# Patient Record
Sex: Female | Born: 1940
Health system: Southern US, Community
[De-identification: ages and names within clinical notes are randomized; demographics above are authoritative.]

## PROBLEM LIST (undated history)

## (undated) DIAGNOSIS — M1990 Unspecified osteoarthritis, unspecified site: Secondary | ICD-10-CM

## (undated) DIAGNOSIS — Z951 Presence of aortocoronary bypass graft: Secondary | ICD-10-CM

## (undated) DIAGNOSIS — C9590 Leukemia, unspecified not having achieved remission: Secondary | ICD-10-CM

## (undated) DIAGNOSIS — M810 Age-related osteoporosis without current pathological fracture: Secondary | ICD-10-CM

## (undated) DIAGNOSIS — Z86010 Personal history of colonic polyps: Secondary | ICD-10-CM

## (undated) DIAGNOSIS — I10 Essential (primary) hypertension: Secondary | ICD-10-CM

## (undated) DIAGNOSIS — R5382 Chronic fatigue, unspecified: Secondary | ICD-10-CM

## (undated) DIAGNOSIS — I2510 Atherosclerotic heart disease of native coronary artery without angina pectoris: Secondary | ICD-10-CM

## (undated) DIAGNOSIS — K219 Gastro-esophageal reflux disease without esophagitis: Secondary | ICD-10-CM

## (undated) DIAGNOSIS — E785 Hyperlipidemia, unspecified: Secondary | ICD-10-CM

## (undated) DIAGNOSIS — I200 Unstable angina: Secondary | ICD-10-CM

## (undated) HISTORY — PX: COLONOSCOPY: SHX174

## (undated) HISTORY — PX: CHOLECYSTECTOMY: SHX55

## (undated) HISTORY — PX: CATARACT EXTRACTION: SUR2

## (undated) HISTORY — PX: ESOPHAGOGASTRODUODENOSCOPY: SHX1529

## (undated) HISTORY — PX: APPENDECTOMY: SHX54

## (undated) HISTORY — DX: Leukemia, unspecified not having achieved remission: C95.90

---

## 2004-01-07 ENCOUNTER — Ambulatory Visit: Payer: Self-pay | Admitting: Internal Medicine

## 2004-01-13 ENCOUNTER — Ambulatory Visit: Payer: Self-pay | Admitting: Internal Medicine

## 2004-09-23 ENCOUNTER — Ambulatory Visit: Payer: Self-pay | Admitting: Obstetrics and Gynecology

## 2005-06-25 ENCOUNTER — Ambulatory Visit: Payer: Self-pay | Admitting: Internal Medicine

## 2005-09-27 ENCOUNTER — Ambulatory Visit: Payer: Self-pay | Admitting: Obstetrics and Gynecology

## 2006-04-25 ENCOUNTER — Ambulatory Visit: Payer: Self-pay | Admitting: Unknown Physician Specialty

## 2006-10-02 ENCOUNTER — Ambulatory Visit: Payer: Self-pay | Admitting: Obstetrics and Gynecology

## 2007-10-04 ENCOUNTER — Ambulatory Visit: Payer: Self-pay | Admitting: Obstetrics and Gynecology

## 2008-10-10 ENCOUNTER — Ambulatory Visit: Payer: Self-pay | Admitting: Obstetrics and Gynecology

## 2008-11-20 ENCOUNTER — Ambulatory Visit: Payer: Self-pay | Admitting: Unknown Physician Specialty

## 2008-12-17 ENCOUNTER — Encounter: Payer: Self-pay | Admitting: Unknown Physician Specialty

## 2009-01-17 ENCOUNTER — Encounter: Payer: Self-pay | Admitting: Unknown Physician Specialty

## 2009-04-20 ENCOUNTER — Observation Stay: Payer: Self-pay | Admitting: Internal Medicine

## 2009-10-12 ENCOUNTER — Ambulatory Visit: Payer: Self-pay | Admitting: Obstetrics and Gynecology

## 2010-11-23 ENCOUNTER — Ambulatory Visit: Payer: Self-pay | Admitting: Obstetrics and Gynecology

## 2011-08-01 ENCOUNTER — Ambulatory Visit: Payer: Self-pay | Admitting: Unknown Physician Specialty

## 2011-08-02 LAB — PATHOLOGY REPORT

## 2011-11-24 ENCOUNTER — Ambulatory Visit: Payer: Self-pay | Admitting: Obstetrics and Gynecology

## 2012-12-02 ENCOUNTER — Ambulatory Visit: Payer: Self-pay | Admitting: Surgery

## 2012-12-02 LAB — CBC
HCT: 38.4 % (ref 35.0–47.0)
HGB: 13.2 g/dL (ref 12.0–16.0)
MCH: 30 pg (ref 26.0–34.0)
MCHC: 34.3 g/dL (ref 32.0–36.0)
Platelet: 271 10*3/uL (ref 150–440)
RBC: 4.39 10*6/uL (ref 3.80–5.20)

## 2012-12-02 LAB — URINALYSIS, COMPLETE
Bacteria: NONE SEEN
Bilirubin,UR: NEGATIVE
Ketone: NEGATIVE
Ph: 7 (ref 4.5–8.0)
Protein: NEGATIVE
WBC UR: <1 /HPF (ref 0–5)

## 2012-12-02 LAB — COMPREHENSIVE METABOLIC PANEL
Albumin: 4.3 g/dL (ref 3.4–5.0)
Alkaline Phosphatase: 113 U/L (ref 50–136)
Anion Gap: 6 — ABNORMAL LOW (ref 7–16)
Co2: 26 mmol/L (ref 21–32)
Creatinine: 0.82 mg/dL (ref 0.60–1.30)
EGFR (African American): >60
Osmolality: 271 (ref 275–301)
Potassium: 3.8 mmol/L (ref 3.5–5.1)
SGPT (ALT): 31 U/L (ref 12–78)
Sodium: 135 mmol/L — ABNORMAL LOW (ref 136–145)
Total Protein: 8.5 g/dL — ABNORMAL HIGH (ref 6.4–8.2)

## 2012-12-04 LAB — PATHOLOGY REPORT

## 2013-09-24 DIAGNOSIS — K2100 Gastro-esophageal reflux disease with esophagitis, without bleeding: Secondary | ICD-10-CM | POA: Insufficient documentation

## 2013-09-24 DIAGNOSIS — E785 Hyperlipidemia, unspecified: Secondary | ICD-10-CM | POA: Insufficient documentation

## 2013-09-24 DIAGNOSIS — M810 Age-related osteoporosis without current pathological fracture: Secondary | ICD-10-CM | POA: Insufficient documentation

## 2013-09-24 DIAGNOSIS — G9332 Myalgic encephalomyelitis/chronic fatigue syndrome: Secondary | ICD-10-CM | POA: Insufficient documentation

## 2013-09-24 DIAGNOSIS — R5382 Chronic fatigue, unspecified: Secondary | ICD-10-CM | POA: Insufficient documentation

## 2013-09-24 DIAGNOSIS — I10 Essential (primary) hypertension: Secondary | ICD-10-CM | POA: Insufficient documentation

## 2014-02-03 DIAGNOSIS — K648 Other hemorrhoids: Secondary | ICD-10-CM | POA: Diagnosis not present

## 2014-03-05 DIAGNOSIS — Z01419 Encounter for gynecological examination (general) (routine) without abnormal findings: Secondary | ICD-10-CM | POA: Diagnosis not present

## 2014-03-06 DIAGNOSIS — Z961 Presence of intraocular lens: Secondary | ICD-10-CM | POA: Diagnosis not present

## 2014-03-07 ENCOUNTER — Ambulatory Visit: Payer: Self-pay | Admitting: Obstetrics and Gynecology

## 2014-03-07 DIAGNOSIS — Z1231 Encounter for screening mammogram for malignant neoplasm of breast: Secondary | ICD-10-CM | POA: Diagnosis not present

## 2014-03-10 DIAGNOSIS — E785 Hyperlipidemia, unspecified: Secondary | ICD-10-CM | POA: Diagnosis not present

## 2014-03-10 DIAGNOSIS — R203 Hyperesthesia: Secondary | ICD-10-CM | POA: Diagnosis not present

## 2014-03-10 DIAGNOSIS — R5382 Chronic fatigue, unspecified: Secondary | ICD-10-CM | POA: Diagnosis not present

## 2014-03-10 DIAGNOSIS — L02212 Cutaneous abscess of back [any part, except buttock]: Secondary | ICD-10-CM | POA: Diagnosis not present

## 2014-03-17 DIAGNOSIS — K648 Other hemorrhoids: Secondary | ICD-10-CM | POA: Diagnosis not present

## 2014-03-19 DIAGNOSIS — R102 Pelvic and perineal pain: Secondary | ICD-10-CM | POA: Diagnosis not present

## 2014-03-20 DIAGNOSIS — E780 Pure hypercholesterolemia: Secondary | ICD-10-CM | POA: Diagnosis not present

## 2014-03-20 DIAGNOSIS — I10 Essential (primary) hypertension: Secondary | ICD-10-CM | POA: Diagnosis not present

## 2014-03-20 DIAGNOSIS — H9193 Unspecified hearing loss, bilateral: Secondary | ICD-10-CM | POA: Diagnosis not present

## 2014-03-20 DIAGNOSIS — Z23 Encounter for immunization: Secondary | ICD-10-CM | POA: Diagnosis not present

## 2014-03-20 DIAGNOSIS — Z0001 Encounter for general adult medical examination with abnormal findings: Secondary | ICD-10-CM | POA: Diagnosis not present

## 2014-03-20 DIAGNOSIS — R5382 Chronic fatigue, unspecified: Secondary | ICD-10-CM | POA: Diagnosis not present

## 2014-04-17 DIAGNOSIS — H26492 Other secondary cataract, left eye: Secondary | ICD-10-CM | POA: Diagnosis not present

## 2014-04-21 DIAGNOSIS — H6123 Impacted cerumen, bilateral: Secondary | ICD-10-CM | POA: Diagnosis not present

## 2014-04-21 DIAGNOSIS — H903 Sensorineural hearing loss, bilateral: Secondary | ICD-10-CM | POA: Diagnosis not present

## 2014-04-30 DIAGNOSIS — M546 Pain in thoracic spine: Secondary | ICD-10-CM | POA: Diagnosis not present

## 2014-05-09 NOTE — Op Note (Signed)
PATIENT NAME:  Sheena Rice, CZERWINSKI MR#:  [id number] DATE OF BIRTH:  1940-11-22  DATE OF PROCEDURE:  12/02/2012  PREOPERATIVE DIAGNOSIS: Acute appendicitis.   POSTOPERATIVE DIAGNOSIS: Acute appendicitis.   PROCEDURE: Laparoscopic appendectomy.   SURGEON: Rochel Brome, M.D.   ANESTHESIA: General.   INDICATIONS: This 74 year old female was admitted emergently with acute onset of abdominal pain with right lower quadrant tenderness, leukocytosis, CT findings of appendicitis. Surgery was recommended for definitive treatment.   DESCRIPTION OF PROCEDURE: The patient was placed on the operating table in the supine position under general endotracheal anesthesia. The abdomen was prepared with ChloraPrep and draped in a sterile manner.   A short incision was made in the inferior aspect of the umbilicus and carried down to the deep fascia which was grasped with laryngeal hook and elevated. A Veress needle was inserted, aspirated and irrigated with a saline solution. Next, the peritoneal cavity was inflated with carbon dioxide. The Veress needle was removed, the 10 mm cannula was inserted. The 10 mm, 0 degrees laparoscope was inserted to view the peritoneal cavity. The abdominal wall was somewhat thin, the liver appeared normal. The cecum was identified. Another incision was made in the left lower quadrant lateral to the inferior epigastric vessels to insert a 12 mm cannula. Another incision was made in the right lower quadrant lateral to the inferior epigastric vessels to insert a 5 mm cannula.   The patient was turned several degrees to the left. The cecum was mobilized with incision of the peritoneal reflection. The cecum was retracted in a cephalad direction, exposing an acutely inflamed appendix. The terminal ileum appeared normal and was adherent to the appendix and these adhesions were taken down with blunt and sharp dissection. A window was created in the mesoappendix adjacent to the junction of the appendix  and the cecum. The blue cartridge Endo GIA stapler was placed across the appendix adjacent to the cecum, engaged and activated,  separating the appendix from the cecum. Staple lines appeared to be hemostatic. Next, the white cartridge and Endo GIA stapler was placed across the mesoappendix, engaged and activated, dividing the mesoappendix. There was just a scant bit of blood momentarily which bled and this was aspirated. Hemostasis was subsequently intact. The appendix was brought out through the 12 mm port site. It did appear to be acutely inflamed but was not ruptured and was submitted in formalin for routine pathology.   Pressure was held over the 12 mm port site, making an air seal and the right lower quadrant was further inspected. Hemostasis was intact. Next, the other cannulas were removed, allowing carbon dioxide to escape from the peritoneal cavity. The fascial defect at the umbilicus and in the left lower quadrant were each repaired with 3-0 Vicryl figure-of-eight sutures. The 3 skin incisions were then closed with interrupted 5-0 chromic subcuticular sutures, benzoin and Steri-Strips. Dressings were applied with paper tape. The patient tolerated surgery satisfactorily and was then moved to the recovery room for postoperative care.   ____________________________ J. Rochel Brome, MD jws:cs D: 12/02/2012 20:26:26 ET T: 12/02/2012 20:33:14 ET JOB#: [id number]  cc: Loreli Dollar, MD, <Dictator> Loreli Dollar MD ELECTRONICALLY SIGNED 12/03/2012 17:14

## 2014-05-12 DIAGNOSIS — L720 Epidermal cyst: Secondary | ICD-10-CM | POA: Diagnosis not present

## 2014-06-14 DIAGNOSIS — N390 Urinary tract infection, site not specified: Secondary | ICD-10-CM | POA: Diagnosis not present

## 2014-09-18 DIAGNOSIS — E780 Pure hypercholesterolemia: Secondary | ICD-10-CM | POA: Diagnosis not present

## 2014-09-18 DIAGNOSIS — I10 Essential (primary) hypertension: Secondary | ICD-10-CM | POA: Diagnosis not present

## 2014-09-25 DIAGNOSIS — R5382 Chronic fatigue, unspecified: Secondary | ICD-10-CM | POA: Diagnosis not present

## 2014-09-25 DIAGNOSIS — E780 Pure hypercholesterolemia: Secondary | ICD-10-CM | POA: Diagnosis not present

## 2014-09-25 DIAGNOSIS — I10 Essential (primary) hypertension: Secondary | ICD-10-CM | POA: Diagnosis not present

## 2014-10-19 DIAGNOSIS — R300 Dysuria: Secondary | ICD-10-CM | POA: Diagnosis not present

## 2014-10-19 DIAGNOSIS — N390 Urinary tract infection, site not specified: Secondary | ICD-10-CM | POA: Diagnosis not present

## 2014-11-17 DIAGNOSIS — L300 Nummular dermatitis: Secondary | ICD-10-CM | POA: Diagnosis not present

## 2014-11-17 DIAGNOSIS — L814 Other melanin hyperpigmentation: Secondary | ICD-10-CM | POA: Diagnosis not present

## 2014-11-17 DIAGNOSIS — L820 Inflamed seborrheic keratosis: Secondary | ICD-10-CM | POA: Diagnosis not present

## 2014-11-17 DIAGNOSIS — D485 Neoplasm of uncertain behavior of skin: Secondary | ICD-10-CM | POA: Diagnosis not present

## 2014-11-17 DIAGNOSIS — D1800 Hemangioma unspecified site: Secondary | ICD-10-CM | POA: Diagnosis not present

## 2014-11-17 DIAGNOSIS — D225 Melanocytic nevi of trunk: Secondary | ICD-10-CM | POA: Diagnosis not present

## 2014-11-17 DIAGNOSIS — Z1283 Encounter for screening for malignant neoplasm of skin: Secondary | ICD-10-CM | POA: Diagnosis not present

## 2014-11-17 DIAGNOSIS — D229 Melanocytic nevi, unspecified: Secondary | ICD-10-CM | POA: Diagnosis not present

## 2014-11-17 DIAGNOSIS — L821 Other seborrheic keratosis: Secondary | ICD-10-CM | POA: Diagnosis not present

## 2014-12-08 DIAGNOSIS — J019 Acute sinusitis, unspecified: Secondary | ICD-10-CM | POA: Diagnosis not present

## 2014-12-26 DIAGNOSIS — J042 Acute laryngotracheitis: Secondary | ICD-10-CM | POA: Diagnosis not present

## 2015-02-05 DIAGNOSIS — Z0100 Encounter for examination of eyes and vision without abnormal findings: Secondary | ICD-10-CM | POA: Diagnosis not present

## 2015-02-05 DIAGNOSIS — H524 Presbyopia: Secondary | ICD-10-CM | POA: Diagnosis not present

## 2015-02-05 DIAGNOSIS — H5210 Myopia, unspecified eye: Secondary | ICD-10-CM | POA: Diagnosis not present

## 2015-03-19 DIAGNOSIS — E7800 Pure hypercholesterolemia, unspecified: Secondary | ICD-10-CM | POA: Diagnosis not present

## 2015-03-19 DIAGNOSIS — R5382 Chronic fatigue, unspecified: Secondary | ICD-10-CM | POA: Diagnosis not present

## 2015-03-26 DIAGNOSIS — E7800 Pure hypercholesterolemia, unspecified: Secondary | ICD-10-CM | POA: Diagnosis not present

## 2015-03-26 DIAGNOSIS — Z0001 Encounter for general adult medical examination with abnormal findings: Secondary | ICD-10-CM | POA: Diagnosis not present

## 2015-03-26 DIAGNOSIS — R5382 Chronic fatigue, unspecified: Secondary | ICD-10-CM | POA: Diagnosis not present

## 2015-03-26 DIAGNOSIS — N390 Urinary tract infection, site not specified: Secondary | ICD-10-CM | POA: Diagnosis not present

## 2015-03-26 DIAGNOSIS — I10 Essential (primary) hypertension: Secondary | ICD-10-CM | POA: Diagnosis not present

## 2015-05-21 ENCOUNTER — Other Ambulatory Visit: Payer: Self-pay | Admitting: Obstetrics and Gynecology

## 2015-05-21 DIAGNOSIS — N6314 Unspecified lump in the right breast, lower inner quadrant: Secondary | ICD-10-CM

## 2015-05-21 DIAGNOSIS — Z01411 Encounter for gynecological examination (general) (routine) with abnormal findings: Secondary | ICD-10-CM | POA: Diagnosis not present

## 2015-05-21 DIAGNOSIS — Z124 Encounter for screening for malignant neoplasm of cervix: Secondary | ICD-10-CM | POA: Diagnosis not present

## 2015-05-28 DIAGNOSIS — M8588 Other specified disorders of bone density and structure, other site: Secondary | ICD-10-CM | POA: Diagnosis not present

## 2015-05-29 ENCOUNTER — Ambulatory Visit
Admission: RE | Admit: 2015-05-29 | Discharge: 2015-05-29 | Disposition: A | Discharge status: Home / Self Care | Payer: Commercial Managed Care - HMO | Source: Ambulatory Visit | Attending: Obstetrics and Gynecology | Admitting: Obstetrics and Gynecology

## 2015-05-29 DIAGNOSIS — N63 Unspecified lump in breast: Secondary | ICD-10-CM | POA: Diagnosis not present

## 2015-05-29 DIAGNOSIS — N6314 Unspecified lump in the right breast, lower inner quadrant: Secondary | ICD-10-CM

## 2015-05-29 DIAGNOSIS — N6489 Other specified disorders of breast: Secondary | ICD-10-CM | POA: Diagnosis not present

## 2015-05-29 DIAGNOSIS — R928 Other abnormal and inconclusive findings on diagnostic imaging of breast: Secondary | ICD-10-CM | POA: Diagnosis not present

## 2015-09-28 DIAGNOSIS — R5382 Chronic fatigue, unspecified: Secondary | ICD-10-CM | POA: Diagnosis not present

## 2015-09-28 DIAGNOSIS — N390 Urinary tract infection, site not specified: Secondary | ICD-10-CM | POA: Diagnosis not present

## 2015-09-28 DIAGNOSIS — I10 Essential (primary) hypertension: Secondary | ICD-10-CM | POA: Diagnosis not present

## 2015-10-08 DIAGNOSIS — H903 Sensorineural hearing loss, bilateral: Secondary | ICD-10-CM | POA: Diagnosis not present

## 2015-10-08 DIAGNOSIS — H6123 Impacted cerumen, bilateral: Secondary | ICD-10-CM | POA: Diagnosis not present

## 2015-11-24 DIAGNOSIS — L816 Other disorders of diminished melanin formation: Secondary | ICD-10-CM | POA: Diagnosis not present

## 2015-11-24 DIAGNOSIS — D229 Melanocytic nevi, unspecified: Secondary | ICD-10-CM | POA: Diagnosis not present

## 2015-11-24 DIAGNOSIS — D1800 Hemangioma unspecified site: Secondary | ICD-10-CM | POA: Diagnosis not present

## 2015-11-24 DIAGNOSIS — B009 Herpesviral infection, unspecified: Secondary | ICD-10-CM | POA: Diagnosis not present

## 2015-11-24 DIAGNOSIS — L820 Inflamed seborrheic keratosis: Secondary | ICD-10-CM | POA: Diagnosis not present

## 2015-11-24 DIAGNOSIS — Z1283 Encounter for screening for malignant neoplasm of skin: Secondary | ICD-10-CM | POA: Diagnosis not present

## 2015-11-24 DIAGNOSIS — L814 Other melanin hyperpigmentation: Secondary | ICD-10-CM | POA: Diagnosis not present

## 2015-11-24 DIAGNOSIS — I788 Other diseases of capillaries: Secondary | ICD-10-CM | POA: Diagnosis not present

## 2015-11-24 DIAGNOSIS — L821 Other seborrheic keratosis: Secondary | ICD-10-CM | POA: Diagnosis not present

## 2015-12-28 ENCOUNTER — Other Ambulatory Visit
Admission: RE | Admit: 2015-12-28 | Discharge: 2015-12-28 | Disposition: A | Discharge status: Home / Self Care | Payer: Commercial Managed Care - HMO | Source: Ambulatory Visit | Attending: Cardiology | Admitting: Cardiology

## 2015-12-28 ENCOUNTER — Other Ambulatory Visit: Payer: Self-pay | Admitting: Cardiology

## 2015-12-28 DIAGNOSIS — E7800 Pure hypercholesterolemia, unspecified: Secondary | ICD-10-CM | POA: Diagnosis not present

## 2015-12-28 DIAGNOSIS — R5382 Chronic fatigue, unspecified: Secondary | ICD-10-CM | POA: Diagnosis not present

## 2015-12-28 DIAGNOSIS — R079 Chest pain, unspecified: Secondary | ICD-10-CM | POA: Insufficient documentation

## 2015-12-28 DIAGNOSIS — I10 Essential (primary) hypertension: Secondary | ICD-10-CM | POA: Diagnosis not present

## 2015-12-28 DIAGNOSIS — Z0000 Encounter for general adult medical examination without abnormal findings: Secondary | ICD-10-CM | POA: Diagnosis not present

## 2015-12-28 LAB — TROPONIN I: TROPONIN I: 0.04 ng/mL — AB (ref <0.03)

## 2015-12-28 MED ORDER — SODIUM CHLORIDE 0.9% FLUSH: 3.0000 mL | Freq: Two times a day (BID) | INTRAVENOUS | Status: AC

## 2015-12-28 MED ORDER — SODIUM CHLORIDE 0.9% FLUSH: 3.0000 mL | INTRAVENOUS | Status: AC | PRN

## 2015-12-28 MED ORDER — ASPIRIN 81 MG PO CHEW: 81.0000 mg | CHEWABLE_TABLET | ORAL | Status: AC

## 2015-12-28 MED ORDER — SODIUM CHLORIDE 0.9 % IV SOLN
250.0000 mL | INTRAVENOUS | Status: AC | PRN
  Administered 2017-04-28: 08:00:00 via INTRAVENOUS

## 2015-12-28 MED ORDER — SODIUM CHLORIDE 0.9 % WEIGHT BASED INFUSION: 3.0000 mL/kg/h | INTRAVENOUS | Status: AC

## 2015-12-28 MED ORDER — SODIUM CHLORIDE 0.9 % WEIGHT BASED INFUSION: 1.0000 mL/kg/h | INTRAVENOUS | Status: AC

## 2015-12-29 ENCOUNTER — Observation Stay
Admission: RE | Admit: 2015-12-29 | Discharge: 2015-12-30 | Disposition: A | Discharge status: Other Acute Inpatient Hospital | Payer: Commercial Managed Care - HMO | Source: Ambulatory Visit | Attending: Cardiology | Admitting: Cardiology

## 2015-12-29 ENCOUNTER — Encounter: Payer: Self-pay | Admitting: *Deleted

## 2015-12-29 ENCOUNTER — Encounter
Admission: RE | Disposition: A | Discharge status: Other Acute Inpatient Hospital | Payer: Self-pay | Source: Ambulatory Visit | Attending: Cardiology

## 2015-12-29 DIAGNOSIS — I25110 Atherosclerotic heart disease of native coronary artery with unstable angina pectoris: Secondary | ICD-10-CM | POA: Diagnosis not present

## 2015-12-29 DIAGNOSIS — I200 Unstable angina: Secondary | ICD-10-CM | POA: Diagnosis not present

## 2015-12-29 DIAGNOSIS — Z79899 Other long term (current) drug therapy: Secondary | ICD-10-CM | POA: Diagnosis not present

## 2015-12-29 DIAGNOSIS — R079 Chest pain, unspecified: Secondary | ICD-10-CM | POA: Diagnosis present

## 2015-12-29 DIAGNOSIS — Z9049 Acquired absence of other specified parts of digestive tract: Secondary | ICD-10-CM | POA: Diagnosis not present

## 2015-12-29 HISTORY — PX: CARDIAC CATHETERIZATION: SHX172

## 2015-12-29 LAB — TROPONIN I: Troponin I: 0.04 ng/mL (ref <0.03)

## 2015-12-29 LAB — CBC
HEMATOCRIT: 37.1 % (ref 35.0–47.0)
Hemoglobin: 12.9 g/dL (ref 12.0–16.0)
MCH: 30.5 pg (ref 26.0–34.0)
MCHC: 34.6 g/dL (ref 32.0–36.0)
MCV: 87.9 fL (ref 80.0–100.0)
Platelets: 222 10*3/uL (ref 150–440)
RBC: 4.22 MIL/uL (ref 3.80–5.20)
RDW: 12.6 % (ref 11.5–14.5)
WBC: 6.2 10*3/uL (ref 3.6–11.0)

## 2015-12-29 LAB — PROTIME-INR
INR: 1.07
Prothrombin Time: 13.9 seconds (ref 11.4–15.2)

## 2015-12-29 LAB — APTT: aPTT: 29 seconds (ref 24–36)

## 2015-12-29 SURGERY — LEFT HEART CATH AND CORONARY ANGIOGRAPHY
Anesthesia: Moderate Sedation | Laterality: Left

## 2015-12-29 MED ORDER — NITROGLYCERIN 0.4 MG SL SUBL
SUBLINGUAL_TABLET | SUBLINGUAL | Status: AC
  Filled 2015-12-29: qty 1

## 2015-12-29 MED ORDER — SODIUM CHLORIDE 0.9 % IV SOLN: 250.0000 mL | INTRAVENOUS | Status: DC | PRN

## 2015-12-29 MED ORDER — ACETAMINOPHEN 325 MG PO TABS: 650.0000 mg | ORAL_TABLET | Freq: Four times a day (QID) | ORAL | Status: DC | PRN

## 2015-12-29 MED ORDER — METOPROLOL TARTRATE 25 MG PO TABS
12.5000 mg | ORAL_TABLET | Freq: Two times a day (BID) | ORAL | Status: DC
  Administered 2015-12-29: 12.5 mg via ORAL
  Filled 2015-12-29 (×2): qty 1

## 2015-12-29 MED ORDER — ALPRAZOLAM 0.5 MG PO TABS: 0.2500 mg | ORAL_TABLET | Freq: Two times a day (BID) | ORAL | Status: DC | PRN

## 2015-12-29 MED ORDER — ZOLPIDEM TARTRATE 5 MG PO TABS: 5.0000 mg | ORAL_TABLET | Freq: Every evening | ORAL | Status: DC | PRN

## 2015-12-29 MED ORDER — FENTANYL CITRATE (PF) 100 MCG/2ML IJ SOLN
INTRAMUSCULAR | Status: DC | PRN
  Administered 2015-12-29: 12.5 ug via INTRAVENOUS

## 2015-12-29 MED ORDER — HEPARIN BOLUS VIA INFUSION
3600.0000 [IU] | Freq: Once | INTRAVENOUS | Status: AC
  Administered 2015-12-29: 3600 [IU] via INTRAVENOUS
  Filled 2015-12-29: qty 3600

## 2015-12-29 MED ORDER — ONDANSETRON HCL 4 MG/2ML IJ SOLN: 4.0000 mg | Freq: Four times a day (QID) | INTRAMUSCULAR | Status: DC | PRN

## 2015-12-29 MED ORDER — IOPAMIDOL (ISOVUE-300) INJECTION 61%
INTRAVENOUS | Status: DC | PRN
Start: 2015-12-29 — End: 2015-12-29
  Administered 2015-12-29: 175 mL via INTRA_ARTERIAL

## 2015-12-29 MED ORDER — SODIUM CHLORIDE 0.9% FLUSH: 3.0000 mL | INTRAVENOUS | Status: DC | PRN

## 2015-12-29 MED ORDER — SODIUM CHLORIDE 0.9 % WEIGHT BASED INFUSION: 1.0000 mL/kg/h | INTRAVENOUS | Status: AC

## 2015-12-29 MED ORDER — SODIUM CHLORIDE 0.9% FLUSH
3.0000 mL | Freq: Two times a day (BID) | INTRAVENOUS | Status: DC
  Administered 2015-12-29: 3 mL via INTRAVENOUS

## 2015-12-29 MED ORDER — ASPIRIN 81 MG PO CHEW
81.0000 mg | CHEWABLE_TABLET | Freq: Once | ORAL | Status: AC
  Administered 2015-12-29: 81 mg via ORAL

## 2015-12-29 MED ORDER — HEPARIN (PORCINE) IN NACL 100-0.45 UNIT/ML-% IJ SOLN
700.0000 [IU]/h | INTRAMUSCULAR | Status: DC
  Administered 2015-12-29: 700 [IU]/h via INTRAVENOUS
  Filled 2015-12-29: qty 250

## 2015-12-29 MED ORDER — ASPIRIN EC 81 MG PO TBEC: 81.0000 mg | DELAYED_RELEASE_TABLET | Freq: Every day | ORAL | Status: DC

## 2015-12-29 MED ORDER — MIDAZOLAM HCL 2 MG/2ML IJ SOLN
INTRAMUSCULAR | Status: DC | PRN
Start: 2015-12-29 — End: 2015-12-29
  Administered 2015-12-29: 1 mg via INTRAVENOUS

## 2015-12-29 MED ORDER — MIDAZOLAM HCL 2 MG/2ML IJ SOLN
INTRAMUSCULAR | Status: AC
  Filled 2015-12-29: qty 2

## 2015-12-29 MED ORDER — SODIUM CHLORIDE 0.9 % IV SOLN
INTRAVENOUS | Status: DC
  Administered 2015-12-29 – 2015-12-30 (×2): via INTRAVENOUS

## 2015-12-29 MED ORDER — FENTANYL CITRATE (PF) 100 MCG/2ML IJ SOLN
INTRAMUSCULAR | Status: AC
  Filled 2015-12-29: qty 2

## 2015-12-29 MED ORDER — HEPARIN (PORCINE) IN NACL 2-0.9 UNIT/ML-% IJ SOLN
INTRAMUSCULAR | Status: AC
  Filled 2015-12-29: qty 500

## 2015-12-29 MED ORDER — ASPIRIN 81 MG PO CHEW
CHEWABLE_TABLET | ORAL | Status: AC
  Filled 2015-12-29: qty 1

## 2015-12-29 MED ORDER — GI COCKTAIL ~~LOC~~
30.0000 mL | Freq: Four times a day (QID) | ORAL | Status: DC | PRN
  Filled 2015-12-29: qty 30

## 2015-12-29 MED ORDER — SODIUM CHLORIDE 0.9 % IV SOLN
INTRAVENOUS | Status: DC
  Administered 2015-12-29: 13:00:00 via INTRAVENOUS

## 2015-12-29 SURGICAL SUPPLY — 13 items
CATH AMP RT 5F (CATHETERS) ×2 IMPLANT
CATH INFINITI 5 FR 3DRC (CATHETERS) ×2 IMPLANT
CATH INFINITI 5 FR MPA2 (CATHETERS) ×2 IMPLANT
CATH INFINITI 5FR ANG PIGTAIL (CATHETERS) ×2 IMPLANT
CATH INFINITI 5FR JL4 (CATHETERS) ×2 IMPLANT
CATH INFINITI JR4 5F (CATHETERS) ×4 IMPLANT
DEVICE CLOSURE MYNXGRIP 5F (Vascular Products) ×2 IMPLANT
KIT MANI 3VAL PERCEP (MISCELLANEOUS) ×3 IMPLANT
NDL PERC 18GX7CM (NEEDLE) IMPLANT
NEEDLE PERC 18GX7CM (NEEDLE) ×3 IMPLANT
PACK CARDIAC CATH (CUSTOM PROCEDURE TRAY) ×3 IMPLANT
SHEATH AVANTI 5FR X 11CM (SHEATH) ×4 IMPLANT
WIRE EMERALD 3MM-J .035X150CM (WIRE) ×2 IMPLANT

## 2015-12-29 NOTE — Progress Notes (Signed)
Report to denise telemetry.  Check right groin for bleeding or hematoma.  Patient will be on bedrest for 1 hours post sheath pull---out of bed at16:50.  Bilateral pulses are 2's DP's.  Patient will transfer to [facility] tomorrow.  Orders to be released in computer

## 2015-12-29 NOTE — H&P (Signed)
History and Physical  Patient ID: Sheena Rice MRN: [id number] DOB/AGE: 06-29-1940 75 y.o. Admit date: 12/29/2015  Primary Care Physician: Madelyn Brunner, MD Primary Cardiologist Dr. Ubaldo Glassing  HPI: Pt is a 75 yo female with no prior cardiac history. Had cardiac cath in 2011 with insignificant disease. Presented to our office with chest pain and underwent left cardiac cath this afternoon. This revealed 3 vessel cad. Pt had chest pain with ekg changes with her cath. Pain resoled with sl nitroglycerin as did her ekg changes. Willl admit for iv heparin and stabilization and recommend transfer to [facility] for consideration for cabg.   Review of systems complete and found to be negative unless listed above  No past medical history on file.  No family history on file.  Social History   Social History  . Marital status: Married    Spouse name: N/A  . Number of children: N/A  . Years of education: N/A   Occupational History  . Not on file.   Social History Main Topics  . Smoking status: Not on file  . Smokeless tobacco: Not on file  . Alcohol use No  . Drug use: No  . Sexual activity: Not on file   Other Topics Concern  . Not on file   Social History Narrative  . No narrative on file    Past Surgical History:  Procedure Laterality Date  . APPENDECTOMY    . CHOLECYSTECTOMY       Prescriptions Prior to Admission  Medication Sig Dispense Refill Last Dose  . Cholecalciferol (VITAMIN D3) 5000 units CAPS Take 1 capsule by mouth daily.   12/28/2015 at Unknown time  . colestipol (COLESTID) 1 g tablet Take 2 tablets by mouth 2 (two) times daily.   12/28/2015 at Unknown time  . cyclobenzaprine (FLEXERIL) 10 MG tablet Take 1 tablet by mouth at bedtime.   12/28/2015 at Unknown time  . docusate sodium (COLACE) 100 MG capsule Take 100 mg by mouth daily.   12/28/2015 at Unknown time  . esomeprazole (NEXIUM) 20 MG capsule Take 1 capsule by mouth every morning.   12/28/2015 at Unknown  time  . Flaxseed, Linseed, (FLAX SEED OIL PO) Take 1,400 mg by mouth daily.   12/28/2015 at Unknown time  . gemfibrozil (LOPID) 600 MG tablet Take 1 tablet by mouth 2 (two) times daily.   12/28/2015 at Unknown time  . Magnesium Oxide 400 MG CAPS Take 1 tablet by mouth 2 (two) times daily.   12/28/2015 at Unknown time  . Probiotic Product (PROBIOTIC DAILY) CAPS Take 1 capsule by mouth daily.   12/28/2015 at Unknown time    Physical Exam: Blood pressure (!) 159/75, pulse 73, temperature 98.3 F (36.8 C), temperature source Oral, resp. rate 15, height 5\' 4"  (1.626 m), weight 132 lb (59.9 kg), SpO2 95 %.   General appearance: alert and cooperative Resp: clear to auscultation bilaterally Cardio: regular rate and rhythm Extremities: extremities normal, atraumatic, no cyanosis or edema Neurologic: Grossly normal Labs:   Lab Results  Component Value Date   WBC 12.1 (H) 12/02/2012   HGB 13.2 12/02/2012   HCT 38.4 12/02/2012   MCV 88 12/02/2012   PLT 271 12/02/2012   No results for input(s): NA, K, CL, CO2, BUN, CREATININE, CALCIUM, PROT, BILITOT, ALKPHOS, ALT, AST, GLUCOSE in the last 168 hours.  Invalid input(s): LABALBU Lab Results  Component Value Date   TROPONINI 0.04 ([place]) 12/28/2015   No results found for: CHOL No results  found for: HDL No results found for: LDLCALC No results found for: TRIG No results found for: CHOLHDL No results found for: LDLDIRECT    Radiology: No results found.  EKG: nsr  ASSESSMENT AND PLAN:  Admit and place on iv heparin, asa, beta blocker. EF normal so no ace inhibitor. Statin intolerant. Follow for ischemia and refer to urgent cabg.   Signed: Teodoro Spray MD, [facility] 12/29/2015, 4:29 PM

## 2015-12-29 NOTE — Consult Note (Signed)
ANTICOAGULATION CONSULT NOTE - Initial Consult  Pharmacy Consult for heparin drip Indication: chest pain/ACS  Allergies  Allergen Reactions  . Pravastatin Other (See Comments)    Muscle pain "STATIN" Drugs    Patient Measurements: Height: 5\' 4"  (162.6 cm) Weight: 132 lb (59.9 kg) IBW/kg (Calculated) : 54.7 Heparin Dosing Weight: 59.9kg  Vital Signs: Temp: 98.3 F (36.8 C) (12/12 1241) Temp Source: Oral (12/12 1241) BP: 173/87 (12/12 1645) Pulse Rate: 75 (12/12 1645)  Labs:  Recent Labs  12/28/15 1047  TROPONINI 0.04*    CrCl cannot be calculated (Patient's most recent lab result is older than the maximum 21 days allowed.).   Medical History: No past medical history on file.  Medications:  Scheduled:  . aspirin      . aspirin EC  81 mg Oral Daily  . heparin  3,600 Units Intravenous Once  . metoprolol tartrate  12.5 mg Oral BID    Assessment: Pt is a 75 year old female who presented to her cardiologist yesterday with chest pain, she underwent cardiac cath today and found to have 3 vessel CAD. Pharmacy consulted to dose heparin drip. Pt to transfer for CABG. Per Dr. Ubaldo Glassing, start heparin drip at 1800.  Goal of Therapy:  Heparin level 0.3-0.7 units/ml Monitor platelets by anticoagulation protocol: Yes   Plan:  Give 3600 units bolus x 1 Start heparin infusion at 700 units/hr Check anti-Xa level in 8 hours and daily while on heparin Continue to monitor H&H and platelets  Ethelwyn Gilbertson D Danaya Geddis, Pharm.D, BCPS Clinical Pharmacist  12/29/2015,4:59 PM

## 2015-12-30 ENCOUNTER — Other Ambulatory Visit: Payer: Self-pay | Admitting: *Deleted

## 2015-12-30 ENCOUNTER — Encounter: Payer: Self-pay | Admitting: Cardiology

## 2015-12-30 ENCOUNTER — Inpatient Hospital Stay (HOSPITAL_COMMUNITY): Payer: Commercial Managed Care - HMO

## 2015-12-30 ENCOUNTER — Inpatient Hospital Stay (HOSPITAL_COMMUNITY)
Admission: AD | Admit: 2015-12-30 | Discharge: 2016-01-05 | DRG: 236 | Disposition: A | Discharge status: Home / Self Care | Payer: Commercial Managed Care - HMO | Source: Other Acute Inpatient Hospital | Attending: Thoracic Surgery (Cardiothoracic Vascular Surgery) | Admitting: Thoracic Surgery (Cardiothoracic Vascular Surgery)

## 2015-12-30 DIAGNOSIS — E874 Mixed disorder of acid-base balance: Secondary | ICD-10-CM | POA: Diagnosis present

## 2015-12-30 DIAGNOSIS — I249 Acute ischemic heart disease, unspecified: Secondary | ICD-10-CM | POA: Diagnosis not present

## 2015-12-30 DIAGNOSIS — Z01810 Encounter for preprocedural cardiovascular examination: Secondary | ICD-10-CM

## 2015-12-30 DIAGNOSIS — Z4682 Encounter for fitting and adjustment of non-vascular catheter: Secondary | ICD-10-CM | POA: Diagnosis not present

## 2015-12-30 DIAGNOSIS — Z8249 Family history of ischemic heart disease and other diseases of the circulatory system: Secondary | ICD-10-CM

## 2015-12-30 DIAGNOSIS — Z09 Encounter for follow-up examination after completed treatment for conditions other than malignant neoplasm: Secondary | ICD-10-CM

## 2015-12-30 DIAGNOSIS — E119 Type 2 diabetes mellitus without complications: Secondary | ICD-10-CM | POA: Diagnosis present

## 2015-12-30 DIAGNOSIS — I200 Unstable angina: Secondary | ICD-10-CM | POA: Diagnosis not present

## 2015-12-30 DIAGNOSIS — D696 Thrombocytopenia, unspecified: Secondary | ICD-10-CM | POA: Diagnosis not present

## 2015-12-30 DIAGNOSIS — I2510 Atherosclerotic heart disease of native coronary artery without angina pectoris: Secondary | ICD-10-CM

## 2015-12-30 DIAGNOSIS — K219 Gastro-esophageal reflux disease without esophagitis: Secondary | ICD-10-CM | POA: Diagnosis present

## 2015-12-30 DIAGNOSIS — R0902 Hypoxemia: Secondary | ICD-10-CM | POA: Diagnosis not present

## 2015-12-30 DIAGNOSIS — K5900 Constipation, unspecified: Secondary | ICD-10-CM | POA: Diagnosis not present

## 2015-12-30 DIAGNOSIS — I10 Essential (primary) hypertension: Secondary | ICD-10-CM | POA: Diagnosis not present

## 2015-12-30 DIAGNOSIS — E8770 Fluid overload, unspecified: Secondary | ICD-10-CM | POA: Diagnosis not present

## 2015-12-30 DIAGNOSIS — Z9689 Presence of other specified functional implants: Secondary | ICD-10-CM

## 2015-12-30 DIAGNOSIS — Z7982 Long term (current) use of aspirin: Secondary | ICD-10-CM

## 2015-12-30 DIAGNOSIS — Z951 Presence of aortocoronary bypass graft: Secondary | ICD-10-CM

## 2015-12-30 DIAGNOSIS — I25110 Atherosclerotic heart disease of native coronary artery with unstable angina pectoris: Secondary | ICD-10-CM | POA: Diagnosis not present

## 2015-12-30 DIAGNOSIS — R5382 Chronic fatigue, unspecified: Secondary | ICD-10-CM | POA: Diagnosis present

## 2015-12-30 DIAGNOSIS — M1990 Unspecified osteoarthritis, unspecified site: Secondary | ICD-10-CM | POA: Diagnosis present

## 2015-12-30 DIAGNOSIS — I4510 Unspecified right bundle-branch block: Secondary | ICD-10-CM | POA: Diagnosis not present

## 2015-12-30 DIAGNOSIS — Z888 Allergy status to other drugs, medicaments and biological substances status: Secondary | ICD-10-CM

## 2015-12-30 DIAGNOSIS — J9811 Atelectasis: Secondary | ICD-10-CM | POA: Diagnosis not present

## 2015-12-30 DIAGNOSIS — Z79899 Other long term (current) drug therapy: Secondary | ICD-10-CM | POA: Diagnosis not present

## 2015-12-30 DIAGNOSIS — J939 Pneumothorax, unspecified: Secondary | ICD-10-CM | POA: Diagnosis not present

## 2015-12-30 DIAGNOSIS — R0602 Shortness of breath: Secondary | ICD-10-CM | POA: Diagnosis not present

## 2015-12-30 DIAGNOSIS — Z01818 Encounter for other preprocedural examination: Secondary | ICD-10-CM | POA: Diagnosis not present

## 2015-12-30 DIAGNOSIS — Z9049 Acquired absence of other specified parts of digestive tract: Secondary | ICD-10-CM | POA: Diagnosis not present

## 2015-12-30 DIAGNOSIS — J90 Pleural effusion, not elsewhere classified: Secondary | ICD-10-CM | POA: Diagnosis not present

## 2015-12-30 DIAGNOSIS — D62 Acute posthemorrhagic anemia: Secondary | ICD-10-CM | POA: Diagnosis not present

## 2015-12-30 HISTORY — DX: Unspecified osteoarthritis, unspecified site: M19.90

## 2015-12-30 HISTORY — DX: Essential (primary) hypertension: I10

## 2015-12-30 HISTORY — DX: Chronic fatigue, unspecified: R53.82

## 2015-12-30 HISTORY — DX: Unstable angina: I20.0

## 2015-12-30 HISTORY — DX: Gastro-esophageal reflux disease without esophagitis: K21.9

## 2015-12-30 HISTORY — DX: Atherosclerotic heart disease of native coronary artery without angina pectoris: I25.10

## 2015-12-30 LAB — CBC
HCT: 38.6 % (ref 35.0–47.0)
HEMATOCRIT: 38.7 % (ref 36.0–46.0)
HEMOGLOBIN: 13.2 g/dL (ref 12.0–15.0)
Hemoglobin: 13.4 g/dL (ref 12.0–16.0)
MCH: 30.7 pg (ref 26.0–34.0)
MCH: 30 pg (ref 26.0–34.0)
MCHC: 34.6 g/dL (ref 32.0–36.0)
MCHC: 34.1 g/dL (ref 30.0–36.0)
MCV: 88.8 fL (ref 80.0–100.0)
MCV: 88 fL (ref 78.0–100.0)
Platelets: 231 10*3/uL (ref 150–440)
Platelets: 235 10*3/uL (ref 150–400)
RBC: 4.35 MIL/uL (ref 3.80–5.20)
RBC: 4.4 MIL/uL (ref 3.87–5.11)
RDW: 13.1 % (ref 11.5–14.5)
RDW: 12.5 % (ref 11.5–15.5)
WBC: 6.3 10*3/uL (ref 3.6–11.0)
WBC: 5.7 10*3/uL (ref 4.0–10.5)

## 2015-12-30 LAB — BASIC METABOLIC PANEL
Anion gap: 7 (ref 5–15)
BUN: 16 mg/dL (ref 6–20)
CALCIUM: 8.9 mg/dL (ref 8.9–10.3)
CO2: 26 mmol/L (ref 22–32)
CREATININE: 0.6 mg/dL (ref 0.44–1.00)
Chloride: 105 mmol/L (ref 101–111)
GFR calc Af Amer: >60 mL/min (ref >60)
Glucose, Bld: 115 mg/dL — ABNORMAL HIGH (ref 65–99)
POTASSIUM: 4 mmol/L (ref 3.5–5.1)
SODIUM: 138 mmol/L (ref 135–145)

## 2015-12-30 LAB — URINALYSIS, ROUTINE W REFLEX MICROSCOPIC
Bilirubin Urine: NEGATIVE
GLUCOSE, UA: NEGATIVE mg/dL
HGB URINE DIPSTICK: NEGATIVE
Ketones, ur: NEGATIVE mg/dL
LEUKOCYTES UA: NEGATIVE
Nitrite: NEGATIVE
PH: 8 (ref 5.0–8.0)
PROTEIN: NEGATIVE mg/dL
SPECIFIC GRAVITY, URINE: 1.005 (ref 1.005–1.030)

## 2015-12-30 LAB — VAS US DOPPLER PRE CABG
LCCADSYS: 96 cm/s
LCCAPSYS: 121 cm/s
LEFT VERTEBRAL DIAS: 25 cm/s
LICADDIAS: -24 cm/s
LICAPDIAS: -31 cm/s
Left CCA dist dias: 24 cm/s
Left CCA prox dias: 19 cm/s
Left ICA dist sys: -125 cm/s
Left ICA prox sys: -128 cm/s
RCCADSYS: -83 cm/s
RCCAPDIAS: 17 cm/s
RIGHT ECA DIAS: -25 cm/s
RIGHT VERTEBRAL DIAS: 13 cm/s
Right CCA prox sys: 106 cm/s

## 2015-12-30 LAB — COMPREHENSIVE METABOLIC PANEL
ALK PHOS: 76 U/L (ref 38–126)
ALT: 37 U/L (ref 14–54)
ANION GAP: 7 (ref 5–15)
AST: 52 U/L — ABNORMAL HIGH (ref 15–41)
Albumin: 3.5 g/dL (ref 3.5–5.0)
BILIRUBIN TOTAL: 0.4 mg/dL (ref 0.3–1.2)
BUN: 11 mg/dL (ref 6–20)
CALCIUM: 9 mg/dL (ref 8.9–10.3)
CO2: 26 mmol/L (ref 22–32)
Chloride: 105 mmol/L (ref 101–111)
Creatinine, Ser: 0.77 mg/dL (ref 0.44–1.00)
Glucose, Bld: 135 mg/dL — ABNORMAL HIGH (ref 65–99)
Potassium: 3.8 mmol/L (ref 3.5–5.1)
Sodium: 138 mmol/L (ref 135–145)
TOTAL PROTEIN: 7 g/dL (ref 6.5–8.1)

## 2015-12-30 LAB — T4, FREE: FREE T4: 0.86 ng/dL (ref 0.61–1.12)

## 2015-12-30 LAB — PROTIME-INR
INR: 1
Prothrombin Time: 13.2 seconds (ref 11.4–15.2)

## 2015-12-30 LAB — HEPARIN LEVEL (UNFRACTIONATED)
HEPARIN UNFRACTIONATED: 0.34 [IU]/mL (ref 0.30–0.70)
Heparin Unfractionated: 0.45 IU/mL (ref 0.30–0.70)

## 2015-12-30 LAB — MAGNESIUM: MAGNESIUM: 1.8 mg/dL (ref 1.7–2.4)

## 2015-12-30 LAB — TSH: TSH: 1.503 u[IU]/mL (ref 0.350–4.500)

## 2015-12-30 LAB — ABO/RH: ABO/RH(D): O POS

## 2015-12-30 MED ORDER — MAGNESIUM SULFATE 50 % IJ SOLN
40.0000 meq | INTRAMUSCULAR | Status: DC
  Filled 2015-12-30: qty 10

## 2015-12-30 MED ORDER — TRANEXAMIC ACID 1000 MG/10ML IV SOLN
1.5000 mg/kg/h | INTRAVENOUS | Status: AC
  Administered 2015-12-31: 1.5 mg/kg/h via INTRAVENOUS
  Filled 2015-12-30: qty 25

## 2015-12-30 MED ORDER — ONDANSETRON HCL 4 MG/2ML IJ SOLN: 4.0000 mg | Freq: Four times a day (QID) | INTRAMUSCULAR | Status: DC | PRN

## 2015-12-30 MED ORDER — NITROGLYCERIN IN D5W 200-5 MCG/ML-% IV SOLN
2.0000 ug/min | INTRAVENOUS | Status: AC
  Administered 2015-12-31: 10 ug/min via INTRAVENOUS
  Filled 2015-12-30: qty 250

## 2015-12-30 MED ORDER — CHLORHEXIDINE GLUCONATE 0.12 % MT SOLN
15.0000 mL | Freq: Once | OROMUCOSAL | Status: AC
  Administered 2015-12-31: 15 mL via OROMUCOSAL
  Filled 2015-12-30: qty 15

## 2015-12-30 MED ORDER — VANCOMYCIN HCL 10 G IV SOLR
1250.0000 mg | INTRAVENOUS | Status: AC
  Administered 2015-12-31: 1250 mg via INTRAVENOUS
  Filled 2015-12-30: qty 1250

## 2015-12-30 MED ORDER — BISACODYL 5 MG PO TBEC
5.0000 mg | DELAYED_RELEASE_TABLET | Freq: Once | ORAL | Status: DC
  Filled 2015-12-30: qty 1

## 2015-12-30 MED ORDER — METOPROLOL TARTRATE 12.5 MG HALF TABLET
12.5000 mg | ORAL_TABLET | Freq: Once | ORAL | Status: AC
  Administered 2015-12-31: 12.5 mg via ORAL
  Filled 2015-12-30: qty 1

## 2015-12-30 MED ORDER — SODIUM CHLORIDE 0.9 % IV SOLN: 250.0000 mL | INTRAVENOUS | 0 refills | Status: DC | PRN

## 2015-12-30 MED ORDER — LORAZEPAM 0.5 MG PO TABS: 0.5000 mg | ORAL_TABLET | ORAL | Status: DC | PRN

## 2015-12-30 MED ORDER — NITROGLYCERIN 0.4 MG SL SUBL: 0.4000 mg | SUBLINGUAL_TABLET | SUBLINGUAL | Status: DC | PRN

## 2015-12-30 MED ORDER — METOPROLOL TARTRATE 12.5 MG HALF TABLET
12.5000 mg | ORAL_TABLET | Freq: Two times a day (BID) | ORAL | Status: DC
  Administered 2015-12-30 (×2): 12.5 mg via ORAL
  Filled 2015-12-30 (×2): qty 1

## 2015-12-30 MED ORDER — POTASSIUM CHLORIDE 2 MEQ/ML IV SOLN
80.0000 meq | INTRAVENOUS | Status: DC
  Filled 2015-12-30: qty 40

## 2015-12-30 MED ORDER — ENOXAPARIN SODIUM 40 MG/0.4ML ~~LOC~~ SOLN: 40.0000 mg | SUBCUTANEOUS | Status: DC

## 2015-12-30 MED ORDER — CHLORHEXIDINE GLUCONATE CLOTH 2 % EX PADS
6.0000 | MEDICATED_PAD | Freq: Once | CUTANEOUS | Status: AC
  Administered 2015-12-30: 6 via TOPICAL

## 2015-12-30 MED ORDER — TEMAZEPAM 15 MG PO CAPS
15.0000 mg | ORAL_CAPSULE | Freq: Once | ORAL | Status: AC | PRN
  Administered 2015-12-30: 15 mg via ORAL
  Filled 2015-12-30: qty 1

## 2015-12-30 MED ORDER — DEXTROSE 5 % IV SOLN
0.0000 ug/min | INTRAVENOUS | Status: DC
  Filled 2015-12-30: qty 4

## 2015-12-30 MED ORDER — PHENYLEPHRINE HCL 10 MG/ML IJ SOLN
30.0000 ug/min | INTRAVENOUS | Status: AC
  Administered 2015-12-31: 25 ug/min via INTRAVENOUS
  Filled 2015-12-30: qty 2

## 2015-12-30 MED ORDER — ASPIRIN 81 MG PO TBEC: 81.0000 mg | DELAYED_RELEASE_TABLET | Freq: Every day | ORAL | 0 refills | Status: AC

## 2015-12-30 MED ORDER — SODIUM CHLORIDE 0.9 % IV SOLN
INTRAVENOUS | Status: DC
  Filled 2015-12-30: qty 30

## 2015-12-30 MED ORDER — ASPIRIN EC 81 MG PO TBEC: 81.0000 mg | DELAYED_RELEASE_TABLET | Freq: Every day | ORAL | Status: DC

## 2015-12-30 MED ORDER — SODIUM CHLORIDE 0.9% FLUSH: 3.0000 mL | Freq: Two times a day (BID) | INTRAVENOUS | 0 refills | Status: DC

## 2015-12-30 MED ORDER — TRANEXAMIC ACID (OHS) PUMP PRIME SOLUTION
2.0000 mg/kg | INTRAVENOUS | Status: DC
  Filled 2015-12-30: qty 1.22

## 2015-12-30 MED ORDER — DEXTROSE 5 % IV SOLN
1.5000 g | INTRAVENOUS | Status: AC
  Administered 2015-12-31: .75 g via INTRAVENOUS
  Administered 2015-12-31: 1.5 g via INTRAVENOUS
  Filled 2015-12-30: qty 1.5

## 2015-12-30 MED ORDER — ACETAMINOPHEN 325 MG PO TABS
650.0000 mg | ORAL_TABLET | ORAL | Status: DC | PRN
Start: 2015-12-30 — End: 2015-12-31

## 2015-12-30 MED ORDER — DEXMEDETOMIDINE HCL IN NACL 400 MCG/100ML IV SOLN
0.1000 ug/kg/h | INTRAVENOUS | Status: AC
  Administered 2015-12-31: 0.7 ug/kg/h via INTRAVENOUS
  Filled 2015-12-30: qty 100

## 2015-12-30 MED ORDER — DEXTROSE 5 % IV SOLN
750.0000 mg | INTRAVENOUS | Status: DC
  Filled 2015-12-30: qty 750

## 2015-12-30 MED ORDER — ASPIRIN 300 MG RE SUPP: 300.0000 mg | RECTAL | Status: AC

## 2015-12-30 MED ORDER — HEPARIN (PORCINE) IN NACL 100-0.45 UNIT/ML-% IJ SOLN: 700.0000 [IU]/h | INTRAMUSCULAR | 0 refills | Status: DC

## 2015-12-30 MED ORDER — CHLORHEXIDINE GLUCONATE CLOTH 2 % EX PADS
6.0000 | MEDICATED_PAD | Freq: Once | CUTANEOUS | Status: AC
  Administered 2015-12-31: 6 via TOPICAL

## 2015-12-30 MED ORDER — PANTOPRAZOLE SODIUM 40 MG PO TBEC
40.0000 mg | DELAYED_RELEASE_TABLET | Freq: Every day | ORAL | Status: DC
  Administered 2015-12-30: 40 mg via ORAL
  Filled 2015-12-30: qty 1

## 2015-12-30 MED ORDER — TRANEXAMIC ACID (OHS) BOLUS VIA INFUSION
15.0000 mg/kg | INTRAVENOUS | Status: AC
  Administered 2015-12-31: 913.5 mg via INTRAVENOUS
  Filled 2015-12-30: qty 914

## 2015-12-30 MED ORDER — HEPARIN (PORCINE) IN NACL 100-0.45 UNIT/ML-% IJ SOLN
700.0000 [IU]/h | INTRAMUSCULAR | Status: DC
Start: 2015-12-30 — End: 2015-12-31
  Filled 2015-12-30: qty 250

## 2015-12-30 MED ORDER — DOPAMINE-DEXTROSE 3.2-5 MG/ML-% IV SOLN
0.0000 ug/kg/min | INTRAVENOUS | Status: AC
  Administered 2015-12-31: 5 ug/kg/min via INTRAVENOUS
  Filled 2015-12-30: qty 250

## 2015-12-30 MED ORDER — METOPROLOL TARTRATE 25 MG PO TABS: 12.5000 mg | ORAL_TABLET | Freq: Two times a day (BID) | ORAL | 0 refills | Status: DC

## 2015-12-30 MED ORDER — INSULIN REGULAR HUMAN 100 UNIT/ML IJ SOLN
INTRAMUSCULAR | Status: AC
  Administered 2015-12-31: 1.9 [IU]/h via INTRAVENOUS
  Filled 2015-12-30: qty 2.5

## 2015-12-30 MED ORDER — PLASMA-LYTE 148 IV SOLN
INTRAVENOUS | Status: AC
  Administered 2015-12-31: 500 mL
  Filled 2015-12-30 (×2): qty 2.5

## 2015-12-30 MED ORDER — ASPIRIN 81 MG PO CHEW
324.0000 mg | CHEWABLE_TABLET | ORAL | Status: AC
  Administered 2015-12-30: 324 mg via ORAL
  Filled 2015-12-30: qty 4

## 2015-12-30 MED ORDER — DIAZEPAM 2 MG PO TABS
2.0000 mg | ORAL_TABLET | Freq: Once | ORAL | Status: AC
  Administered 2015-12-31: 2 mg via ORAL
  Filled 2015-12-30: qty 1

## 2015-12-30 NOTE — Discharge Summary (Signed)
Pt presented for outpatient cardiac cath. This revealed 3 vessel disease including 90% distal rca, 90% proximal lad involving d1, 90%left circumflex involving take off of om2. EF normal. Cath complicated by chest pain with st t wave changes improved with ntg. Placed in observation on iv heparin overnight prior to transfer to [facility] for consideration for cabg.

## 2015-12-30 NOTE — Progress Notes (Signed)
MD notified that patient is here by charge nurse family waiting

## 2015-12-30 NOTE — Progress Notes (Signed)
[id number] Education re importance of IS and walking after surgery discussed. Reviewed sternal precautions. Gave OHS booklet and care guide and wrote down how to view pre op video. Family states that they will be available 24/7 after discharge. Will follow up after surgery. Graylon Good RN BSN 12/30/2015 3:14 PM

## 2015-12-30 NOTE — Progress Notes (Signed)
[facility] for heparin Indication: chest pain/ACS  Allergies  Allergen Reactions  . Pravastatin Other (See Comments)    Muscle pain "STATIN" Drugs    Patient Measurements: Height: 5\' 4"  (162.6 cm) Weight: 134 lb 4.2 oz (60.9 kg) IBW/kg (Calculated) : 54.7 Heparin Dosing Weight: 59.9kg  Vital Signs: Temp: 98 F (36.7 C) (12/13 1224) Temp Source: Oral (12/13 1224) BP: 148/62 (12/13 1224) Pulse Rate: 63 (12/13 1224)  Labs:  Recent Labs  12/28/15 1047  12/29/15 1806 12/30/15 0328 12/30/15 1509  HGB  --   < > 12.9 13.4 13.2  HCT  --   --  37.1 38.6 38.7  PLT  --   --  222 231 235  APTT  --   --  29  --   --   LABPROT  --   --  13.9  --   --   INR  --   --  1.07  --   --   HEPARINUNFRC  --   --   --  0.45 0.34  CREATININE  --   --   --  0.60  --   TROPONINI 0.04*  --  0.04*  --   --   < > = values in this interval not displayed.  Estimated Creatinine Clearance: 53.3 mL/min (by C-G formula based on SCr of 0.6 mg/dL).   Assessment: Pt is a 75 year old female who underwent cardiac cath at [facility] and found to have 3 vessel CAD. Pt transferred from outside hospital with heparin infusion running at 700 units/hr. The infusion was started yesterday ~1800 and this morning's heparin level was therapeutic at 0.45. No bleeding or infusion issues reported during transfer. Will order confirmatory heparin level for this evening.   Heparin level therapeutic this evening  Goal of Therapy:  Heparin level 0.3-0.7 units/ml Monitor platelets by anticoagulation protocol: Yes   Plan:  Continue heparin infusion at 700 units/hr Follow up after heart surgery 12/14  Thank you Anette Guarneri, PharmD [phone] 12/30/2015,3:53 PM

## 2015-12-30 NOTE — Progress Notes (Signed)
Patient vitals stable, no pain. Transporting to [facility] for CABG via [facility]. Pt leaving on continuous heparin drip at 7/hr and normal saline at 50/hr

## 2015-12-30 NOTE — Progress Notes (Signed)
Patient admitted  To room 4w12, VSS, a/o, oriented to room and call bel, safety . IV with Heparin infusing at 7 cc and NS at 50

## 2015-12-30 NOTE — Progress Notes (Signed)
No voiced complaints this shift.  Right groin without active bleed/hematoma.  Heparin infusion continues.

## 2015-12-30 NOTE — Consult Note (Signed)
ANTICOAGULATION CONSULT NOTE - Initial Consult  Pharmacy Consult for heparin drip Indication: chest pain/ACS  Allergies  Allergen Reactions  . Pravastatin Other (See Comments)    Muscle pain "STATIN" Drugs    Patient Measurements: Height: 5\' 4"  (162.6 cm) Weight: 132 lb 14.4 oz (60.3 kg) IBW/kg (Calculated) : 54.7 Heparin Dosing Weight: 59.9kg  Vital Signs: Temp: 98.2 F (36.8 C) (12/12 1947) Temp Source: Oral (12/12 1947) BP: 154/60 (12/12 1947) Pulse Rate: 64 (12/12 1947)  Labs:  Recent Labs  12/28/15 1047 12/29/15 1806 12/30/15 0328  HGB  --  12.9 13.4  HCT  --  37.1 38.6  PLT  --  222 231  APTT  --  29  --   LABPROT  --  13.9  --   INR  --  1.07  --   HEPARINUNFRC  --   --  0.45  CREATININE  --   --  0.60  TROPONINI 0.04* 0.04*  --     Estimated Creatinine Clearance: 53.3 mL/min (by C-G formula based on SCr of 0.6 mg/dL).   Medical History: No past medical history on file.  Medications:  Scheduled:  . aspirin EC  81 mg Oral Daily  . metoprolol tartrate  12.5 mg Oral BID  . sodium chloride flush  3 mL Intravenous Q12H    Assessment: Pt is a 75 year old female who presented to her cardiologist yesterday with chest pain, she underwent cardiac cath today and found to have 3 vessel CAD. Pharmacy consulted to dose heparin drip. Pt to transfer for CABG. Per Dr. Ubaldo Glassing, start heparin drip at 1800.  12/13 03:30 HL resulted at 0.45  Goal of Therapy:  Heparin level 0.3-0.7 units/ml Monitor platelets by anticoagulation protocol: Yes   Plan:  Will continue with current rate of 700 units/hr. Will check a confirmation level in 8 hours.  Paulina Fusi, PharmD, BCPS 12/30/2015 4:27 AM

## 2015-12-30 NOTE — Progress Notes (Signed)
Pre-op Cardiac Surgery  Carotid Findings:  Findings suggest 1-39% internal carotid artery stenosis bilaterally. Vertebral arteries are patent with antegrade flow.  Upper Extremity Right Left  Brachial Pressures 160-Triphasic 172-Triphasic  Radial Waveforms Triphasic Triphasic  Ulnar Waveforms Triphasic Triphasic  Palmar Arch (Allen's Test) Signal obliterates with radial compression, is unaffected with ulnar compression. Signal is unaffected with radial compression, obliterates with ulnar compression.   Lower  Extremity Right Left  Dorsalis Pedis Triphasic Triphasic  Posterior Tibial Triphasic Triphasic    12/30/2015 11:49 AM Maudry Mayhew, BS, RVT, RDCS, RDMS

## 2015-12-30 NOTE — Final Progress Note (Signed)
[facility]  SUBJECTIVE: doing well this am. No further chest pain. Mild discomfort at femoral cath site. No hematoma.    Vitals:   12/29/15 1744 12/29/15 1947 12/30/15 0612 12/30/15 0812  BP: (!) 164/73 (!) 154/60 136/65 (!) 129/52  Pulse: 64 64 63 (!) 59  Resp: 18 18 18    Temp: 98.4 F (36.9 C) 98.2 F (36.8 C) 98.1 F (36.7 C) 98.4 F (36.9 C)  TempSrc: Oral Oral Oral Oral  SpO2: 100% 99% 99% 98%  Weight: 132 lb 14.4 oz (60.3 kg)     Height: 5\' 4"  (1.626 m)       Intake/Output Summary (Last 24 hours) at 12/30/15 0817 Last data filed at 12/30/15 [id number]  Gross per 24 hour  Intake           697.13 ml  Output             1000 ml  Net          -302.87 ml    LABS: Basic Metabolic Panel:  Recent Labs  12/30/15 0328  NA 138  K 4.0  CL 105  CO2 26  GLUCOSE 115*  BUN 16  CREATININE 0.60  CALCIUM 8.9   Liver Function Tests: No results for input(s): AST, ALT, ALKPHOS, BILITOT, PROT, ALBUMIN in the last 72 hours. No results for input(s): LIPASE, AMYLASE in the last 72 hours. CBC:  Recent Labs  12/29/15 1806 12/30/15 0328  WBC 6.2 6.3  HGB 12.9 13.4  HCT 37.1 38.6  MCV 87.9 88.8  PLT 222 231   Cardiac Enzymes:  Recent Labs  12/28/15 1047 12/29/15 1806  TROPONINI 0.04* 0.04*   BNP: Invalid input(s): POCBNP D-Dimer: No results for input(s): DDIMER in the last 72 hours. Hemoglobin A1C: No results for input(s): HGBA1C in the last 72 hours. Fasting Lipid Panel: No results for input(s): CHOL, HDL, LDLCALC, TRIG, CHOLHDL, LDLDIRECT in the last 72 hours. Thyroid Function Tests: No results for input(s): TSH, T4TOTAL, T3FREE, THYROIDAB in the last 72 hours.  Invalid input(s): FREET3 Anemia Panel: No results for input(s): VITAMINB12, FOLATE, FERRITIN, TIBC, IRON, RETICCTPCT in the last 72 hours.   Physical Exam: Blood pressure (!) 129/52, pulse (!) 59, temperature 98.4 F (36.9 C), temperature source Oral, resp. rate  18, height 5\' 4"  (1.626 m), weight 132 lb 14.4 oz (60.3 kg), SpO2 98 %.   Wt Readings from Last 1 Encounters:  12/29/15 132 lb 14.4 oz (60.3 kg)     General appearance: alert and cooperative Head: Normocephalic, without obvious abnormality, atraumatic Resp: clear to auscultation bilaterally Cardio: regular rate and rhythm GI: soft, non-tender; bowel sounds normal; no masses,  no organomegaly Extremities: extremities normal, atraumatic, no cyanosis or edema Neurologic: Grossly normal  TELEMETRY: Reviewed telemetry pt in nsr:  ASSESSMENT AND PLAN:  Active Problems:   Unstable angina (HCC)-pt with 3 vessel cad with preserved lv function with ef greater than 55. On iv heparin with brief episodes of chest pain last pm. Currently pain free. Will add topical nitrates 1 inch 1 6 hours. Will continue with asa, metoprolol. Not  Able to take statins due to intolerance. Phase II cardiac rehab has been ordered. Will transfer to [facility] when bed available for consideration for cabg.     Teodoro Spray, MD, [facility] 12/30/2015 8:17 AM

## 2015-12-30 NOTE — Progress Notes (Signed)
ANTICOAGULATION CONSULT NOTE - Initial Consult  Pharmacy Consult for heparin Indication: chest pain/ACS  Allergies  Allergen Reactions  . Pravastatin Other (See Comments)    Muscle pain "STATIN" Drugs    Patient Measurements: Height: 5\' 4"  (162.6 cm) Weight: 134 lb 4.2 oz (60.9 kg) IBW/kg (Calculated) : 54.7 Heparin Dosing Weight: 59.9kg  Vital Signs: Temp: 98 F (36.7 C) (12/13 1224) Temp Source: Oral (12/13 1224) BP: 148/62 (12/13 1224) Pulse Rate: 63 (12/13 1224)  Labs:  Recent Labs  12/28/15 1047 12/29/15 1806 12/30/15 0328  HGB  --  12.9 13.4  HCT  --  37.1 38.6  PLT  --  222 231  APTT  --  29  --   LABPROT  --  13.9  --   INR  --  1.07  --   HEPARINUNFRC  --   --  0.45  CREATININE  --   --  0.60  TROPONINI 0.04* 0.04*  --     Estimated Creatinine Clearance: 53.3 mL/min (by C-G formula based on SCr of 0.6 mg/dL).   Medical History: Past Medical History:  Diagnosis Date  . Arthritis    OA  . Chronic fatigue   . Coronary artery disease   . GERD (gastroesophageal reflux disease)   . Hypertension   . Unstable angina (HCC)     Medications:  Prescriptions Prior to Admission  Medication Sig Dispense Refill Last Dose  . aspirin EC 81 MG EC tablet Take 1 tablet (81 mg total) by mouth daily. 30 tablet 0 12/29/2015 at Unknown time  . Cholecalciferol (VITAMIN D3) 5000 units CAPS Take 5,000 Units by mouth daily.    Past Week at Unknown time  . colestipol (COLESTID) 1 g tablet Take 2 tablets by mouth 2 (two) times daily.   Past Week at Unknown time  . cyclobenzaprine (FLEXERIL) 10 MG tablet Take 10 mg by mouth at bedtime.    Past Week at Unknown time  . esomeprazole (NEXIUM) 20 MG capsule Take 20 mg by mouth every morning.    Past Week at Unknown time  . Flaxseed, Linseed, (FLAX SEED OIL PO) Take 1,400 mg by mouth daily.   Past Week at Unknown time  . gemfibrozil (LOPID) 600 MG tablet Take 600 mg by mouth 2 (two) times daily.    Past Week at Unknown time  .  Magnesium Oxide 400 MG CAPS Take 400 mg by mouth 2 (two) times daily.    Past Week at Unknown time  . Neomycin-Bacitracin-Polymyxin (TRIPLE ANTIBIOTIC EX) Apply 1 application topically as needed.   Past Week at Unknown time  . Probiotic Product (PROBIOTIC DAILY) CAPS Take 1 capsule by mouth daily.   Past Week at Unknown time  . heparin 100-0.45 UNIT/ML-% infusion Inject 700 Units/hr into the vein continuous. 250 mL 0 Not started at home  . metoprolol tartrate (LOPRESSOR) 25 MG tablet Take 0.5 tablets (12.5 mg total) by mouth 2 (two) times daily. 30 tablet 0 Not started at home yet  . sodium chloride 0.9 % infusion Inject 250 mLs into the vein as needed (for IV line care  (Saline / Heparin Lock)). 10 mL 0 Not started at home yet  . sodium chloride flush (NS) 0.9 % SOLN Inject 3 mLs into the vein every 12 (twelve) hours. 30 mL 0 Not started at home yet    Assessment: Pt is a 75 year old female who underwent cardiac cath at [facility] and found to have 3 vessel CAD. Pt transferred from  outside hospital with heparin infusion running at 700 units/hr. The infusion was started yesterday ~1800 and this morning's heparin level was therapeutic at 0.45. No bleeding or infusion issues reported during transfer. Will order confirmatory heparin level for this evening.   Goal of Therapy:  Heparin level 0.3-0.7 units/ml Monitor platelets by anticoagulation protocol: Yes   Plan:  Continue heparin infusion at 700 units/hr Check anti-Xa level in 8 hours and daily while on heparin Continue to monitor H&H and platelets  Freeland Pracht L Ayline Dingus 12/30/2015,2:23 PM

## 2015-12-30 NOTE — H&P (Signed)
[address]       [place] [address]             [phone]        Thu F Hege Luther Medical Record [id number] Date of Birth: 25-Feb-1940  Referring: No ref. provider found Primary Care: Madelyn Brunner, MD  Chief Complaint:  Chest Pain   History of Present Illness:     This is a pleasant 75 year old female with a past medical history of chronic fatigue syndrome, arthritis, GERD and Hypertension who presented to her PCP outpatient complaining of chest pain for the last few months. In the last few weeks the pain has become more intense, lasted longer, and now radiates to her left arm. She also has some radiation to her right arm, but not as severe. She shares that the pain is sharp but not stabbing. She states it is worse with activity. The pain in her arms began as she was wrapping presents on Friday and she went to see her PCP. She was then sent to Dr. Ubaldo Glassing for a cardiac cath which showed multivessel disease. She was placed on heparin and occassionally still has some faint chest pain when up doing activities. She was transferred from [facility] to [facility] for evaluation for CABG. She does have a family history of coronary artery disease with her two older sisters having stents placed. She is currently pain free.    Current Activity/ Functional Status: Patient is independent with mobility/ambulation, transfers, ADL's, IADL's.   Zubrod Score: At the time of surgery this patient's most appropriate activity status/level should be described as: []     0    Normal activity, no symptoms []     1    Restricted in physical strenuous activity but ambulatory, able to do out light work [x]     2    Ambulatory and capable of self care, unable to do work activities, up and about                 more than 50%  Of the time                            []     3    Only limited self care, in bed greater than 50% of waking hours []     4    Completely disabled, no self care, confined to  bed or chair []     5    Moribund  Past Medical History:  Diagnosis Date  . Arthritis    OA  . Chronic fatigue   . Coronary artery disease   . GERD (gastroesophageal reflux disease)   . Hypertension   . Unstable angina [facility])     Past Surgical History:  Procedure Laterality Date  . APPENDECTOMY    . CARDIAC CATHETERIZATION Left 12/29/2015   Procedure: Left Heart Cath and Coronary Angiography;  Surgeon: Teodoro Spray, MD;  Location: [facility];  Service: Cardiovascular;  Laterality: Left;  . CATARACT EXTRACTION    . CHOLECYSTECTOMY      History  Smoking Status  . Never Smoker  Smokeless Tobacco  . Never Used    History  Alcohol Use No    Social History   Social History  . Marital status: Married    Spouse name: N/A  . Number of children: N/A  . Years of education: N/A   Occupational History  .  Not on file.   Social History Main Topics  . Smoking status: Never Smoker  . Smokeless tobacco: Never Used  . Alcohol use No  . Drug use: No  . Sexual activity: Not on file   Other Topics Concern  . Not on file   Social History Narrative  . No narrative on file    Allergies  Allergen Reactions  . Pravastatin Other (See Comments)    Muscle pain "STATIN" Drugs    Current Facility-Administered Medications  Medication Dose Route Frequency Provider Last Rate Last Dose  . acetaminophen (TYLENOL) tablet 650 mg  650 mg Oral Q4H PRN Elgie Collard, PA-C      . aspirin chewable tablet 324 mg  324 mg Oral NOW Elgie Collard, PA-C       Or  . aspirin suppository 300 mg  300 mg Rectal NOW Elgie Collard, PA-C      . [START ON 12/31/2015] aspirin EC tablet 81 mg  81 mg Oral Daily Tessa N Conte, PA-C      . metoprolol tartrate (LOPRESSOR) tablet 12.5 mg  12.5 mg Oral BID Tessa N Conte, PA-C      . nitroGLYCERIN (NITROSTAT) SL tablet 0.4 mg  0.4 mg Sublingual Q5 Min x 3 PRN Elgie Collard, PA-C      . ondansetron (ZOFRAN) injection 4 mg  4 mg Intravenous Q6H PRN  Elgie Collard, PA-C      . pantoprazole (PROTONIX) EC tablet 40 mg  40 mg Oral Daily Elgie Collard, PA-C       Facility-Administered Medications Ordered in Other Encounters  Medication Dose Route Frequency Provider Last Rate Last Dose  . 0.9 %  sodium chloride infusion  250 mL Intravenous PRN Teodoro Spray, MD      . 0.9% sodium chloride infusion  1 mL/kg/hr Intravenous Continuous Teodoro Spray, MD      . sodium chloride flush (NS) 0.9 % injection 3 mL  3 mL Intravenous Q12H Teodoro Spray, MD      . sodium chloride flush (NS) 0.9 % injection 3 mL  3 mL Intravenous PRN Teodoro Spray, MD        Prescriptions Prior to Admission  Medication Sig Dispense Refill Last Dose  . aspirin EC 81 MG EC tablet Take 1 tablet (81 mg total) by mouth daily. 30 tablet 0 12/29/2015 at Unknown time  . Cholecalciferol (VITAMIN D3) 5000 units CAPS Take 5,000 Units by mouth daily.    Past Week at Unknown time  . colestipol (COLESTID) 1 g tablet Take 2 tablets by mouth 2 (two) times daily.   Past Week at Unknown time  . cyclobenzaprine (FLEXERIL) 10 MG tablet Take 10 mg by mouth at bedtime.    Past Week at Unknown time  . esomeprazole (NEXIUM) 20 MG capsule Take 20 mg by mouth every morning.    Past Week at Unknown time  . Flaxseed, Linseed, (FLAX SEED OIL PO) Take 1,400 mg by mouth daily.   Past Week at Unknown time  . gemfibrozil (LOPID) 600 MG tablet Take 600 mg by mouth 2 (two) times daily.    Past Week at Unknown time  . Magnesium Oxide 400 MG CAPS Take 400 mg by mouth 2 (two) times daily.    Past Week at Unknown time  . Neomycin-Bacitracin-Polymyxin (TRIPLE ANTIBIOTIC EX) Apply 1 application topically as needed.   Past Week at Unknown time  . Probiotic Product (PROBIOTIC DAILY) CAPS Take 1  capsule by mouth daily.   Past Week at Unknown time  . heparin 100-0.45 UNIT/ML-% infusion Inject 700 Units/hr into the vein continuous. 250 mL 0 Not started at home  . metoprolol tartrate (LOPRESSOR) 25 MG tablet Take 0.5  tablets (12.5 mg total) by mouth 2 (two) times daily. 30 tablet 0 Not started at home yet  . sodium chloride 0.9 % infusion Inject 250 mLs into the vein as needed (for IV line care  (Saline / Heparin Lock)). 10 mL 0 Not started at home yet  . sodium chloride flush (NS) 0.9 % SOLN Inject 3 mLs into the vein every 12 (twelve) hours. 30 mL 0 Not started at home yet    History reviewed. No pertinent family history.   Review of Systems:  Pertinent items are noted in HPI.     Cardiac Review of Systems: Y or N  Chest Pain [  x  ]  Resting SOB [   ] Exertional SOB  [  ]  Orthopnea [  ]   Pedal Edema [   ]    Palpitations [  ] Syncope  [  ]   Presyncope [   ]  General Review of Systems: [Y] = yes [  ]=no Constitional: recent weight change [  ]; anorexia [ x ]; fatigue [  ]; nausea [  ]; night sweats [  ]; fever [  ]; or chills [  ]                                                               Dental: poor dentition[  ]; Last Dentist visit:   Eye : blurred vision [  ]; diplopia [   ]; vision changes [  ];  Amaurosis fugax[  ]; Resp: cough [  ];  wheezing[  ];  hemoptysis[  ]; shortness of breath[  ]; paroxysmal nocturnal dyspnea[  ]; dyspnea on exertion[  ]; or orthopnea[  ];  GI:  gallstones[  ], vomiting[  ];  dysphagia[  ]; melena[  ];  hematochezia [  ]; heartburn[  ];   Hx of  Colonoscopy[  ]; GU: kidney stones [  ]; hematuria[  ];   dysuria [  ];  nocturia[  ];  history of     obstruction [  ]; urinary frequency [  ]             Skin: rash, swelling[  ];, hair loss[  ];  peripheral edema[  ];  or itching[  ]; Musculosketetal: myalgias[  ];  joint swelling[  ];  joint erythema[  ];  joint pain[  ];  back pain[  ];  Heme/Lymph: bruising[  ];  bleeding[  ];  anemia[  ];  Neuro: TIA[  ];  headaches[ x ];  stroke[  ];  vertigo[  ];  seizures[  ];   paresthesias[  ];  difficulty walking[  ];  Psych:depression[  ]; anxiety[  ];  Endocrine: diabetes[  ];  thyroid dysfunction[  ];  Immunizations: Flu  [  ]; Pneumococcal[  ];  Other:  Physical Exam: BP (!) 148/62 (BP Location: Left Arm)   Pulse 63   Temp 98 F (36.7 C) (Oral)   Resp 18   Ht 5\' 4"  (  1.626 m)   Wt 134 lb 4.2 oz (60.9 kg)   SpO2 97%   BMI 23.05 kg/m    General appearance: alert, cooperative and no distress Resp: clear to auscultation bilaterally Cardio: regular rate and rhythm GI: soft, non-tender; bowel sounds normal; no masses,  no organomegaly Extremities: extremities normal, atraumatic, no cyanosis or edema    Recent Lab Findings: Lab Results  Component Value Date   WBC 6.3 12/30/2015   HGB 13.4 12/30/2015   HCT 38.6 12/30/2015   PLT 231 12/30/2015   GLUCOSE 115 (H) 12/30/2015   ALT 31 12/02/2012   AST 31 12/02/2012   NA 138 12/30/2015   K 4.0 12/30/2015   CL 105 12/30/2015   CREATININE 0.60 12/30/2015   BUN 16 12/30/2015   CO2 26 12/30/2015   INR 1.07 12/29/2015    Cardiac Cath 12/29/2015   The left ventricular systolic function is normal.  LV end diastolic pressure is normal.  Prox Cx to Mid Cx lesion, 90 %stenosed.  Ost 1st Mrg to 1st Mrg lesion, 90 %stenosed.  Mid LAD lesion, 90 %stenosed.  Ost 2nd Diag to 2nd Diag lesion, 90 %stenosed.  Dist RCA lesion, 95 %stenosed.   3 vessel cad with 99% rca, 90% proximal lad involving bifurcation into D1; 90% mid circumflex lesion involving take off of 0M2.  EF normal  Will need admission for iv heparin and will schedule transfer for consideration for cabg.    Assessment / Plan:   This patient is found to have severe three vessel coronary artery disease. Continue IV heparin. Plan for CABG tomorrow with Dr. Roxan Hockey. All risks and benefits were discussed with the patient. The patient agreed and all questions were answered to her satisfaction.    I  spent 30 minutes counseling the patient face to face and 50% or more the  time was spent in counseling and coordination of care. The total time spent in the appointment was 30  minutes.    @ME1 @ 12/30/2015 2:00 PM  Patient seen and examined. 75 yo woman with no prior hx of CAD who presents with unstable chest pain including a 1.5 hour long episode less than a week ago. At cath has severe 3 vessel CAD with preserved LV function. CABG indicated for survival benefit and relief of symptoms.  I have discussed the general nature of the procedure, the need for general anesthesia, the incisions to be used, and the use of cardiopulmonary bypass with Mrs. Jacquot and her family. We discussed the expected hospital stay, overall recovery and short and long term outcomes. I informed them of the indications, risks, benefits and alternatives. They understand the risks include, but are not limited to death, stroke, MI, DVT/PE, bleeding, possible need for transfusion, infections, cardiac arrhythmias, and other organ system dysfunction including respiratory, renal, or GI complications.   She accepts the risks and agrees to proceed.  For CABG tomorrow AM  Revonda Standard. Roxan Hockey, MD [facility] [phone]

## 2015-12-31 ENCOUNTER — Encounter (HOSPITAL_COMMUNITY)
Admission: AD | Disposition: A | Discharge status: Home / Self Care | Payer: Self-pay | Source: Other Acute Inpatient Hospital | Attending: Thoracic Surgery (Cardiothoracic Vascular Surgery)

## 2015-12-31 ENCOUNTER — Inpatient Hospital Stay (HOSPITAL_COMMUNITY): Payer: Commercial Managed Care - HMO | Admitting: Anesthesiology

## 2015-12-31 ENCOUNTER — Inpatient Hospital Stay (HOSPITAL_COMMUNITY): Payer: Commercial Managed Care - HMO

## 2015-12-31 HISTORY — PX: CORONARY ARTERY BYPASS GRAFT: SHX141

## 2015-12-31 HISTORY — PX: TEE WITHOUT CARDIOVERSION: SHX5443

## 2015-12-31 LAB — CREATININE, SERUM
CREATININE: 0.67 mg/dL (ref 0.44–1.00)
GFR calc Af Amer: >60 mL/min (ref >60)
GFR calc non Af Amer: >60 mL/min (ref >60)

## 2015-12-31 LAB — POCT I-STAT, CHEM 8
BUN: 13 mg/dL (ref 6–20)
BUN: 12 mg/dL (ref 6–20)
BUN: 14 mg/dL (ref 6–20)
BUN: 12 mg/dL (ref 6–20)
BUN: 12 mg/dL (ref 6–20)
BUN: 12 mg/dL (ref 6–20)
BUN: 10 mg/dL (ref 6–20)
CALCIUM ION: 1.08 mmol/L — AB (ref 1.15–1.40)
CALCIUM ION: 1.06 mmol/L — AB (ref 1.15–1.40)
CALCIUM ION: 1.3 mmol/L (ref 1.15–1.40)
CHLORIDE: 106 mmol/L (ref 101–111)
CHLORIDE: 98 mmol/L — AB (ref 101–111)
CHLORIDE: 106 mmol/L (ref 101–111)
CHLORIDE: 110 mmol/L (ref 101–111)
CREATININE: 0.4 mg/dL — AB (ref 0.44–1.00)
Calcium, Ion: 1.21 mmol/L (ref 1.15–1.40)
Calcium, Ion: 0.96 mmol/L — ABNORMAL LOW (ref 1.15–1.40)
Calcium, Ion: 1.22 mmol/L (ref 1.15–1.40)
Calcium, Ion: 1.2 mmol/L (ref 1.15–1.40)
Chloride: 107 mmol/L (ref 101–111)
Chloride: 105 mmol/L (ref 101–111)
Chloride: 105 mmol/L (ref 101–111)
Creatinine, Ser: 0.3 mg/dL — ABNORMAL LOW (ref 0.44–1.00)
Creatinine, Ser: 0.4 mg/dL — ABNORMAL LOW (ref 0.44–1.00)
Creatinine, Ser: 0.4 mg/dL — ABNORMAL LOW (ref 0.44–1.00)
Creatinine, Ser: 0.4 mg/dL — ABNORMAL LOW (ref 0.44–1.00)
Creatinine, Ser: 0.4 mg/dL — ABNORMAL LOW (ref 0.44–1.00)
Creatinine, Ser: 0.5 mg/dL (ref 0.44–1.00)
GLUCOSE: 149 mg/dL — AB (ref 65–99)
GLUCOSE: 143 mg/dL — AB (ref 65–99)
Glucose, Bld: 152 mg/dL — ABNORMAL HIGH (ref 65–99)
Glucose, Bld: 120 mg/dL — ABNORMAL HIGH (ref 65–99)
Glucose, Bld: 146 mg/dL — ABNORMAL HIGH (ref 65–99)
Glucose, Bld: 155 mg/dL — ABNORMAL HIGH (ref 65–99)
Glucose, Bld: 124 mg/dL — ABNORMAL HIGH (ref 65–99)
HCT: 21 % — ABNORMAL LOW (ref 36.0–46.0)
HCT: 24 % — ABNORMAL LOW (ref 36.0–46.0)
HCT: 34 % — ABNORMAL LOW (ref 36.0–46.0)
HEMATOCRIT: 31 % — AB (ref 36.0–46.0)
HEMATOCRIT: 20 % — AB (ref 36.0–46.0)
HEMATOCRIT: 28 % — AB (ref 36.0–46.0)
HEMATOCRIT: 21 % — AB (ref 36.0–46.0)
HEMOGLOBIN: 10.5 g/dL — AB (ref 12.0–15.0)
HEMOGLOBIN: 9.5 g/dL — AB (ref 12.0–15.0)
HEMOGLOBIN: 7.1 g/dL — AB (ref 12.0–15.0)
HEMOGLOBIN: 7.1 g/dL — AB (ref 12.0–15.0)
HEMOGLOBIN: 8.2 g/dL — AB (ref 12.0–15.0)
Hemoglobin: 6.8 g/dL — CL (ref 12.0–15.0)
Hemoglobin: 11.6 g/dL — ABNORMAL LOW (ref 12.0–15.0)
POTASSIUM: 3.5 mmol/L (ref 3.5–5.1)
POTASSIUM: 3.5 mmol/L (ref 3.5–5.1)
POTASSIUM: 3.7 mmol/L (ref 3.5–5.1)
POTASSIUM: 5.4 mmol/L — AB (ref 3.5–5.1)
POTASSIUM: 3.3 mmol/L — AB (ref 3.5–5.1)
Potassium: 5.5 mmol/L — ABNORMAL HIGH (ref 3.5–5.1)
Potassium: 4 mmol/L (ref 3.5–5.1)
SODIUM: 141 mmol/L (ref 135–145)
SODIUM: 135 mmol/L (ref 135–145)
SODIUM: 141 mmol/L (ref 135–145)
SODIUM: 141 mmol/L (ref 135–145)
SODIUM: 141 mmol/L (ref 135–145)
Sodium: 138 mmol/L (ref 135–145)
Sodium: 138 mmol/L (ref 135–145)
TCO2: 24 mmol/L (ref 0–100)
TCO2: 23 mmol/L (ref 0–100)
TCO2: 25 mmol/L (ref 0–100)
TCO2: 23 mmol/L (ref 0–100)
TCO2: 24 mmol/L (ref 0–100)
TCO2: 22 mmol/L (ref 0–100)
TCO2: 22 mmol/L (ref 0–100)

## 2015-12-31 LAB — POCT I-STAT 3, ART BLOOD GAS (G3+)
ACID-BASE DEFICIT: 1 mmol/L (ref 0.0–2.0)
ACID-BASE DEFICIT: 8 mmol/L — AB (ref 0.0–2.0)
ACID-BASE DEFICIT: 3 mmol/L — AB (ref 0.0–2.0)
ACID-BASE DEFICIT: 4 mmol/L — AB (ref 0.0–2.0)
Acid-base deficit: 5 mmol/L — ABNORMAL HIGH (ref 0.0–2.0)
BICARBONATE: 23.4 mmol/L (ref 20.0–28.0)
BICARBONATE: 24.6 mmol/L (ref 20.0–28.0)
BICARBONATE: 22.3 mmol/L (ref 20.0–28.0)
Bicarbonate: 19.1 mmol/L — ABNORMAL LOW (ref 20.0–28.0)
Bicarbonate: 21 mmol/L (ref 20.0–28.0)
O2 Saturation: 100 %
O2 Saturation: 92 %
O2 Saturation: 95 %
O2 Saturation: 97 %
O2 Saturation: 97 %
PCO2 ART: 41.8 mmHg (ref 32.0–48.0)
PO2 ART: 68 mmHg — AB (ref 83.0–108.0)
PO2 ART: 100 mmHg (ref 83.0–108.0)
PO2 ART: 94 mmHg (ref 83.0–108.0)
Patient temperature: 36.3
Patient temperature: 36.3
TCO2: 24 mmol/L (ref 0–100)
TCO2: 20 mmol/L (ref 0–100)
TCO2: 26 mmol/L (ref 0–100)
TCO2: 24 mmol/L (ref 0–100)
TCO2: 22 mmol/L (ref 0–100)
pCO2 arterial: 35.8 mmHg (ref 32.0–48.0)
pCO2 arterial: 40.1 mmHg (ref 32.0–48.0)
pCO2 arterial: 50.8 mmHg — ABNORMAL HIGH (ref 32.0–48.0)
pCO2 arterial: 42.5 mmHg (ref 32.0–48.0)
pH, Arterial: 7.423 (ref 7.350–7.450)
pH, Arterial: 7.277 — ABNORMAL LOW (ref 7.350–7.450)
pH, Arterial: 7.29 — ABNORMAL LOW (ref 7.350–7.450)
pH, Arterial: 7.325 — ABNORMAL LOW (ref 7.350–7.450)
pH, Arterial: 7.306 — ABNORMAL LOW (ref 7.350–7.450)
pO2, Arterial: 305 mmHg — ABNORMAL HIGH (ref 83.0–108.0)
pO2, Arterial: 85 mmHg (ref 83.0–108.0)

## 2015-12-31 LAB — POCT I-STAT 4, (NA,K, GLUC, HGB,HCT)
GLUCOSE: 117 mg/dL — AB (ref 65–99)
HCT: 39 % (ref 36.0–46.0)
Hemoglobin: 13.3 g/dL (ref 12.0–15.0)
POTASSIUM: 3.8 mmol/L (ref 3.5–5.1)
Sodium: 142 mmol/L (ref 135–145)

## 2015-12-31 LAB — CBC
HCT: 36.6 % (ref 36.0–46.0)
HCT: 41.7 % (ref 36.0–46.0)
HCT: 36.3 % (ref 36.0–46.0)
Hemoglobin: 12.1 g/dL (ref 12.0–15.0)
Hemoglobin: 14.6 g/dL (ref 12.0–15.0)
Hemoglobin: 12.4 g/dL (ref 12.0–15.0)
MCH: 29 pg (ref 26.0–34.0)
MCH: 30.9 pg (ref 26.0–34.0)
MCH: 29.4 pg (ref 26.0–34.0)
MCHC: 33.1 g/dL (ref 30.0–36.0)
MCHC: 35 g/dL (ref 30.0–36.0)
MCHC: 34.2 g/dL (ref 30.0–36.0)
MCV: 87.8 fL (ref 78.0–100.0)
MCV: 88.2 fL (ref 78.0–100.0)
MCV: 86 fL (ref 78.0–100.0)
PLATELETS: 219 10*3/uL (ref 150–400)
PLATELETS: 109 10*3/uL — AB (ref 150–400)
Platelets: 98 10*3/uL — ABNORMAL LOW (ref 150–400)
RBC: 4.17 MIL/uL (ref 3.87–5.11)
RBC: 4.73 MIL/uL (ref 3.87–5.11)
RBC: 4.22 MIL/uL (ref 3.87–5.11)
RDW: 12.6 % (ref 11.5–15.5)
RDW: 13 % (ref 11.5–15.5)
RDW: 12.8 % (ref 11.5–15.5)
WBC: 6.1 10*3/uL (ref 4.0–10.5)
WBC: 17.6 10*3/uL — AB (ref 4.0–10.5)
WBC: 13.2 10*3/uL — ABNORMAL HIGH (ref 4.0–10.5)

## 2015-12-31 LAB — PREPARE RBC (CROSSMATCH)

## 2015-12-31 LAB — BASIC METABOLIC PANEL
Anion gap: 8 (ref 5–15)
BUN: 14 mg/dL (ref 6–20)
CALCIUM: 8.8 mg/dL — AB (ref 8.9–10.3)
CO2: 23 mmol/L (ref 22–32)
CREATININE: 0.69 mg/dL (ref 0.44–1.00)
Chloride: 107 mmol/L (ref 101–111)
GFR calc non Af Amer: >60 mL/min (ref >60)
Glucose, Bld: 122 mg/dL — ABNORMAL HIGH (ref 65–99)
Potassium: 4 mmol/L (ref 3.5–5.1)
Sodium: 138 mmol/L (ref 135–145)

## 2015-12-31 LAB — GLUCOSE, CAPILLARY
GLUCOSE-CAPILLARY: 62 mg/dL — AB (ref 65–99)
GLUCOSE-CAPILLARY: 184 mg/dL — AB (ref 65–99)
GLUCOSE-CAPILLARY: 132 mg/dL — AB (ref 65–99)
GLUCOSE-CAPILLARY: 114 mg/dL — AB (ref 65–99)
GLUCOSE-CAPILLARY: 122 mg/dL — AB (ref 65–99)
GLUCOSE-CAPILLARY: 176 mg/dL — AB (ref 65–99)
GLUCOSE-CAPILLARY: 122 mg/dL — AB (ref 65–99)
Glucose-Capillary: 235 mg/dL — ABNORMAL HIGH (ref 65–99)
Glucose-Capillary: 48 mg/dL — ABNORMAL LOW (ref 65–99)
Glucose-Capillary: 151 mg/dL — ABNORMAL HIGH (ref 65–99)

## 2015-12-31 LAB — PROTIME-INR
INR: 1.37
PROTHROMBIN TIME: 17 s — AB (ref 11.4–15.2)

## 2015-12-31 LAB — HEMOGLOBIN A1C
Hgb A1c MFr Bld: 5.9 % — ABNORMAL HIGH (ref 4.8–5.6)
MEAN PLASMA GLUCOSE: 123 mg/dL

## 2015-12-31 LAB — PLATELET COUNT: Platelets: 134 10*3/uL — ABNORMAL LOW (ref 150–400)

## 2015-12-31 LAB — MAGNESIUM: MAGNESIUM: 2.8 mg/dL — AB (ref 1.7–2.4)

## 2015-12-31 LAB — APTT: APTT: 29 s (ref 24–36)

## 2015-12-31 LAB — MRSA PCR SCREENING: MRSA BY PCR: NEGATIVE

## 2015-12-31 LAB — HEMOGLOBIN AND HEMATOCRIT, BLOOD
HEMATOCRIT: 21.4 % — AB (ref 36.0–46.0)
Hemoglobin: 7.4 g/dL — ABNORMAL LOW (ref 12.0–15.0)

## 2015-12-31 SURGERY — CORONARY ARTERY BYPASS GRAFTING (CABG)
Anesthesia: General | Site: Chest

## 2015-12-31 MED ORDER — INSULIN REGULAR BOLUS VIA INFUSION
0.0000 [IU] | Freq: Three times a day (TID) | INTRAVENOUS | Status: DC
  Filled 2015-12-31: qty 10

## 2015-12-31 MED ORDER — HEMOSTATIC AGENTS (NO CHARGE) OPTIME
TOPICAL | Status: DC | PRN
  Administered 2015-12-31: 1 via TOPICAL

## 2015-12-31 MED ORDER — METOCLOPRAMIDE HCL 5 MG/ML IJ SOLN
10.0000 mg | Freq: Four times a day (QID) | INTRAMUSCULAR | Status: AC
  Administered 2015-12-31 – 2016-01-01 (×3): 10 mg via INTRAVENOUS
  Filled 2015-12-31 (×3): qty 2

## 2015-12-31 MED ORDER — NITROGLYCERIN IN D5W 200-5 MCG/ML-% IV SOLN: 0.0000 ug/min | INTRAVENOUS | Status: DC

## 2015-12-31 MED ORDER — CHLORHEXIDINE GLUCONATE 0.12 % MT SOLN
15.0000 mL | OROMUCOSAL | Status: AC
  Administered 2015-12-31: 15 mL via OROMUCOSAL

## 2015-12-31 MED ORDER — DOCUSATE SODIUM 100 MG PO CAPS
200.0000 mg | ORAL_CAPSULE | Freq: Every day | ORAL | Status: DC
  Filled 2015-12-31: qty 2

## 2015-12-31 MED ORDER — FENTANYL CITRATE (PF) 250 MCG/5ML IJ SOLN
INTRAMUSCULAR | Status: AC
  Filled 2015-12-31: qty 20

## 2015-12-31 MED ORDER — DOPAMINE-DEXTROSE 3.2-5 MG/ML-% IV SOLN
0.0000 ug/kg/min | INTRAVENOUS | Status: DC
  Administered 2015-12-31: 5 ug/kg/min via INTRAVENOUS
  Filled 2015-12-31: qty 250

## 2015-12-31 MED ORDER — ACETAMINOPHEN 500 MG PO TABS
1000.0000 mg | ORAL_TABLET | Freq: Four times a day (QID) | ORAL | Status: DC
  Administered 2015-12-31 – 2016-01-01 (×4): 1000 mg via ORAL
  Filled 2015-12-31 (×5): qty 2

## 2015-12-31 MED ORDER — DEXTROSE 5 % IV SOLN
1.5000 g | Freq: Two times a day (BID) | INTRAVENOUS | Status: AC
  Administered 2015-12-31 – 2016-01-02 (×4): 1.5 g via INTRAVENOUS
  Filled 2015-12-31 (×4): qty 1.5

## 2015-12-31 MED ORDER — VANCOMYCIN HCL IN DEXTROSE 1-5 GM/200ML-% IV SOLN
1000.0000 mg | Freq: Once | INTRAVENOUS | Status: AC
  Administered 2015-12-31: 1000 mg via INTRAVENOUS
  Filled 2015-12-31: qty 200

## 2015-12-31 MED ORDER — SODIUM CHLORIDE 0.9 % IJ SOLN
OROMUCOSAL | Status: DC | PRN
  Administered 2015-12-31 (×3): 4 mL via TOPICAL

## 2015-12-31 MED ORDER — PROPOFOL 10 MG/ML IV BOLUS
INTRAVENOUS | Status: DC | PRN
  Administered 2015-12-31 (×2): 40 mg via INTRAVENOUS

## 2015-12-31 MED ORDER — SODIUM BICARBONATE 8.4 % IV SOLN
100.0000 meq | Freq: Once | INTRAVENOUS | Status: AC
  Administered 2015-12-31: 100 meq via INTRAVENOUS

## 2015-12-31 MED ORDER — MORPHINE SULFATE (PF) 2 MG/ML IV SOLN: 1.0000 mg | INTRAVENOUS | Status: AC | PRN

## 2015-12-31 MED ORDER — LACTATED RINGERS IV SOLN
500.0000 mL | Freq: Once | INTRAVENOUS | Status: AC | PRN
  Administered 2015-12-31: 500 mL via INTRAVENOUS

## 2015-12-31 MED ORDER — ACETAMINOPHEN 160 MG/5ML PO SOLN: 1000.0000 mg | Freq: Four times a day (QID) | ORAL | Status: DC

## 2015-12-31 MED ORDER — MORPHINE SULFATE (PF) 2 MG/ML IV SOLN
2.0000 mg | INTRAVENOUS | Status: DC | PRN
  Administered 2015-12-31 – 2016-01-01 (×6): 2 mg via INTRAVENOUS
  Filled 2015-12-31 (×6): qty 1

## 2015-12-31 MED ORDER — CHLORHEXIDINE GLUCONATE 0.12% ORAL RINSE (MEDLINE KIT)
15.0000 mL | Freq: Two times a day (BID) | OROMUCOSAL | Status: DC
Start: 2015-12-31 — End: 2016-01-01
  Administered 2015-12-31 – 2016-01-01 (×2): 15 mL via OROMUCOSAL

## 2015-12-31 MED ORDER — COLESTIPOL HCL 1 G PO TABS
2.0000 g | ORAL_TABLET | Freq: Two times a day (BID) | ORAL | Status: DC
  Administered 2016-01-01 – 2016-01-05 (×8): 2 g via ORAL
  Filled 2015-12-31 (×10): qty 2

## 2015-12-31 MED ORDER — SODIUM CHLORIDE 0.9 % IV SOLN
30.0000 meq | Freq: Once | INTRAVENOUS | Status: AC
  Administered 2015-12-31: 30 meq via INTRAVENOUS
  Filled 2015-12-31: qty 15

## 2015-12-31 MED ORDER — ACETAMINOPHEN 160 MG/5ML PO SOLN: 650.0000 mg | Freq: Once | ORAL | Status: AC

## 2015-12-31 MED ORDER — EPINEPHRINE PF 1 MG/10ML IJ SOSY
PREFILLED_SYRINGE | INTRAMUSCULAR | Status: DC | PRN
  Administered 2015-12-31 (×2): 8 ug via INTRAVENOUS

## 2015-12-31 MED ORDER — BISACODYL 10 MG RE SUPP: 10.0000 mg | Freq: Every day | RECTAL | Status: DC

## 2015-12-31 MED ORDER — PANTOPRAZOLE SODIUM 40 MG PO TBEC: 40.0000 mg | DELAYED_RELEASE_TABLET | Freq: Every day | ORAL | Status: DC

## 2015-12-31 MED ORDER — PLASMA-LYTE 148 IV SOLN
INTRAVENOUS | Status: DC | PRN
  Administered 2015-12-31: 500 mL via INTRAVASCULAR

## 2015-12-31 MED ORDER — MAGNESIUM OXIDE 400 (241.3 MG) MG PO TABS
400.0000 mg | ORAL_TABLET | Freq: Two times a day (BID) | ORAL | Status: DC
  Administered 2016-01-01 – 2016-01-05 (×8): 400 mg via ORAL
  Filled 2015-12-31 (×10): qty 1

## 2015-12-31 MED ORDER — MAGNESIUM SULFATE 4 GM/100ML IV SOLN
4.0000 g | Freq: Once | INTRAVENOUS | Status: AC
  Administered 2015-12-31: 4 g via INTRAVENOUS
  Filled 2015-12-31: qty 100

## 2015-12-31 MED ORDER — LACTATED RINGERS IV SOLN
INTRAVENOUS | Status: DC
  Administered 2015-12-31 (×2): via INTRAVENOUS

## 2015-12-31 MED ORDER — MIDAZOLAM HCL 10 MG/2ML IJ SOLN
INTRAMUSCULAR | Status: AC
  Filled 2015-12-31: qty 2

## 2015-12-31 MED ORDER — ASPIRIN 81 MG PO CHEW: 324.0000 mg | CHEWABLE_TABLET | Freq: Every day | ORAL | Status: DC

## 2015-12-31 MED ORDER — ROCURONIUM BROMIDE 50 MG/5ML IV SOSY
PREFILLED_SYRINGE | INTRAVENOUS | Status: AC
  Filled 2015-12-31: qty 15

## 2015-12-31 MED ORDER — ASPIRIN EC 325 MG PO TBEC
325.0000 mg | DELAYED_RELEASE_TABLET | Freq: Every day | ORAL | Status: DC
  Filled 2015-12-31: qty 1

## 2015-12-31 MED ORDER — ALBUMIN HUMAN 5 % IV SOLN
250.0000 mL | INTRAVENOUS | Status: AC | PRN
  Administered 2015-12-31 (×4): 250 mL via INTRAVENOUS
  Filled 2015-12-31 (×2): qty 250

## 2015-12-31 MED ORDER — ALBUMIN HUMAN 5 % IV SOLN
INTRAVENOUS | Status: DC | PRN
  Administered 2015-12-31: 13:00:00 via INTRAVENOUS

## 2015-12-31 MED ORDER — SODIUM CHLORIDE 0.9% FLUSH
3.0000 mL | Freq: Two times a day (BID) | INTRAVENOUS | Status: DC
  Administered 2016-01-01: 3 mL via INTRAVENOUS

## 2015-12-31 MED ORDER — LACTATED RINGERS IV SOLN
INTRAVENOUS | Status: DC
Start: 2015-12-31 — End: 2016-01-02
  Administered 2015-12-31: 15:00:00 via INTRAVENOUS

## 2015-12-31 MED ORDER — PHENYLEPHRINE HCL 10 MG/ML IJ SOLN
INTRAVENOUS | Status: DC | PRN
  Administered 2015-12-31: 20 ug/min via INTRAVENOUS

## 2015-12-31 MED ORDER — BISACODYL 5 MG PO TBEC
10.0000 mg | DELAYED_RELEASE_TABLET | Freq: Every day | ORAL | Status: DC
  Administered 2016-01-01: 10 mg via ORAL
  Filled 2015-12-31: qty 2

## 2015-12-31 MED ORDER — ONDANSETRON HCL 4 MG/2ML IJ SOLN
4.0000 mg | Freq: Four times a day (QID) | INTRAMUSCULAR | Status: DC | PRN
  Administered 2015-12-31 – 2016-01-01 (×2): 4 mg via INTRAVENOUS
  Filled 2015-12-31 (×2): qty 2

## 2015-12-31 MED ORDER — DEXTROSE 50 % IV SOLN
50.0000 mL | Freq: Once | INTRAVENOUS | Status: AC
  Administered 2015-12-31: 50 mL via INTRAVENOUS

## 2015-12-31 MED ORDER — SODIUM CHLORIDE 0.9 % IV SOLN
INTRAVENOUS | Status: DC
  Administered 2015-12-31: 15:00:00 via INTRAVENOUS

## 2015-12-31 MED ORDER — DEXMEDETOMIDINE HCL IN NACL 200 MCG/50ML IV SOLN: 0.0000 ug/kg/h | INTRAVENOUS | Status: DC

## 2015-12-31 MED ORDER — HEPARIN SODIUM (PORCINE) 1000 UNIT/ML IJ SOLN
INTRAMUSCULAR | Status: DC | PRN
  Administered 2015-12-31: 18000 [IU] via INTRAVENOUS
  Administered 2015-12-31: 2000 [IU] via INTRAVENOUS

## 2015-12-31 MED ORDER — HEPARIN SODIUM (PORCINE) 1000 UNIT/ML IJ SOLN
INTRAMUSCULAR | Status: AC
  Filled 2015-12-31: qty 1

## 2015-12-31 MED ORDER — METOPROLOL TARTRATE 5 MG/5ML IV SOLN
2.5000 mg | INTRAVENOUS | Status: DC | PRN
  Administered 2016-01-01: 5 mg via INTRAVENOUS
  Administered 2016-01-01: 2.5 mg via INTRAVENOUS
  Filled 2015-12-31 (×3): qty 5

## 2015-12-31 MED ORDER — CALCIUM CHLORIDE 10 % IV SOLN
INTRAVENOUS | Status: DC | PRN
  Administered 2015-12-31: 1000 mg via INTRAVENOUS

## 2015-12-31 MED ORDER — LACTATED RINGERS IV SOLN
INTRAVENOUS | Status: DC | PRN
  Administered 2015-12-31 (×4): via INTRAVENOUS

## 2015-12-31 MED ORDER — PROPOFOL 10 MG/ML IV BOLUS
INTRAVENOUS | Status: AC
  Filled 2015-12-31: qty 20

## 2015-12-31 MED ORDER — PROTAMINE SULFATE 10 MG/ML IV SOLN
INTRAVENOUS | Status: AC
  Filled 2015-12-31: qty 25

## 2015-12-31 MED ORDER — MIDAZOLAM HCL 2 MG/2ML IJ SOLN: 2.0000 mg | INTRAMUSCULAR | Status: DC | PRN

## 2015-12-31 MED ORDER — SODIUM CHLORIDE 0.9% FLUSH: 3.0000 mL | INTRAVENOUS | Status: DC | PRN

## 2015-12-31 MED ORDER — PROTAMINE SULFATE 10 MG/ML IV SOLN
INTRAVENOUS | Status: DC | PRN
  Administered 2015-12-31: 30 mg via INTRAVENOUS
  Administered 2015-12-31: 50 mg via INTRAVENOUS
  Administered 2015-12-31: 30 mg via INTRAVENOUS
  Administered 2015-12-31: 10 mg via INTRAVENOUS
  Administered 2015-12-31: 50 mg via INTRAVENOUS
  Administered 2015-12-31: 10 mg via INTRAVENOUS

## 2015-12-31 MED ORDER — GEMFIBROZIL 600 MG PO TABS
600.0000 mg | ORAL_TABLET | Freq: Two times a day (BID) | ORAL | Status: DC
  Administered 2016-01-01 – 2016-01-05 (×8): 600 mg via ORAL
  Filled 2015-12-31 (×10): qty 1

## 2015-12-31 MED ORDER — ORAL CARE MOUTH RINSE
15.0000 mL | Freq: Four times a day (QID) | OROMUCOSAL | Status: DC
  Administered 2015-12-31 – 2016-01-01 (×4): 15 mL via OROMUCOSAL

## 2015-12-31 MED ORDER — MIDAZOLAM HCL 5 MG/ML IJ SOLN
INTRAMUSCULAR | Status: DC | PRN
  Administered 2015-12-31: 3 mg via INTRAVENOUS
  Administered 2015-12-31: 5 mg via INTRAVENOUS
  Administered 2015-12-31: 2 mg via INTRAVENOUS

## 2015-12-31 MED ORDER — METOPROLOL TARTRATE 25 MG/10 ML ORAL SUSPENSION: 12.5000 mg | Freq: Two times a day (BID) | ORAL | Status: DC

## 2015-12-31 MED ORDER — FENTANYL CITRATE (PF) 250 MCG/5ML IJ SOLN
INTRAMUSCULAR | Status: DC | PRN
  Administered 2015-12-31 (×2): 250 ug via INTRAVENOUS
  Administered 2015-12-31: 500 ug via INTRAVENOUS

## 2015-12-31 MED ORDER — TRAMADOL HCL 50 MG PO TABS: 50.0000 mg | ORAL_TABLET | ORAL | Status: DC | PRN

## 2015-12-31 MED ORDER — SODIUM CHLORIDE 0.9 % IV SOLN
INTRAVENOUS | Status: DC
  Administered 2015-12-31: 18:00:00 via INTRAVENOUS
  Filled 2015-12-31 (×3): qty 2.5

## 2015-12-31 MED ORDER — SODIUM CHLORIDE 0.9 % IV SOLN: 250.0000 mL | INTRAVENOUS | Status: DC

## 2015-12-31 MED ORDER — ARTIFICIAL TEARS OP OINT
TOPICAL_OINTMENT | OPHTHALMIC | Status: AC
  Filled 2015-12-31: qty 3.5

## 2015-12-31 MED ORDER — ACETAMINOPHEN 650 MG RE SUPP
650.0000 mg | Freq: Once | RECTAL | Status: AC
  Administered 2015-12-31: 650 mg via RECTAL

## 2015-12-31 MED ORDER — METOPROLOL TARTRATE 12.5 MG HALF TABLET
12.5000 mg | ORAL_TABLET | Freq: Two times a day (BID) | ORAL | Status: DC
  Administered 2016-01-01: 12.5 mg via ORAL
  Filled 2015-12-31: qty 1

## 2015-12-31 MED ORDER — 0.9 % SODIUM CHLORIDE (POUR BTL) OPTIME
TOPICAL | Status: DC | PRN
  Administered 2015-12-31: 6000 mL

## 2015-12-31 MED ORDER — PHENYLEPHRINE HCL 10 MG/ML IJ SOLN
0.0000 ug/min | INTRAVENOUS | Status: DC
  Administered 2015-12-31: 28 ug/min via INTRAVENOUS
  Filled 2015-12-31 (×2): qty 2

## 2015-12-31 MED ORDER — LIDOCAINE HCL (CARDIAC) 20 MG/ML IV SOLN
INTRAVENOUS | Status: DC | PRN
  Administered 2015-12-31: 50 mg via INTRATRACHEAL

## 2015-12-31 MED ORDER — SODIUM CHLORIDE 0.45 % IV SOLN
INTRAVENOUS | Status: DC | PRN
  Administered 2015-12-31: 15:00:00 via INTRAVENOUS

## 2015-12-31 MED ORDER — ROCURONIUM BROMIDE 100 MG/10ML IV SOLN
INTRAVENOUS | Status: DC | PRN
  Administered 2015-12-31 (×3): 50 mg via INTRAVENOUS

## 2015-12-31 MED ORDER — OXYCODONE HCL 5 MG PO TABS
5.0000 mg | ORAL_TABLET | ORAL | Status: DC | PRN
  Administered 2016-01-01: 5 mg via ORAL
  Filled 2015-12-31: qty 1

## 2015-12-31 MED ORDER — FAMOTIDINE IN NACL 20-0.9 MG/50ML-% IV SOLN
20.0000 mg | Freq: Two times a day (BID) | INTRAVENOUS | Status: AC
  Administered 2015-12-31: 20 mg via INTRAVENOUS

## 2015-12-31 MED FILL — Magnesium Sulfate Inj 50%: INTRAMUSCULAR | Qty: 2 | Status: AC

## 2015-12-31 MED FILL — Lidocaine HCl IV Inj 20 MG/ML: INTRAVENOUS | Qty: 5 | Status: AC

## 2015-12-31 MED FILL — Potassium Chloride Inj 2 mEq/ML: INTRAVENOUS | Qty: 40 | Status: AC

## 2015-12-31 MED FILL — Sodium Chloride IV Soln 0.9%: INTRAVENOUS | Qty: 2000 | Status: AC

## 2015-12-31 MED FILL — Albumin, Human Inj 5%: INTRAVENOUS | Qty: 250 | Status: AC

## 2015-12-31 MED FILL — Mannitol IV Soln 20%: INTRAVENOUS | Qty: 500 | Status: AC

## 2015-12-31 MED FILL — Electrolyte-R (PH 7.4) Solution: INTRAVENOUS | Qty: 4000 | Status: AC

## 2015-12-31 MED FILL — Heparin Sodium (Porcine) Inj 1000 Unit/ML: INTRAMUSCULAR | Qty: 10 | Status: AC

## 2015-12-31 MED FILL — Heparin Sodium (Porcine) Inj 1000 Unit/ML: INTRAMUSCULAR | Qty: 30 | Status: AC

## 2015-12-31 MED FILL — Sodium Bicarbonate IV Soln 8.4%: INTRAVENOUS | Qty: 50 | Status: AC

## 2015-12-31 SURGICAL SUPPLY — 94 items
ADH SKN CLS APL DERMABOND .7 (GAUZE/BANDAGES/DRESSINGS) ×2
BAG DECANTER FOR FLEXI CONT (MISCELLANEOUS) ×4 IMPLANT
BANDAGE ACE 4X5 VEL STRL LF (GAUZE/BANDAGES/DRESSINGS) ×4 IMPLANT
BANDAGE ACE 6X5 VEL STRL LF (GAUZE/BANDAGES/DRESSINGS) ×4 IMPLANT
BASKET HEART  (ORDER IN 25'S) (MISCELLANEOUS) ×1
BASKET HEART (ORDER IN 25'S) (MISCELLANEOUS) ×1
BASKET HEART (ORDER IN 25S) (MISCELLANEOUS) ×2 IMPLANT
BLADE STERNUM SYSTEM 6 (BLADE) ×4 IMPLANT
BLADE SURG 11 STRL SS (BLADE) ×2 IMPLANT
BNDG GAUZE ELAST 4 BULKY (GAUZE/BANDAGES/DRESSINGS) ×4 IMPLANT
CANISTER SUCTION 2500CC (MISCELLANEOUS) ×4 IMPLANT
CANNULA EZ GLIDE AORTIC 21FR (CANNULA) ×4 IMPLANT
CATH CPB KIT HENDRICKSON (MISCELLANEOUS) ×4 IMPLANT
CATH ROBINSON RED A/P 18FR (CATHETERS) ×4 IMPLANT
CATH THORACIC 36FR (CATHETERS) ×4 IMPLANT
CATH THORACIC 36FR RT ANG (CATHETERS) ×4 IMPLANT
CLIP FOGARTY SPRING 6M (CLIP) ×4 IMPLANT
CLIP TI MEDIUM 24 (CLIP) IMPLANT
CLIP TI WIDE RED SMALL 24 (CLIP) ×4 IMPLANT
CRADLE DONUT ADULT HEAD (MISCELLANEOUS) ×4 IMPLANT
DERMABOND ADVANCED (GAUZE/BANDAGES/DRESSINGS) ×2
DERMABOND ADVANCED .7 DNX12 (GAUZE/BANDAGES/DRESSINGS) IMPLANT
DRAPE CARDIOVASCULAR INCISE (DRAPES) ×4
DRAPE SLUSH/WARMER DISC (DRAPES) ×4 IMPLANT
DRAPE SRG 135X102X78XABS (DRAPES) ×2 IMPLANT
DRSG COVADERM 4X14 (GAUZE/BANDAGES/DRESSINGS) ×4 IMPLANT
ELECT REM PT RETURN 9FT ADLT (ELECTROSURGICAL) ×8
ELECTRODE REM PT RTRN 9FT ADLT (ELECTROSURGICAL) ×4 IMPLANT
FELT TEFLON 1X6 (MISCELLANEOUS) ×6 IMPLANT
GAUZE SPONGE 4X4 12PLY STRL (GAUZE/BANDAGES/DRESSINGS) ×8 IMPLANT
GLOVE BIO SURGEON STRL SZ 6 (GLOVE) ×2 IMPLANT
GLOVE BIO SURGEON STRL SZ 6.5 (GLOVE) ×3 IMPLANT
GLOVE BIO SURGEONS STRL SZ 6.5 (GLOVE) ×3
GLOVE BIOGEL PI IND STRL 6 (GLOVE) IMPLANT
GLOVE BIOGEL PI IND STRL 6.5 (GLOVE) IMPLANT
GLOVE BIOGEL PI INDICATOR 6 (GLOVE) ×2
GLOVE BIOGEL PI INDICATOR 6.5 (GLOVE) ×8
GLOVE SURG SIGNA 7.5 PF LTX (GLOVE) ×12 IMPLANT
GOWN STRL REUS W/ TWL LRG LVL3 (GOWN DISPOSABLE) ×8 IMPLANT
GOWN STRL REUS W/ TWL XL LVL3 (GOWN DISPOSABLE) ×4 IMPLANT
GOWN STRL REUS W/TWL LRG LVL3 (GOWN DISPOSABLE) ×32
GOWN STRL REUS W/TWL XL LVL3 (GOWN DISPOSABLE) ×8
HEMOSTAT POWDER SURGIFOAM 1G (HEMOSTASIS) ×12 IMPLANT
HEMOSTAT SURGICEL 2X14 (HEMOSTASIS) ×4 IMPLANT
INSERT FOGARTY XLG (MISCELLANEOUS) IMPLANT
KIT BASIN OR (CUSTOM PROCEDURE TRAY) ×4 IMPLANT
KIT ROOM TURNOVER OR (KITS) ×4 IMPLANT
KIT SUCTION CATH 14FR (SUCTIONS) ×8 IMPLANT
KIT VASOVIEW HEMOPRO VH 3000 (KITS) ×4 IMPLANT
MARKER GRAFT CORONARY BYPASS (MISCELLANEOUS) ×12 IMPLANT
NS IRRIG 1000ML POUR BTL (IV SOLUTION) ×22 IMPLANT
PACK OPEN HEART (CUSTOM PROCEDURE TRAY) ×4 IMPLANT
PAD ARMBOARD 7.5X6 YLW CONV (MISCELLANEOUS) ×8 IMPLANT
PAD CARDIAC INSULATION (MISCELLANEOUS) ×2 IMPLANT
PAD ELECT DEFIB RADIOL ZOLL (MISCELLANEOUS) ×4 IMPLANT
PENCIL BUTTON HOLSTER BLD 10FT (ELECTRODE) ×6 IMPLANT
PUNCH AORTIC ROT 4.0MM RCL 40 (MISCELLANEOUS) ×2 IMPLANT
PUNCH AORTIC ROTATE 4.0MM (MISCELLANEOUS) IMPLANT
PUNCH AORTIC ROTATE 4.5MM 8IN (MISCELLANEOUS) IMPLANT
PUNCH AORTIC ROTATE 5MM 8IN (MISCELLANEOUS) IMPLANT
SENSOR MYOCARDIAL TEMP (MISCELLANEOUS) ×2 IMPLANT
SET CARDIOPLEGIA MPS 5001102 (MISCELLANEOUS) ×2 IMPLANT
SOLUTION ANTI FOG 6CC (MISCELLANEOUS) ×2 IMPLANT
SUT BONE WAX W31G (SUTURE) ×4 IMPLANT
SUT MNCRL AB 4-0 PS2 18 (SUTURE) ×2 IMPLANT
SUT PROLENE 3 0 SH DA (SUTURE) ×4 IMPLANT
SUT PROLENE 4 0 RB 1 (SUTURE) ×12
SUT PROLENE 4 0 SH DA (SUTURE) IMPLANT
SUT PROLENE 4-0 RB1 .5 CRCL 36 (SUTURE) IMPLANT
SUT PROLENE 6 0 C 1 30 (SUTURE) ×10 IMPLANT
SUT PROLENE 7 0 BV 1 (SUTURE) ×14 IMPLANT
SUT PROLENE 7 0 BV1 MDA (SUTURE) ×4 IMPLANT
SUT PROLENE 8 0 BV175 6 (SUTURE) IMPLANT
SUT STEEL 6MS V (SUTURE) ×4 IMPLANT
SUT STEEL STERNAL CCS#1 18IN (SUTURE) IMPLANT
SUT STEEL SZ 6 DBL 3X14 BALL (SUTURE) ×4 IMPLANT
SUT VIC AB 1 CTX 36 (SUTURE) ×8
SUT VIC AB 1 CTX36XBRD ANBCTR (SUTURE) ×4 IMPLANT
SUT VIC AB 2-0 CT1 27 (SUTURE) ×4
SUT VIC AB 2-0 CT1 TAPERPNT 27 (SUTURE) IMPLANT
SUT VIC AB 2-0 CTX 27 (SUTURE) IMPLANT
SUT VIC AB 3-0 SH 27 (SUTURE)
SUT VIC AB 3-0 SH 27X BRD (SUTURE) IMPLANT
SUT VIC AB 3-0 X1 27 (SUTURE) IMPLANT
SUT VICRYL 4-0 PS2 18IN ABS (SUTURE) IMPLANT
SUTURE E-PAK OPEN HEART (SUTURE) ×4 IMPLANT
SYSTEM SAHARA CHEST DRAIN ATS (WOUND CARE) ×4 IMPLANT
TOWEL OR 17X24 6PK STRL BLUE (TOWEL DISPOSABLE) ×8 IMPLANT
TOWEL OR 17X26 10 PK STRL BLUE (TOWEL DISPOSABLE) ×8 IMPLANT
TRAY FOLEY IC TEMP SENS 16FR (CATHETERS) ×4 IMPLANT
TUBE FEEDING 8FR 16IN STR KANG (MISCELLANEOUS) ×4 IMPLANT
TUBING INSUFFLATION (TUBING) ×4 IMPLANT
UNDERPAD 30X30 (UNDERPADS AND DIAPERS) ×4 IMPLANT
WATER STERILE IRR 1000ML POUR (IV SOLUTION) ×8 IMPLANT

## 2015-12-31 NOTE — Transfer of Care (Signed)
Immediate Anesthesia Transfer of Care Note  Patient: Sheena Rice  Procedure(s) Performed: Procedure(s) with comments: CORONARY ARTERY BYPASS GRAFTING (CABG), ON PUMP, TIMES FOUR, USING LEFT INTERNAL MAMMARY ARTERY, RIGHT GREATER SAPHENOUS VEIN HARVESTED ENDOSCOPICALLY (N/A) - -LIMA to LAD -SVG to DIAGONAL -SVG to OM -SVG to PDA TRANSESOPHAGEAL ECHOCARDIOGRAM (TEE) (N/A)  Patient Location: SICU  Anesthesia Type:General  Level of Consciousness: sedated and unresponsive  Airway & Oxygen Therapy: Patient remains intubated per anesthesia plan and Patient placed on Ventilator (see vital sign flow sheet for setting)  Post-op Assessment: Post -op Vital signs reviewed and stable  Post vital signs: Reviewed and stable  Last Vitals:  Vitals:   12/30/15 2242 12/31/15 0412  BP: (!) 140/57 (!) 147/54  Pulse: 62 60  Resp: 18 16  Temp: 36.6 C 36.8 C    Last Pain:  Vitals:   12/31/15 0412  TempSrc: Oral         Complications: No apparent anesthesia complications

## 2015-12-31 NOTE — OR Nursing (Signed)
Forty-five minute call to SICU charge nurse at 1304. Spoke to [place]. Going to room 9.

## 2015-12-31 NOTE — Interval H&P Note (Signed)
History and Physical Interval Note:  12/31/2015 7:57 AM  Sheena Rice  has presented today for surgery, with the diagnosis of CAD  The various methods of treatment have been discussed with the patient and family. After consideration of risks, benefits and other options for treatment, the patient has consented to  Procedure(s): CORONARY ARTERY BYPASS GRAFTING (CABG) (N/A) TRANSESOPHAGEAL ECHOCARDIOGRAM (TEE) (N/A) as a surgical intervention .  The patient's history has been reviewed, patient examined, no change in status, stable for surgery.  I have reviewed the patient's chart and labs.  Questions were answered to the patient's satisfaction.     [name]

## 2015-12-31 NOTE — Progress Notes (Signed)
  Echocardiogram Echocardiogram Transesophageal has been performed.  Darlina Sicilian M 12/31/2015, 8:43 AM

## 2015-12-31 NOTE — Anesthesia Procedure Notes (Addendum)
Central Venous Catheter Insertion Performed by: anesthesiologist 12/31/2015 7:00 AM Preanesthetic checklist: patient identified, IV checked, site marked, risks and benefits discussed, surgical consent, monitors and equipment checked, pre-op evaluation and timeout performed Landmarks identified and Seldinger technique used Catheter size: 8 Fr Central line was placed.Double lumen Procedure performed using ultrasound guided technique. Attempts: 1 Following insertion, line sutured, dressing applied and Biopatch. Post procedure assessment: blood return through all ports, free fluid flow and no air. Patient tolerated the procedure well with no immediate complications.

## 2015-12-31 NOTE — OR Nursing (Signed)
Twenty minute call to SICU charge nurse at 1331. Spoke to [place].

## 2015-12-31 NOTE — Anesthesia Procedure Notes (Signed)
Procedure Name: Intubation Date/Time: 12/31/2015 8:23 AM Performed by: Mariea Clonts Pre-anesthesia Checklist: Patient identified, Emergency Drugs available, Suction available and Patient being monitored Patient Re-evaluated:Patient Re-evaluated prior to inductionOxygen Delivery Method: Circle System Utilized Preoxygenation: Pre-oxygenation with 100% oxygen Intubation Type: IV induction Ventilation: Mask ventilation without difficulty Laryngoscope Size: Miller and 2 Grade View: Grade I Tube type: Oral Tube size: 7.5 mm Number of attempts: 1 Airway Equipment and Method: Stylet and Oral airway Placement Confirmation: ETT inserted through vocal cords under direct vision,  positive ETCO2 and breath sounds checked- equal and bilateral Tube secured with: Tape Dental Injury: Teeth and Oropharynx as per pre-operative assessment

## 2015-12-31 NOTE — Anesthesia Procedure Notes (Signed)
Central Venous Catheter Insertion Performed by: anesthesiologist 12/31/2015 7:02 AM Preanesthetic checklist: patient identified, IV checked, site marked, risks and benefits discussed, surgical consent, monitors and equipment checked, pre-op evaluation and timeout performed Position: Trendelenburg Lidocaine 1% used for infiltration Landmarks identified and Seldinger technique used Catheter size: 8.5 Fr PA cath was placed.Attempts: 1 Following insertion, line sutured and dressing applied. Post procedure assessment: blood return through all ports, free fluid flow and no air. Patient tolerated the procedure well with no immediate complications.

## 2015-12-31 NOTE — Procedures (Signed)
Extubation Procedure Note  Patient Details:   Name: Sheena Rice DOB: 03-28-1940 MRN: [id number]   Airway Documentation:     Evaluation  O2 sats: stable throughout Complications: No apparent complications Patient did tolerate procedure well. Bilateral Breath Sounds: Clear   Yes   RT extubated pt. Per Rapid wean protocol. Pt. Performed a -44 on the NIF and 1.2L on the vital capacity. Pt. Had a positive cuff leak as well. Pt. Was extubated to 4L nasal cannula. Pt. Performed 1000x5 on the IS. No apparent complications, pt. Tolerating well at this time.  Marlowe Aschoff 12/31/2015, 9:44 PM

## 2015-12-31 NOTE — Progress Notes (Signed)
Wean protocol initiated. 

## 2015-12-31 NOTE — Progress Notes (Signed)
      [address]       [place] [address]             [phone]      S/p CABG x 4  Intubated, sedated  BP 108/63   Pulse 89   Temp 97.3 F (36.3 C)   Resp 16   Ht 5\' 4"  (1.626 m)   Wt 133 lb 3.2 oz (60.4 kg)   SpO2 99%   BMI 22.86 kg/m   27/16 CI= 2.1 on dopamine at 5 mcg/kg/min   Intake/Output Summary (Last 24 hours) at 12/31/15 1727 Last data filed at 12/31/15 1700  Gross per 24 hour  Intake          5185.84 ml  Output             5180 ml  Net             5.84 ml   Initial ABG showed metabolic acidosis- corrected with bicarb, follow up ABG showed respiratory acidosis- vent changed to IMV 16  K= 3.8 - supplement K  ECG shows new RBBB  Remo Lipps C. Roxan Hockey, MD [facility] [phone]

## 2015-12-31 NOTE — Anesthesia Preprocedure Evaluation (Addendum)
Anesthesia Evaluation  Patient identified by MRN, date of birth, ID band Patient awake    Reviewed: Allergy & Precautions, NPO status , Patient's Chart, lab work & pertinent test results  History of Anesthesia Complications Negative for: history of anesthetic complications  Airway Mallampati: II  TM Distance: <3 FB Neck ROM: Full  Mouth opening: Limited Mouth Opening  Dental  (+) Teeth Intact   Pulmonary neg pulmonary ROS,    breath sounds clear to auscultation       Cardiovascular hypertension, + angina + CAD   Rhythm:Regular Rate:Normal     Neuro/Psych negative neurological ROS     GI/Hepatic GERD  ,  Endo/Other  negative endocrine ROS  Renal/GU negative Renal ROS     Musculoskeletal  (+) Arthritis ,   Abdominal   Peds  Hematology negative hematology ROS (+)   Anesthesia Other Findings   Reproductive/Obstetrics                            Anesthesia Physical Anesthesia Plan  ASA: III  Anesthesia Plan: General   Post-op Pain Management:    Induction: Intravenous  Airway Management Planned: Oral ETT  Additional Equipment: PA Cath, Arterial line and TEE  Intra-op Plan:   Post-operative Plan: Post-operative intubation/ventilation  Informed Consent: I have reviewed the patients History and Physical, chart, labs and discussed the procedure including the risks, benefits and alternatives for the proposed anesthesia with the patient or authorized representative who has indicated his/her understanding and acceptance.   Dental advisory given  Plan Discussed with: CRNA  Anesthesia Plan Comments:         Anesthesia Quick Evaluation

## 2015-12-31 NOTE — Brief Op Note (Addendum)
12/30/2015 - 12/31/2015  11:37 AM  PATIENT:  Sheena Rice  75 y.o. female  PRE-OPERATIVE DIAGNOSIS:  3 VESSEL CAD with UNSTABLE ANGINA  POST-OPERATIVE DIAGNOSIS:  3 VESSEL CAD with UNSTABLE ANGINA  PROCEDURE:  Procedure(s):   CORONARY ARTERY BYPASS GRAFTING x 4 -LIMA to LAD -SVG to DIAGONAL 1 -SVG to OM 2 -SVG to PDA  ENDOSCOPIC HARVEST GREATER SAPHENOUS VEIN -Right Leg  TRANSESOPHAGEAL ECHOCARDIOGRAM (TEE) (N/A)  SURGEON:  Surgeon(s) and Role:    * [name], MD - Primary  PHYSICIAN ASSISTANT: Ellwood Handler PA-C  ANESTHESIA:   general  EBL:  Total I/O In: -  Out: 100 [Urine:100]  BLOOD ADMINISTERED: CELLSAVER  DRAINS: Left Pleural Chest Tubes, Mediastinal Chest Tubes   LOCAL MEDICATIONS USED:  NONE  SPECIMEN:  No Specimen  DISPOSITION OF SPECIMEN:  N/A  COUNTS:  YES  PLAN OF CARE: Admit to inpatient   PATIENT DISPOSITION:  ICU - intubated and hemodynamically stable.   Delay start of Pharmacological VTE agent (>24hrs) due to surgical blood loss or risk of bleeding: yes  XC= 79 min CPB= 154 min- off on 2nd attempt with dopamine @ 5 mcg/kg/min  LAD deeply intramyocardial

## 2016-01-01 ENCOUNTER — Inpatient Hospital Stay (HOSPITAL_COMMUNITY): Payer: Commercial Managed Care - HMO

## 2016-01-01 ENCOUNTER — Encounter (HOSPITAL_COMMUNITY): Payer: Self-pay | Admitting: Thoracic Surgery (Cardiothoracic Vascular Surgery)

## 2016-01-01 LAB — GLUCOSE, CAPILLARY
GLUCOSE-CAPILLARY: 113 mg/dL — AB (ref 65–99)
GLUCOSE-CAPILLARY: 103 mg/dL — AB (ref 65–99)
GLUCOSE-CAPILLARY: 111 mg/dL — AB (ref 65–99)
GLUCOSE-CAPILLARY: 117 mg/dL — AB (ref 65–99)
GLUCOSE-CAPILLARY: 118 mg/dL — AB (ref 65–99)
GLUCOSE-CAPILLARY: 124 mg/dL — AB (ref 65–99)
GLUCOSE-CAPILLARY: 103 mg/dL — AB (ref 65–99)
Glucose-Capillary: 105 mg/dL — ABNORMAL HIGH (ref 65–99)
Glucose-Capillary: 110 mg/dL — ABNORMAL HIGH (ref 65–99)
Glucose-Capillary: 117 mg/dL — ABNORMAL HIGH (ref 65–99)
Glucose-Capillary: 132 mg/dL — ABNORMAL HIGH (ref 65–99)
Glucose-Capillary: 136 mg/dL — ABNORMAL HIGH (ref 65–99)
Glucose-Capillary: 93 mg/dL (ref 65–99)
Glucose-Capillary: 98 mg/dL (ref 65–99)

## 2016-01-01 LAB — CBC
HEMATOCRIT: 33.1 % — AB (ref 36.0–46.0)
HEMATOCRIT: 36.1 % (ref 36.0–46.0)
Hemoglobin: 11.4 g/dL — ABNORMAL LOW (ref 12.0–15.0)
Hemoglobin: 12.6 g/dL (ref 12.0–15.0)
MCH: 30.2 pg (ref 26.0–34.0)
MCH: 30.4 pg (ref 26.0–34.0)
MCHC: 34.4 g/dL (ref 30.0–36.0)
MCHC: 34.9 g/dL (ref 30.0–36.0)
MCV: 87.6 fL (ref 78.0–100.0)
MCV: 87.2 fL (ref 78.0–100.0)
PLATELETS: 94 10*3/uL — AB (ref 150–400)
PLATELETS: 106 10*3/uL — AB (ref 150–400)
RBC: 3.78 MIL/uL — ABNORMAL LOW (ref 3.87–5.11)
RBC: 4.14 MIL/uL (ref 3.87–5.11)
RDW: 13.4 % (ref 11.5–15.5)
RDW: 13.4 % (ref 11.5–15.5)
WBC: 12.8 10*3/uL — ABNORMAL HIGH (ref 4.0–10.5)
WBC: 17.3 10*3/uL — ABNORMAL HIGH (ref 4.0–10.5)

## 2016-01-01 LAB — BASIC METABOLIC PANEL
Anion gap: 5 (ref 5–15)
BUN: 10 mg/dL (ref 6–20)
CALCIUM: 7.8 mg/dL — AB (ref 8.9–10.3)
CO2: 24 mmol/L (ref 22–32)
CREATININE: 0.56 mg/dL (ref 0.44–1.00)
Chloride: 111 mmol/L (ref 101–111)
GLUCOSE: 116 mg/dL — AB (ref 65–99)
Potassium: 3.9 mmol/L (ref 3.5–5.1)
Sodium: 140 mmol/L (ref 135–145)

## 2016-01-01 LAB — CREATININE, SERUM
Creatinine, Ser: 0.82 mg/dL (ref 0.44–1.00)
GFR calc Af Amer: >60 mL/min (ref >60)
GFR calc non Af Amer: >60 mL/min (ref >60)

## 2016-01-01 LAB — POCT I-STAT, CHEM 8
BUN: 16 mg/dL (ref 6–20)
CHLORIDE: 104 mmol/L (ref 101–111)
CREATININE: 0.7 mg/dL (ref 0.44–1.00)
Calcium, Ion: 1.2 mmol/L (ref 1.15–1.40)
GLUCOSE: 165 mg/dL — AB (ref 65–99)
HCT: 36 % (ref 36.0–46.0)
HEMOGLOBIN: 12.2 g/dL (ref 12.0–15.0)
POTASSIUM: 5 mmol/L (ref 3.5–5.1)
Sodium: 139 mmol/L (ref 135–145)
TCO2: 21 mmol/L (ref 0–100)

## 2016-01-01 LAB — MAGNESIUM
Magnesium: 2.1 mg/dL (ref 1.7–2.4)
Magnesium: 2 mg/dL (ref 1.7–2.4)

## 2016-01-01 MED ORDER — INSULIN ASPART 100 UNIT/ML ~~LOC~~ SOLN
3.0000 [IU] | Freq: Three times a day (TID) | SUBCUTANEOUS | Status: DC
Start: 2016-01-01 — End: 2016-01-02
  Administered 2016-01-01 (×2): 3 [IU] via SUBCUTANEOUS

## 2016-01-01 MED ORDER — INSULIN DETEMIR 100 UNIT/ML ~~LOC~~ SOLN
20.0000 [IU] | Freq: Two times a day (BID) | SUBCUTANEOUS | Status: DC
  Filled 2016-01-01 (×2): qty 0.2

## 2016-01-01 MED ORDER — METOPROLOL TARTRATE 25 MG/10 ML ORAL SUSPENSION: 25.0000 mg | Freq: Two times a day (BID) | ORAL | Status: DC

## 2016-01-01 MED ORDER — INSULIN ASPART 100 UNIT/ML ~~LOC~~ SOLN
0.0000 [IU] | SUBCUTANEOUS | Status: DC
  Administered 2016-01-01: 4 [IU] via SUBCUTANEOUS
  Administered 2016-01-01 – 2016-01-02 (×2): 2 [IU] via SUBCUTANEOUS

## 2016-01-01 MED ORDER — METOPROLOL TARTRATE 25 MG PO TABS
25.0000 mg | ORAL_TABLET | Freq: Two times a day (BID) | ORAL | Status: DC
  Administered 2016-01-01: 25 mg via ORAL
  Filled 2016-01-01: qty 1

## 2016-01-01 MED ORDER — INSULIN DETEMIR 100 UNIT/ML ~~LOC~~ SOLN: 20.0000 [IU] | Freq: Two times a day (BID) | SUBCUTANEOUS | Status: DC

## 2016-01-01 MED ORDER — CYCLOBENZAPRINE HCL 10 MG PO TABS
10.0000 mg | ORAL_TABLET | Freq: Every day | ORAL | Status: DC
Start: 2016-01-01 — End: 2016-01-05
  Administered 2016-01-01 – 2016-01-04 (×4): 10 mg via ORAL
  Filled 2016-01-01 (×4): qty 1

## 2016-01-01 MED ORDER — FUROSEMIDE 10 MG/ML IJ SOLN
20.0000 mg | Freq: Two times a day (BID) | INTRAMUSCULAR | Status: AC
  Administered 2016-01-01 (×2): 20 mg via INTRAVENOUS
  Filled 2016-01-01 (×2): qty 2

## 2016-01-01 MED ORDER — POTASSIUM CHLORIDE 2 MEQ/ML IV SOLN
30.0000 meq | Freq: Once | INTRAVENOUS | Status: AC
  Administered 2016-01-01: 30 meq via INTRAVENOUS
  Filled 2016-01-01: qty 15

## 2016-01-01 NOTE — Progress Notes (Signed)
Patient ID: Sheena Rice, female   DOB: October 29, 1940, 75 y.o.   MRN: [id number] EVENING ROUNDS NOTE :     [place]       [place] [address]             [phone]                 1 Day Post-Op Procedure(s) (LRB): CORONARY ARTERY BYPASS GRAFTING (CABG), ON PUMP, TIMES FOUR, USING LEFT INTERNAL MAMMARY ARTERY, RIGHT GREATER SAPHENOUS VEIN HARVESTED ENDOSCOPICALLY (N/A) TRANSESOPHAGEAL ECHOCARDIOGRAM (TEE) (N/A)  Total Length of Stay:  LOS: 2 days  BP (!) 162/70   Pulse 61   Temp 97.7 F (36.5 C) (Oral)   Resp (!) 28   Ht 5\' 4"  (1.626 m)   Wt 149 lb (67.6 kg)   SpO2 100%   BMI 25.58 kg/m   .Intake/Output      12/14 0701 - 12/15 0700 12/15 0701 - 12/16 0700   P.O.     I.V. (mL/kg) 4492.7 (66.5) 388 (5.7)   Blood 685    NG/GT 40    IV Piggyback 1015 315   Total Intake(mL/kg) 6232.7 (92.2) 703 (10.4)   Urine (mL/kg/hr) 3485 (2.1) 535 (0.8)   Emesis/NG output 50 (0)    Blood 1625 (1)    Chest Tube 370 (0.2)    Total Output 5530 535   Net +702.7 +168          . sodium chloride Stopped (01/01/16 1000)  . sodium chloride    . sodium chloride Stopped (01/01/16 1000)  . dexmedetomidine Stopped (12/31/15 1710)  . DOPamine Stopped (01/01/16 1119)  . lactated ringers 10 mL/hr at 01/01/16 1700  . lactated ringers 10 mL/hr at 01/01/16 1700  . nitroGLYCERIN Stopped (01/01/16 1000)  . phenylephrine (NEO-SYNEPHRINE) Adult infusion Stopped (12/31/15 2151)     Lab Results  Component Value Date   WBC 17.3 (H) 01/01/2016   HGB 12.2 01/01/2016   HCT 36.0 01/01/2016   PLT 106 (L) 01/01/2016   GLUCOSE 165 (H) 01/01/2016   ALT 37 12/30/2015   AST 52 (H) 12/30/2015   NA 139 01/01/2016   K 5.0 01/01/2016   CL 104 01/01/2016   CREATININE 0.70 01/01/2016   BUN 16 01/01/2016   CO2 24 01/01/2016   TSH 1.503 12/30/2015   INR 1.37 12/31/2015   HGBA1C 5.9 (H) 12/30/2015   BP elevated , will increase lopressor to 25 mg bid  Grace Isaac MD  Beeper  [phone] Office [phone] 01/01/2016 5:30 PM

## 2016-01-01 NOTE — Op Note (Signed)
NAMENoel Rice                ACCOUNT NO.:  [id number]  MEDICAL RECORD NO.:  [id number]  LOCATION:                                 FACILITY:  PHYSICIAN:  Revonda Standard. Roxan Hockey, M.D. DATE OF BIRTH:  DATE OF PROCEDURE:  12/31/2015 DATE OF DISCHARGE:                              OPERATIVE REPORT   PREOPERATIVE DIAGNOSIS:  Three-vessel coronary artery disease with unstable angina.  POSTOPERATIVE DIAGNOSIS:  Three-vessel coronary artery disease with unstable angina.  PROCEDURE:  Median sternotomy, extracorporeal circulation, Coronary artery bypass grafting x 4  Left internal mammary artery to left anterior descending,  Saphenous vein graft to obtuse marginal 2,   Saphenous vein graft to first diagonal (Y-graft off OM vein),  Ssaphenous vein graft to posterior descending Endoscopic vein harvest right leg.  SURGEON:  Revonda Standard. Roxan Hockey, M.D.  ASSISTANT:  Ellwood Handler, PA-C.  ANESTHESIA:  General.  FINDINGS:  Transesophageal echocardiography showed preserved left ventricular function, mild mitral regurgitation and tricuspid Regurgitation. Post bypass study unchanged after initially showing moderate MR and TR. Mammary good quality. Vein fair quality. LAD deeply intramyocardial. First diagonal intramyocardial. OM and posterior descending good quality targets.  CLINICAL NOTE:  Sheena Rice is a 75 year old woman with no prior history of coronary disease who presented with an unstable coronary syndrome. At cardiac catheterization, she was found to have three-vessel coronary artery disease.  She was referred for coronary artery bypass grafting. The indications, risks, benefits, and alternatives were discussed in detail with the patient.  She understood and accepted the risks and agreed to proceed.  OPERATIVE NOTE:  Sheena Rice was brought to the preoperative holding area on December 31, 2015.  Anesthesia placed a Swan-Ganz catheter and arterial blood pressure monitoring line.   She was taken to the operating room, anesthetized, and intubated.  A Foley catheter was placed. Intravenous antibiotics were administered.  Transesophageal echocardiography was performed with findings as previously noted.  There was preserved left ventricular function, There was mild MR and TR.  The chest, abdomen, and legs were prepped and draped in usual sterile fashion.  An incision was made in the medial aspect of the right leg at the level of knee.  The greater saphenous vein was harvested from upper calf to groin endoscopically.  Simultaneously, a median sternotomy was performed and the left internal mammary artery was harvested using standard technique.  2000 units of heparin was administered during the vessel harvest.  The remainder of the full heparin dose was given prior to opening the pericardium.  The vein was fair quality, but acceptable. The mammary was good quality vessel with excellent flow.  After harvesting the conduits, the pericardium was opened.  Adequate anticoagulation was confirmed with ACT measurement. The ascending aorta appeared normal with no evidence of atherosclerotic disease. The aorta then was cannulated via concentric 2-0 Ethibond pledgeted pursestring sutures.  A dual stage venous cannula was placed via pursestring suture in the right atrial appendage. Cardiopulmonary bypass was initiated.  Flows were maintained per protocol.  The patient was cooled to 32 degrees Celsius.  The coronary arteries were inspected and anastomotic sites were chosen.  The conduits were inspected and cut to length.  A  foam pad was placed in the pericardium to protect the left phrenic nerve and insulate the heart.  A temperature probe was placed in the myocardial septum and a cardioplegia cannula was placed in the ascending aorta.  The aorta was crossclamped.  The left ventricle was emptied via the aortic root vent.  Cardiac arrest then was achieved with a combination of cold  antegrade blood cardioplegia and topical iced saline.  There was a rapid diastolic arrest and rapid septal cooling.  One liter of cardioplegia was administered and the septum cooled to 9 degrees Celsius.  A reversed saphenous vein graft was placed end-to-side to the posterior descending branch of the right coronary.  This vessel accepted a 1 mm probe distally, a 1.5 mm probe passed proximally.  The vein was of good quality.  It was anastomosed end-to-side with a running 7-0 Prolene suture.  All anastomoses were probed proximally and distally at their completion.  Cardioplegia was administered down to each vein graft at its completion.  There was good flow and good hemostasis.  Next, a reversed saphenous vein graft was placed end-to-side to OM 2. This was a 1.5 mm vessel.  It was relatively thin walled, but otherwise good quality.  The vein was fair quality.  An end-to-side anastomosis was performed with a running 7-0 Prolene suture in an end-to-side fashion.  Again, a probe was passed easily proximally and distally, and Cardioplegia was administered with good flow and good hemostasis.  Next, the first diagonal branch of the LAD was dissected out.  It was deeply intramyocardial with a large fat pad overlying it.  It was dissected retrograde to the bifurcation from the LAD which was even more deeply intramyocardial.  The LAD was dissected out as well.  A saphenous vein then was anastomosed end-to-side to the diagonal with a running 7-0 Prolene suture.  The diagonal did accept a 1.5 mm probe.  It was a good quality vessel at the site of the anastomosis.  Again, a probe passed easily proximally and distally at the completion of anastomosis, and cardioplegia was administered.  There was good hemostasis at the anastomosis and good flow through the graft.  There was bleeding from the myocardial dissection.  An additional 500 mL of cardioplegia was administered via the aortic root.  The  left internal mammary artery was brought through a window in the pericardium.  The distal end was beveled.  It was a 1.5 mm vessel.  An arteriotomy was made in the LAD.  Again, this was deeply Intramyocardial. It accepted a 1.5 mm probe.  The midportion of the LAD was grafted.  The mammary to LAD anastomosis was performed with a running 8- 0 Prolene suture.  At the completion of anastomosis, the bulldog clamp was removed.  Septal rewarming was noted.  The bulldog clamp was replaced.  The mammary pedicle was tacked to the epicardial surface of the heart with 6-0 Prolene sutures.  Additional cardioplegia was administered down the vein grafts and aortic root.  The vein grafts were cut to length.  The cardioplegia cannula was removed from the ascending aorta.  The proximal vein graft anastomoses for the OM and posterior descending veins were performed to 4.0 mm punch aortotomies with running 6-0 Prolene sutures.  At the completion of the second proximal anastomosis, the patient was placed in Trendelenburg position.  Lidocaine was administered.  The bulldog clamp was removed from the left mammary artery.  The aortic root was de-aired and the aortic crossclamp was removed.  The total crossclamp time was 79 minutes.  Bulldog clamps were placed proximally and distally on the OM vein and a longitudinal venotomy was made.  The proximal end of the diagonal vein then was anastomosed end-to-side to the OM vein with a running 7-0 Prolene suture.  This was done because the proximal end of the diagonal vein which was the most distal portion of the vein from the leg, became very small and it was not of adequate length to reach the aorta with suitable size.  At the completion of the anastomosis, it was de-aired distally first and then proximally, and the suture was tied.  There was bleeding from the anastomosis and after a repair stitch was placed, there appeared to be some kinking of the OM vein.   Therefore, the bulldog clamps were once again placed.  The anastomosis was taken down and redone again with a running 7-0 Prolene suture.  After completion of this, there was much better appearance of the OM vein and the anastomosis.  While rewarming was completed, all proximal and distal anastomoses were inspected for hemostasis.  The patient had spontaneously resumed a bradycardic rhythm and did not require defibrillation.  All proximal and distal anastomoses were inspected for hemostasis.  Epicardial pacing wires were placed on the right ventricle and right atrium and DDD pacing was initiated at 80 beats per minute.  When the patient had rewarmed to a core temperature of 37 degrees Celsius, an attempt was made to wean from cardiopulmonary bypass.  The patient did weaned from bypass, but despite good left ventricular function appearance on the echocardiogram, she had very low systemic blood pressures.  This responded only transiently to volume administration and calcium.  The decision was made to go back on bypass, which was done.  A dopamine infusion was initiated at 5 mcg/kg/minute.  After resting for 15 minutes, the patient was weaned from cardiopulmonary bypass on the second attempt without difficulty.  The total bypass time was 154 minutes.  Post-bypass transesophageal echocardiography initially showed preserved left ventricular function, but slightly worsened mitral regurgitation and tricuspid regurgitation.  However, over time those went back to their baseline prebypass levels.  A test dose of protamine was administered and was initially well-tolerated.  The pulmonary artery pressures, however, did rise as the remainder of the protamine was administered.  This was done very slowly.  The atrial and aortic cannulae were removed, and, with time, the pulmonary artery pressures returned to their baseline normal values.  Again, transesophageal echocardiography showed good left  ventricular function.  The cardiac index was greater than 2 L/min/m2 throughout the post bypass.  The chest was copiously irrigated with warm saline.  Hemostasis was achieved.  The pericardium was reapproximated over the aorta, but not the heart.  This was done with interrupted 3-0 silk sutures.  Left pleural and mediastinal tubes were placed through separate subcostal incisions.  The sternum was closed with a combination of single and double heavy gauge stainless steel wires.  The pectoralis fascia, subcutaneous tissue, and skin were closed in standard fashion.  All sponge, needle, and instrument counts were correct at the end of the procedure.  The patient was taken from the operating room to the Surgical Intensive Care Unit in good condition.     Revonda Standard Roxan Hockey, M.D.     SCH/MEDQ  D:  12/31/2015  T:  01/01/2016  Job:  [id number]

## 2016-01-01 NOTE — Progress Notes (Signed)
1 Day Post-Op Procedure(s) (LRB): CORONARY ARTERY BYPASS GRAFTING (CABG), ON PUMP, TIMES FOUR, USING LEFT INTERNAL MAMMARY ARTERY, RIGHT GREATER SAPHENOUS VEIN HARVESTED ENDOSCOPICALLY (N/A) TRANSESOPHAGEAL ECHOCARDIOGRAM (TEE) (N/A) Subjective: Some incisional pain Nausea last night, none this AM  Objective: Vital signs in last 24 hours: Temp:  [95.9 F (35.5 C)-97.7 F (36.5 C)] 97.3 F (36.3 C) (12/15 0745) Pulse Rate:  [56-90] 77 (12/15 0745) Cardiac Rhythm: Atrial paced;Normal sinus rhythm (12/15 0500) Resp:  [4-26] 26 (12/15 0745) BP: (99-136)/(48-81) 136/77 (12/15 0745) SpO2:  [91 %-100 %] 96 % (12/15 0745) Arterial Line BP: (89-164)/(46-74) 162/72 (12/15 0745) FiO2 (%):  [40 %-50 %] 40 % (12/14 2109) Weight:  [149 lb (67.6 kg)] 149 lb (67.6 kg) (12/15 0500)  Hemodynamic parameters for last 24 hours: PAP: (21-37)/(4-20) 36/20 CVP:  [0 mmHg] 0 mmHg CO:  [3.1 L/min-4.3 L/min] 3.4 L/min CI:  [1.9 L/min/m2-2.6 L/min/m2] 2.1 L/min/m2  Intake/Output from previous day: 12/14 0701 - 12/15 0700 In: 6232.7 [I.V.:4492.7; Blood:685; NG/GT:40; IV Piggyback:1015] Out: [id number] [Urine:3485; Emesis/NG output:50; Blood:1625; Chest Tube:370] Intake/Output this shift: No intake/output data recorded.  General appearance: alert, cooperative and no distress Neurologic: intact Heart: regular rate and rhythm Lungs: diminished breath sounds bibasilar Abdomen: normal findings: soft, non-tender  Lab Results:  Recent Labs  12/31/15 2030 01/01/16 0452  WBC 13.2* 12.8*  HGB 12.4 11.4*  HCT 36.3 33.1*  PLT 98* 94*   BMET:  Recent Labs  12/31/15 0212  12/31/15 2021 12/31/15 2030 01/01/16 0452  NA 138  < > 141  --  140  K 4.0  < > 3.3*  --  3.9  CL 107  < > 110  --  111  CO2 23  --   --   --  24  GLUCOSE 122*  < > 124*  --  116*  BUN 14  < > 10  --  10  CREATININE 0.69  < > 0.50 0.67 0.56  CALCIUM 8.8*  --   --   --  7.8*  < > = values in this interval not displayed.  PT/INR:   Recent Labs  12/31/15 1434  LABPROT 17.0*  INR 1.37   ABG    Component Value Date/Time   PHART 7.306 (L) 12/31/2015 2131   HCO3 21.0 12/31/2015 2131   TCO2 22 12/31/2015 2131   ACIDBASEDEF 5.0 (H) 12/31/2015 2131   O2SAT 97.0 12/31/2015 2131   CBG (last 3)   Recent Labs  01/01/16 0549 01/01/16 0650 01/01/16 0743  GLUCAP 118* 132* 124*    Assessment/Plan: S/P Procedure(s) (LRB): CORONARY ARTERY BYPASS GRAFTING (CABG), ON PUMP, TIMES FOUR, USING LEFT INTERNAL MAMMARY ARTERY, RIGHT GREATER SAPHENOUS VEIN HARVESTED ENDOSCOPICALLY (N/A) TRANSESOPHAGEAL ECHOCARDIOGRAM (TEE) (N/A) -CV- s/p CABG x 4  Good cardiac index- dc swan, wean dopamine  Metoprolol, ASA  Statin allergy- on lopid, colestid  ECG postop has new RBBB  RESP- IS for atelectasis  CXR this AM shows a small left apical pneumo- no air leak form tube- follow  RENAL- creatinine normal, diurese for volume overload  ENDO- CBG mildly elevated, still on insulin drip  Transition to levemir + SSI  Anemia secondary to ABL- mild after 2 U PRBC intraop, follow  Thrombocytopenia- mild, no evidence of bleeding- no heparin or enoxaparin for now  SCD for DVT prophylaxis   LOS: 2 days    [name] 01/01/2016

## 2016-01-02 ENCOUNTER — Inpatient Hospital Stay (HOSPITAL_COMMUNITY): Payer: Commercial Managed Care - HMO

## 2016-01-02 LAB — CBC
HEMATOCRIT: 36.9 % (ref 36.0–46.0)
Hemoglobin: 12.5 g/dL (ref 12.0–15.0)
MCH: 29.8 pg (ref 26.0–34.0)
MCHC: 33.9 g/dL (ref 30.0–36.0)
MCV: 88.1 fL (ref 78.0–100.0)
PLATELETS: 132 10*3/uL — AB (ref 150–400)
RBC: 4.19 MIL/uL (ref 3.87–5.11)
RDW: 13.7 % (ref 11.5–15.5)
WBC: 17.2 10*3/uL — ABNORMAL HIGH (ref 4.0–10.5)

## 2016-01-02 LAB — BASIC METABOLIC PANEL
ANION GAP: 8 (ref 5–15)
BUN: 21 mg/dL — AB (ref 6–20)
CO2: 21 mmol/L — ABNORMAL LOW (ref 22–32)
CREATININE: 1.04 mg/dL — AB (ref 0.44–1.00)
Calcium: 8.6 mg/dL — ABNORMAL LOW (ref 8.9–10.3)
Chloride: 105 mmol/L (ref 101–111)
GFR, EST AFRICAN AMERICAN: 60 mL/min — AB (ref >60)
GFR, EST NON AFRICAN AMERICAN: 52 mL/min — AB (ref >60)
GLUCOSE: 151 mg/dL — AB (ref 65–99)
Potassium: 4.6 mmol/L (ref 3.5–5.1)
Sodium: 134 mmol/L — ABNORMAL LOW (ref 135–145)

## 2016-01-02 LAB — GLUCOSE, CAPILLARY
GLUCOSE-CAPILLARY: 151 mg/dL — AB (ref 65–99)
GLUCOSE-CAPILLARY: 109 mg/dL — AB (ref 65–99)
GLUCOSE-CAPILLARY: 131 mg/dL — AB (ref 65–99)
Glucose-Capillary: 117 mg/dL — ABNORMAL HIGH (ref 65–99)
Glucose-Capillary: 91 mg/dL (ref 65–99)
Glucose-Capillary: 143 mg/dL — ABNORMAL HIGH (ref 65–99)

## 2016-01-02 MED ORDER — DOCUSATE SODIUM 100 MG PO CAPS
200.0000 mg | ORAL_CAPSULE | Freq: Every day | ORAL | Status: DC
  Administered 2016-01-02 – 2016-01-04 (×3): 200 mg via ORAL
  Filled 2016-01-02 (×4): qty 2

## 2016-01-02 MED ORDER — ONDANSETRON HCL 4 MG/2ML IJ SOLN: 4.0000 mg | Freq: Four times a day (QID) | INTRAMUSCULAR | Status: DC | PRN

## 2016-01-02 MED ORDER — METOPROLOL TARTRATE 25 MG PO TABS
25.0000 mg | ORAL_TABLET | Freq: Two times a day (BID) | ORAL | Status: DC
  Administered 2016-01-02 – 2016-01-05 (×7): 25 mg via ORAL
  Filled 2016-01-02 (×7): qty 1

## 2016-01-02 MED ORDER — SODIUM CHLORIDE 0.9% FLUSH
3.0000 mL | INTRAVENOUS | Status: DC | PRN
  Administered 2016-01-02 – 2016-01-03 (×2): 3 mL via INTRAVENOUS
  Filled 2016-01-02 (×2): qty 3

## 2016-01-02 MED ORDER — ONDANSETRON HCL 4 MG PO TABS: 4.0000 mg | ORAL_TABLET | Freq: Four times a day (QID) | ORAL | Status: DC | PRN

## 2016-01-02 MED ORDER — TRAMADOL HCL 50 MG PO TABS: 50.0000 mg | ORAL_TABLET | Freq: Four times a day (QID) | ORAL | Status: DC | PRN

## 2016-01-02 MED ORDER — GUAIFENESIN ER 600 MG PO TB12: 600.0000 mg | ORAL_TABLET | Freq: Two times a day (BID) | ORAL | Status: DC | PRN

## 2016-01-02 MED ORDER — INSULIN ASPART 100 UNIT/ML ~~LOC~~ SOLN
0.0000 [IU] | Freq: Three times a day (TID) | SUBCUTANEOUS | Status: DC
  Administered 2016-01-02 (×2): 2 [IU] via SUBCUTANEOUS

## 2016-01-02 MED ORDER — SODIUM CHLORIDE 0.9% FLUSH
3.0000 mL | Freq: Two times a day (BID) | INTRAVENOUS | Status: DC
  Administered 2016-01-02 – 2016-01-04 (×2): 3 mL via INTRAVENOUS

## 2016-01-02 MED ORDER — BISACODYL 10 MG RE SUPP: 10.0000 mg | Freq: Every day | RECTAL | Status: DC | PRN

## 2016-01-02 MED ORDER — BISACODYL 5 MG PO TBEC: 10.0000 mg | DELAYED_RELEASE_TABLET | Freq: Every day | ORAL | Status: DC | PRN

## 2016-01-02 MED ORDER — SODIUM CHLORIDE 0.9 % IV SOLN: 250.0000 mL | INTRAVENOUS | Status: DC | PRN

## 2016-01-02 MED ORDER — ENOXAPARIN SODIUM 30 MG/0.3ML ~~LOC~~ SOLN
30.0000 mg | SUBCUTANEOUS | Status: DC
  Administered 2016-01-02 – 2016-01-04 (×3): 30 mg via SUBCUTANEOUS
  Filled 2016-01-02 (×4): qty 0.3

## 2016-01-02 MED ORDER — MOVING RIGHT ALONG BOOK
Freq: Once | Status: AC
  Administered 2016-01-02: 09:00:00
  Filled 2016-01-02: qty 1

## 2016-01-02 MED ORDER — FUROSEMIDE 20 MG PO TABS
20.0000 mg | ORAL_TABLET | Freq: Every day | ORAL | Status: DC
  Administered 2016-01-02: 20 mg via ORAL
  Filled 2016-01-02: qty 1

## 2016-01-02 MED ORDER — OXYCODONE HCL 5 MG PO TABS: 5.0000 mg | ORAL_TABLET | ORAL | Status: DC | PRN

## 2016-01-02 MED ORDER — ASPIRIN EC 81 MG PO TBEC
81.0000 mg | DELAYED_RELEASE_TABLET | Freq: Every day | ORAL | Status: DC
  Administered 2016-01-02 – 2016-01-05 (×4): 81 mg via ORAL
  Filled 2016-01-02 (×4): qty 1

## 2016-01-02 MED ORDER — PANTOPRAZOLE SODIUM 40 MG PO TBEC
40.0000 mg | DELAYED_RELEASE_TABLET | Freq: Every day | ORAL | Status: DC
  Administered 2016-01-02 – 2016-01-05 (×4): 40 mg via ORAL
  Filled 2016-01-02 (×4): qty 1

## 2016-01-02 MED ORDER — POTASSIUM CHLORIDE CRYS ER 20 MEQ PO TBCR
20.0000 meq | EXTENDED_RELEASE_TABLET | Freq: Every day | ORAL | Status: AC
  Administered 2016-01-02 – 2016-01-04 (×3): 20 meq via ORAL
  Filled 2016-01-02 (×3): qty 1

## 2016-01-02 NOTE — Progress Notes (Signed)
Patient ID: Sheena Rice, female   DOB: Oct 09, 1940, 75 y.o.   MRN: [id number] TCTS DAILY ICU PROGRESS NOTE                   [address]            [place] [address]          [phone]   2 Days Post-Op Procedure(s) (LRB): CORONARY ARTERY BYPASS GRAFTING (CABG), ON PUMP, TIMES FOUR, USING LEFT INTERNAL MAMMARY ARTERY, RIGHT GREATER SAPHENOUS VEIN HARVESTED ENDOSCOPICALLY (N/A) TRANSESOPHAGEAL ECHOCARDIOGRAM (TEE) (N/A)  Total Length of Stay:  LOS: 3 days   Subjective: Up to chair ,feels well , walked in hall this am  Objective: Vital signs in last 24 hours: Temp:  [97.2 F (36.2 C)-98.3 F (36.8 C)] 97.6 F (36.4 C) (12/16 0420) Pulse Rate:  [55-90] 56 (12/16 0705) Cardiac Rhythm: Normal sinus rhythm (12/16 0400) Resp:  [9-28] 16 (12/16 0705) BP: (100-172)/(50-93) 123/59 (12/16 0705) SpO2:  [89 %-100 %] 93 % (12/16 0705) Weight:  [150 lb 11.2 oz (68.4 kg)] 150 lb 11.2 oz (68.4 kg) (12/16 0500)  Filed Weights   12/31/15 0412 01/01/16 0500 01/02/16 0500  Weight: 133 lb 3.2 oz (60.4 kg) 149 lb (67.6 kg) 150 lb 11.2 oz (68.4 kg)    Weight change: 1 lb 11.2 oz (0.771 kg)   Hemodynamic parameters for last 24 hours:    Intake/Output from previous day: 12/15 0701 - 12/16 0700 In: 1853 [P.O.:840; I.V.:648; IV Piggyback:365] Out: 935 [Urine:935]  Intake/Output this shift: No intake/output data recorded.  Current Meds: Scheduled Meds: . acetaminophen  1,000 mg Oral Q6H   Or  . acetaminophen (TYLENOL) oral liquid 160 mg/5 mL  1,000 mg Per Tube Q6H  . aspirin EC  325 mg Oral Daily   Or  . aspirin  324 mg Per Tube Daily  . bisacodyl  10 mg Oral Daily   Or  . bisacodyl  10 mg Rectal Daily  . colestipol  2 g Oral BID  . cyclobenzaprine  10 mg Oral QHS  . docusate sodium  200 mg Oral Daily  . gemfibrozil  600 mg Oral BID  . insulin aspart  0-24 Units Subcutaneous Q4H  . insulin aspart  3 Units Subcutaneous TID WC  . magnesium oxide  400 mg Oral BID   . metoprolol tartrate  25 mg Oral BID   Or  . metoprolol tartrate  25 mg Per Tube BID  . pantoprazole  40 mg Oral Daily  . sodium chloride flush  3 mL Intravenous Q12H   Continuous Infusions: . sodium chloride Stopped (01/01/16 1000)  . sodium chloride    . sodium chloride Stopped (01/01/16 1000)  . dexmedetomidine Stopped (12/31/15 1710)  . DOPamine Stopped (01/01/16 1119)  . lactated ringers 10 mL/hr at 01/01/16 2000  . lactated ringers 10 mL/hr at 01/01/16 2000  . nitroGLYCERIN Stopped (01/01/16 1000)  . phenylephrine (NEO-SYNEPHRINE) Adult infusion Stopped (12/31/15 2151)   PRN Meds:.sodium chloride, LORazepam, metoprolol, midazolam, morphine injection, ondansetron (ZOFRAN) IV, oxyCODONE, sodium chloride flush, traMADol  General appearance: alert and cooperative Neurologic: intact Heart: regular rate and rhythm, S1, S2 normal, no murmur, click, rub or gallop Lungs: diminished breath sounds bibasilar Abdomen: soft, non-tender; bowel sounds normal; no masses,  no organomegaly Extremities: extremities normal, atraumatic, no cyanosis or edema and Homans sign is negative, no sign of DVT Wound: sternum stable  Lab Results: CBC: Recent Labs  01/01/16 1530 01/01/16 1543 01/02/16  0400  WBC 17.3*  --  17.2*  HGB 12.6 12.2 12.5  HCT 36.1 36.0 36.9  PLT 106*  --  132*   BMET:  Recent Labs  01/01/16 0452  01/01/16 1543 01/02/16 0400  NA 140  --  139 134*  K 3.9  --  5.0 4.6  CL 111  --  104 105  CO2 24  --   --  21*  GLUCOSE 116*  --  165* 151*  BUN 10  --  16 21*  CREATININE 0.56  < > 0.70 1.04*  CALCIUM 7.8*  --   --  8.6*  < > = values in this interval not displayed.  CMET: Lab Results  Component Value Date   WBC 17.2 (H) 01/02/2016   HGB 12.5 01/02/2016   HCT 36.9 01/02/2016   PLT 132 (L) 01/02/2016   GLUCOSE 151 (H) 01/02/2016   ALT 37 12/30/2015   AST 52 (H) 12/30/2015   NA 134 (L) 01/02/2016   K 4.6 01/02/2016   CL 105 01/02/2016   CREATININE 1.04  (H) 01/02/2016   BUN 21 (H) 01/02/2016   CO2 21 (L) 01/02/2016   TSH 1.503 12/30/2015   INR 1.37 12/31/2015   HGBA1C 5.9 (H) 12/30/2015    PT/INR:  Recent Labs  12/31/15 1434  LABPROT 17.0*  INR 1.37   Radiology: Dg Chest Port 1 View  Result Date: 01/02/2016 CLINICAL DATA:  Post CABG, shortness of Breath EXAM: PORTABLE CHEST 1 VIEW COMPARISON:  01/01/2016 FINDINGS: Small left apical pneumothorax, slightly decreased in size since prior study. Interval removal of Swan-Ganz catheter and left chest tube. Right central line is unchanged. There is cardiomegaly with bilateral layering effusions and bibasilar atelectasis, left greater than right appear IMPRESSION: Interval removal of left chest tube. Small residual left apical pneumothorax, smaller. Bibasilar atelectasis and small effusions, left greater than right. Electronically Signed   By: Rolm Baptise M.D.   On: 01/02/2016 07:47     Assessment/Plan: S/P Procedure(s) (LRB): CORONARY ARTERY BYPASS GRAFTING (CABG), ON PUMP, TIMES FOUR, USING LEFT INTERNAL MAMMARY ARTERY, RIGHT GREATER SAPHENOUS VEIN HARVESTED ENDOSCOPICALLY (N/A) TRANSESOPHAGEAL ECHOCARDIOGRAM (TEE) (N/A) Mobilize Diuresis Diabetes control Plan for transfer to step-down: see transfer orders     Grace Isaac 01/02/2016 8:18 AM

## 2016-01-02 NOTE — Anesthesia Postprocedure Evaluation (Signed)
Anesthesia Post Note  Patient: Sheena Rice  Procedure(s) Performed: Procedure(s) (LRB): CORONARY ARTERY BYPASS GRAFTING (CABG), ON PUMP, TIMES FOUR, USING LEFT INTERNAL MAMMARY ARTERY, RIGHT GREATER SAPHENOUS VEIN HARVESTED ENDOSCOPICALLY (N/A) TRANSESOPHAGEAL ECHOCARDIOGRAM (TEE) (N/A)  Patient location during evaluation: SICU Anesthesia Type: General Level of consciousness: sedated and patient remains intubated per anesthesia plan Pain management: pain level controlled Vital Signs Assessment: post-procedure vital signs reviewed and stable Respiratory status: patient remains intubated per anesthesia plan Cardiovascular status: stable Anesthetic complications: no    Last Vitals:  Vitals:   01/02/16 0420 01/02/16 0500  BP:  (!) 107/50  Pulse:  (!) 55  Resp:  15  Temp: 36.4 C     Last Pain:  Vitals:   01/02/16 0420  TempSrc: Oral  PainSc:                  Sheena Rice,Sheena Rice

## 2016-01-03 ENCOUNTER — Inpatient Hospital Stay (HOSPITAL_COMMUNITY): Payer: Commercial Managed Care - HMO

## 2016-01-03 LAB — BASIC METABOLIC PANEL
Anion gap: 8 (ref 5–15)
BUN: 19 mg/dL (ref 6–20)
CO2: 21 mmol/L — ABNORMAL LOW (ref 22–32)
Calcium: 8.5 mg/dL — ABNORMAL LOW (ref 8.9–10.3)
Chloride: 106 mmol/L (ref 101–111)
Creatinine, Ser: 0.66 mg/dL (ref 0.44–1.00)
GFR calc Af Amer: >60 mL/min (ref >60)
GFR calc non Af Amer: >60 mL/min (ref >60)
Glucose, Bld: 132 mg/dL — ABNORMAL HIGH (ref 65–99)
Potassium: 4.3 mmol/L (ref 3.5–5.1)
Sodium: 135 mmol/L (ref 135–145)

## 2016-01-03 LAB — TYPE AND SCREEN
BLOOD PRODUCT EXPIRATION DATE: 201712272359
BLOOD PRODUCT EXPIRATION DATE: 201712272359
BLOOD PRODUCT EXPIRATION DATE: 201712272359
Blood Product Expiration Date: 201712272359
Blood Product Expiration Date: 201712272359
Blood Product Expiration Date: 201712272359
ISSUE DATE / TIME: 201712140953
ISSUE DATE / TIME: 201712140953
ISSUE DATE / TIME: 201712141241
ISSUE DATE / TIME: 201712141241
UNIT TYPE AND RH: 5100
UNIT TYPE AND RH: 5100
UNIT TYPE AND RH: 5100
UNIT TYPE AND RH: 5100
Unit Type and Rh: 5100
Unit Type and Rh: 5100

## 2016-01-03 LAB — CBC
HCT: 32.9 % — ABNORMAL LOW (ref 36.0–46.0)
Hemoglobin: 11.4 g/dL — ABNORMAL LOW (ref 12.0–15.0)
MCH: 30.4 pg (ref 26.0–34.0)
MCHC: 34.7 g/dL (ref 30.0–36.0)
MCV: 87.7 fL (ref 78.0–100.0)
Platelets: 122 10*3/uL — ABNORMAL LOW (ref 150–400)
RBC: 3.75 MIL/uL — ABNORMAL LOW (ref 3.87–5.11)
RDW: 13.5 % (ref 11.5–15.5)
WBC: 12.9 10*3/uL — ABNORMAL HIGH (ref 4.0–10.5)

## 2016-01-03 LAB — GLUCOSE, CAPILLARY
GLUCOSE-CAPILLARY: 125 mg/dL — AB (ref 65–99)
Glucose-Capillary: 80 mg/dL (ref 65–99)

## 2016-01-03 MED ORDER — LISINOPRIL 5 MG PO TABS
5.0000 mg | ORAL_TABLET | Freq: Every day | ORAL | Status: DC
  Administered 2016-01-03 – 2016-01-05 (×3): 5 mg via ORAL
  Filled 2016-01-03 (×3): qty 1

## 2016-01-03 MED ORDER — SODIUM CHLORIDE 0.9% FLUSH: 10.0000 mL | INTRAVENOUS | Status: DC | PRN

## 2016-01-03 MED ORDER — FUROSEMIDE 40 MG PO TABS
40.0000 mg | ORAL_TABLET | Freq: Every day | ORAL | Status: AC
  Administered 2016-01-03 – 2016-01-04 (×2): 40 mg via ORAL
  Filled 2016-01-03 (×2): qty 1

## 2016-01-03 MED ORDER — ACETAMINOPHEN 325 MG PO TABS
650.0000 mg | ORAL_TABLET | Freq: Four times a day (QID) | ORAL | Status: DC | PRN
  Administered 2016-01-03 – 2016-01-05 (×7): 650 mg via ORAL
  Filled 2016-01-03 (×7): qty 2

## 2016-01-03 NOTE — Progress Notes (Addendum)
      [address]       [place] [address]             [phone]        3 Days Post-Op Procedure(s) (LRB): CORONARY ARTERY BYPASS GRAFTING (CABG), ON PUMP, TIMES FOUR, USING LEFT INTERNAL MAMMARY ARTERY, RIGHT GREATER SAPHENOUS VEIN HARVESTED ENDOSCOPICALLY (N/A) TRANSESOPHAGEAL ECHOCARDIOGRAM (TEE) (N/A)  Subjective: Patient with some incisional pain and constipation.  Objective: Vital signs in last 24 hours: Temp:  [96.4 F (35.8 C)-99.3 F (37.4 C)] 98.4 F (36.9 C) (12/17 0514) Pulse Rate:  [63-67] 63 (12/17 0514) Cardiac Rhythm: Normal sinus rhythm (12/16 2130) Resp:  [18-25] 18 (12/17 0514) BP: (131-180)/(50-75) 131/50 (12/17 0514) SpO2:  [90 %-96 %] 90 % (12/17 0514) Weight:  [149 lb 6.4 oz (67.8 kg)] 149 lb 6.4 oz (67.8 kg) (12/17 0304)  Pre op weight 60.4 kg Current Weight  01/03/16 149 lb 6.4 oz (67.8 kg)       Intake/Output from previous day: 12/16 0701 - 12/17 0700 In: 240 [P.O.:240] Out: 125 [Urine:125]   Physical Exam: BP (!) 156/79   Pulse 63   Temp 98.4 F (36.9 C) (Oral)   Resp 18   Ht 5\' 4"  (1.626 m)   Wt 149 lb 6.4 oz (67.8 kg)   SpO2 90%   BMI 25.64 kg/m   Cardiovascular: RRR Pulmonary: Slightly diminished at bases Abdomen: Soft, non tender, bowel sounds present. Extremities: Mild bilateral lower extremity edema. Wounds: Sternal dressing removed and wound is clean and dry.  No erythema or signs of infection. RLE wound is clean and dry.  Lab Results: CBC: Recent Labs  01/02/16 0400 01/03/16 0523  WBC 17.2* 12.9*  HGB 12.5 11.4*  HCT 36.9 32.9*  PLT 132* 122*   BMET:  Recent Labs  01/02/16 0400 01/03/16 0523  NA 134* 135  K 4.6 4.3  CL 105 106  CO2 21* 21*  GLUCOSE 151* 132*  BUN 21* 19  CREATININE 1.04* 0.66  CALCIUM 8.6* 8.5*    PT/INR:  Lab Results  Component Value Date   INR 1.37 12/31/2015   INR 1.00 12/30/2015   INR 1.07 12/29/2015   ABG:  INR: Will add last result for INR, ABG  once components are confirmed Will add last 4 CBG results once components are confirmed  Assessment/Plan:  1. CV - SB/SR with HR low 60's. SBP in the 160's. On Lopressor 25 mg bid. Will start Lisinopril for better BP control. 2.  Pulmonary - On 2 liters of oxygen via [place]. Wean to room air as tolerates. CXR this am shows tiny, decreased left apical pneumothorax, small bilateral pleural effusions, and bibasilar atelectasis. Encourage incentive spirometer. 3. Volume Overload - On Lasix 20 mg orally daily. Will increase to 40 mg daily. 4.  Acute blood loss anemia - H and H 11.4 and 32.9 this am 5. Mild thrombocytopenia-platelets slightly decreased to 122,000. 6. CBGs 91/143/125. Pre op HGA1C 5.9. She is likely pre diabetic. Will stop accu checks and SS PRN. 7. Remove EPW in am  ZIMMERMAN,DONIELLE MPA-C 01/03/2016,8:36 AM Poss home tue or wed I have seen and examined Marcelle Smiling and agree with the above assessment  and plan.  Grace Isaac MD Beeper [phone] Office [phone] 01/03/2016 12:19 PM

## 2016-01-03 NOTE — Progress Notes (Signed)
Patient ambulated x2 today >375ft and tolerated it well.

## 2016-01-04 DIAGNOSIS — Z951 Presence of aortocoronary bypass graft: Secondary | ICD-10-CM

## 2016-01-04 NOTE — Progress Notes (Addendum)
      [address]       [place] [address]             [phone]      4 Days Post-Op Procedure(s) (LRB): CORONARY ARTERY BYPASS GRAFTING (CABG), ON PUMP, TIMES FOUR, USING LEFT INTERNAL MAMMARY ARTERY, RIGHT GREATER SAPHENOUS VEIN HARVESTED ENDOSCOPICALLY (N/A) TRANSESOPHAGEAL ECHOCARDIOGRAM (TEE) (N/A)   Subjective:  Patient has no complaints.  States she is feeling pretty good.  She looks good, is ambulating.  + BM  Objective: Vital signs in last 24 hours: Temp:  [97.8 F (36.6 C)-98.4 F (36.9 C)] 97.8 F (36.6 C) (12/18 0615) Pulse Rate:  [62-68] 62 (12/18 0615) Cardiac Rhythm: Normal sinus rhythm (12/17 2100) Resp:  [16-18] 16 (12/18 0615) BP: (124-156)/(49-79) 124/49 (12/18 0615) SpO2:  [94 %-98 %] 94 % (12/18 0615) Weight:  [140 lb 1.6 oz (63.5 kg)] 140 lb 1.6 oz (63.5 kg) (12/18 0615)  General appearance: alert, cooperative and no distress Heart: regular rate and rhythm Lungs: diminished breath sounds bibasilar Abdomen: soft, non-tender; bowel sounds normal; no masses,  no organomegaly Extremities: edema trace R >L Wound: clean and dry  Lab Results:  Recent Labs  01/02/16 0400 01/03/16 0523  WBC 17.2* 12.9*  HGB 12.5 11.4*  HCT 36.9 32.9*  PLT 132* 122*   BMET:  Recent Labs  01/02/16 0400 01/03/16 0523  NA 134* 135  K 4.6 4.3  CL 105 106  CO2 21* 21*  GLUCOSE 151* 132*  BUN 21* 19  CREATININE 1.04* 0.66  CALCIUM 8.6* 8.5*    PT/INR: No results for input(s): LABPROT, INR in the last 72 hours. ABG    Component Value Date/Time   PHART 7.306 (L) 12/31/2015 2131   HCO3 21.0 12/31/2015 2131   TCO2 21 01/01/2016 1543   ACIDBASEDEF 5.0 (H) 12/31/2015 2131   O2SAT 97.0 12/31/2015 2131   CBG (last 3)   Recent Labs  01/02/16 2144 01/03/16 0632 01/03/16 1157  GLUCAP 143* 125* 80    Assessment/Plan: S/P Procedure(s) (LRB): CORONARY ARTERY BYPASS GRAFTING (CABG), ON PUMP, TIMES FOUR, USING LEFT INTERNAL MAMMARY ARTERY,  RIGHT GREATER SAPHENOUS VEIN HARVESTED ENDOSCOPICALLY (N/A) TRANSESOPHAGEAL ECHOCARDIOGRAM (TEE) (N/A)  1. CV- Sinus Brady, occasional PVCs- tolerating Lopressor, continue ACE for HTN 2. Pulm- no acute issues, good use of IS/Flutter valve 3. Renal-weight is trending down, continue Lasix 4. Thrombocytopenia, mild repeat labs in AM 5. Dispo- patient stable, will d/c EPW, continue Lopressor, ACE, repeat labs in AM   LOS: 5 days    Rice, Sheena 01/04/2016  Pacing wires out today Likely home in the am I have seen and examined Sheena Rice and agree with the above assessment  and plan.  Grace Isaac MD Beeper [phone] Office [phone] 01/04/2016 9:03 AM

## 2016-01-04 NOTE — Progress Notes (Signed)
Patient ambulated in hallway with family back in chair will monitor patient. Liviya Santini, Bettina Gavia RN

## 2016-01-04 NOTE — Progress Notes (Addendum)
CARDIAC REHAB PHASE I   PRE:  Rate/Rhythm: 60 SR  BP:  Sitting: 141/51        SaO2: 99 RA  MODE:  Ambulation: 300 ft   POST:  Rate/Rhythm: 67 SR  BP:  Sitting: 139/55         SaO2: 100 RA  Pt recently returned from ambulating with family, offered to return later, agreeable to walk again. Pt ambulated 300 ft on RA, rolling walker, steady gait, tolerated well. Pt c/o DOE, fatigue by end of walk, denies any other complaints, declined rest stop. Pt to recliner after walk, feet elevated, call bell within reach. Will follow.    [place], RN, BSN 01/04/2016 1:42 PM

## 2016-01-04 NOTE — Progress Notes (Signed)
Epw pulled per protocol and as ordered. All ends intact, slight resistance met with left ventricle wires. Patient tolerated well however did state it hurt/ was uncomfortable. Patient reminded to lie supine approximately one hour. Patient BP 148/54 pt SR 67 on monitor will continue to monitor patient. Mimi Debellis, Bettina Gavia RN

## 2016-01-04 NOTE — Progress Notes (Signed)
Patient ambulated in hallway with spouse, back in room will monitor patient. Stepfon Rawles, Bettina Gavia  rN

## 2016-01-04 NOTE — Discharge Instructions (Signed)
Coronary Artery Bypass Grafting, Care After °Refer to this sheet in the next few weeks. These instructions provide you with information on caring for yourself after your procedure. Your health care provider may also give you more specific instructions. Your treatment has been planned according to current medical practices, but problems sometimes occur. Call your health care provider if you have any problems or questions after your procedure. °WHAT TO EXPECT AFTER THE PROCEDURE °Recovery from surgery will be different for everyone. Some people feel well after 3 or 4 weeks, while for others it takes longer. After your procedure, it is typical to have the following: °· Nausea and a lack of appetite.   °· Constipation. °· Weakness and fatigue.   °· Depression or irritability.   °· Pain or discomfort at your incision site. °HOME CARE INSTRUCTIONS °· Take medicines only as directed by your health care provider. Do not stop taking medicines or start any new medicines without first checking with your health care provider. °· Take your pulse as directed by your health care provider. °· Perform deep breathing as directed by your health care provider. If you were given a device called an incentive spirometer, use it to practice deep breathing several times a day. Support your chest with a pillow or your arms when you take deep breaths or cough. °· Keep incision areas clean, dry, and protected. Remove or change any bandages (dressings) only as directed by your health care provider. You may have skin adhesive strips over the incision areas. Do not take the strips off. They will fall off on their own. °· Check incision areas daily for any swelling, redness, or drainage. °· If incisions were made in your legs, do the following: °¨ Avoid crossing your legs.   °¨ Avoid sitting for long periods of time. Change positions every 30 minutes.   °¨ Elevate your legs when you are sitting. °· Wear compression stockings as directed by your  health care provider. These stockings help keep blood clots from forming in your legs. °· Take showers once your health care provider approves. Until then, only take sponge baths. Pat incisions dry. Do not rub incisions with a washcloth or towel. Do not take baths, swim, or use a hot tub until your health care provider approves. °· Eat foods that are high in fiber, such as raw fruits and vegetables, whole grains, beans, and nuts. Meats should be lean cut. Avoid canned, processed, and fried foods. °· Drink enough fluid to keep your urine clear or pale yellow. °· Weigh yourself every day. This helps identify if you are retaining fluid that may make your heart and lungs work harder. °· Rest and limit activity as directed by your health care provider. You may be instructed to: °¨ Stop any activity at once if you have chest pain, shortness of breath, irregular heartbeats, or dizziness. Get help right away if you have any of these symptoms. °¨ Move around frequently for short periods or take short walks as directed by your health care provider. Increase your activities gradually. You may need physical therapy or cardiac rehabilitation to help strengthen your muscles and build your endurance. °¨ Avoid lifting, pushing, or pulling anything heavier than 10 lb (4.5 kg) for at least 6 weeks after surgery. °· Do not drive until your health care provider approves.  °· Ask your health care provider when you may return to work. °· Ask your health care provider when you may resume sexual activity. °· Keep all follow-up visits as directed by your health care   provider. This is important. °SEEK MEDICAL CARE IF: °· You have swelling, redness, increasing pain, or drainage at the site of an incision. °· You have a fever. °· You have swelling in your ankles or legs. °· You have pain in your legs.   °· You gain 2 or more pounds (0.9 kg) a day. °· You are nauseous or vomit. °· You have diarrhea.  °SEEK IMMEDIATE MEDICAL CARE IF: °· You have  chest pain that goes to your jaw or arms. °· You have shortness of breath.   °· You have a fast or irregular heartbeat.   °· You notice a "clicking" in your breastbone (sternum) when you move.   °· You have numbness or weakness in your arms or legs. °· You feel dizzy or light-headed.   °MAKE SURE YOU: °· Understand these instructions. °· Will watch your condition. °· Will get help right away if you are not doing well or get worse. °This information is not intended to replace advice given to you by your health care provider. Make sure you discuss any questions you have with your health care provider. °Document Released: 07/23/2004 Document Revised: 01/24/2014 Document Reviewed: 06/12/2012 °Elsevier Interactive Patient Education © 2017 [employer] ° ° °Endoscopic Saphenous Vein Harvesting, Care After °Introduction °Refer to this sheet in the next few weeks. These instructions provide you with information about caring for yourself after your procedure. Your health care provider may also give you more specific instructions. Your treatment has been planned according to current medical practices, but problems sometimes occur. Call your health care provider if you have any problems or questions after your procedure. °What can I expect after the procedure? °After the procedure, it is common to have: °· Pain. °· Bruising. °· Swelling. °· Numbness. °Follow these instructions at home: °Medicine °· Take over-the-counter and prescription medicines only as told by your health care provider. °· Do not drive or operate heavy machinery while taking prescription pain medicine. °Incision care °· Follow instructions from your health care provider about how to take care of the cut made during surgery (incision). Make sure you: °¨ Wash your hands with soap and water before you change your bandage (dressing). If soap and water are not available, use hand sanitizer. °¨ Change your dressing as told by your health care provider. °¨ Leave  stitches (sutures), skin glue, or adhesive strips in place. These skin closures may need to be in place for 2 weeks or longer. If adhesive strip edges start to loosen and curl up, you may trim the loose edges. Do not remove adhesive strips completely unless your health care provider tells you to do that. °· Check your incision area every day for signs of infection. Check for: °¨ More redness, swelling, or pain. °¨ More fluid or blood. °¨ Warmth. °¨ Pus or a bad smell. °General instructions °· Raise (elevate) your legs above the level of your heart while you are sitting or lying down. °· Do any exercises your health care providers have given you. These may include deep breathing, coughing, and walking exercises. °· Do not shower, take baths, swim, or use a hot tub unless told by your health care provider. °· Wear your elastic stocking if told by your health care provider. °· Keep all follow-up visits as told by your health care provider. This is important. °Contact a health care provider if: °· Medicine does not help your pain. °· Your pain gets worse. °· You have new leg bruises or your leg bruises get bigger. °· You have   a fever. °· Your leg feels numb. °· You have more redness, swelling, or pain around your incision. °· You have more fluid or blood coming from your incision. °· Your incision feels warm to the touch. °· You have pus or a bad smell coming from your incision. °Get help right away if: °· Your pain is severe. °· You develop pain, tenderness, warmth, redness, or swelling in any part of your leg. °· You have chest pain. °· You have trouble breathing. °This information is not intended to replace advice given to you by your health care provider. Make sure you discuss any questions you have with your health care provider. °Document Released: 09/15/2010 Document Revised: 06/11/2015 Document Reviewed: 11/17/2014 °© 2017 Elsevier ° ° °

## 2016-01-04 NOTE — Progress Notes (Signed)
Pt is getting ready for bed, and wants to rest. Flutter provided and left at bedside table. Will start in the morning. Education given to patient/family on the proper use of the device. No distress noted patient is stable at this time.

## 2016-01-04 NOTE — Care Management Important Message (Signed)
Important Message  Patient Details  Name: Sheena Rice MRN: [id number] Date of Birth: 1940/03/07   Medicare Important Message Given:  Yes    Yossef Gilkison Abena 01/04/2016, 10:50 AM

## 2016-01-04 NOTE — Discharge Summary (Signed)
Physician Discharge Summary  Patient ID: Sheena Rice MRN: [id number] DOB/AGE: 1940/03/10 75 y.o.  Admit date: 12/30/2015 Discharge date: 01/05/2016  Admission Diagnoses:  Patient Active Problem List   Diagnosis Date Noted  . Acute coronary syndrome ([place]) 12/30/2015  . Unstable angina ([place]) 12/29/2015   Discharge Diagnoses:   Patient Active Problem List   Diagnosis Date Noted  . S/P CABG x 4 01/04/2016  . Acute coronary syndrome ([place]) 12/30/2015  . Unstable angina ([place]) 12/29/2015   Discharged Condition: good  History of Present Illness:  Sheena Rice is a 75 yo white female with known history of chronic fatigue syndrome, Hypertension, GERD, and arthritis.  She presented to her PCP with complaints of chest pain over the last few months.  However, over the past several weeks the pain became more intense and was lasting longer in duration.  The pain also radiates to her left arm.  The pain is described as being sharp in nature and is worsened with activity.  She was referred to Dr. Ubaldo Glassing who to the patient to the catheterization lab.  She was found to have multivessel CAD with a preserved EF.  It was felt coronary bypass grafting would be indicated.  She was placed on Heparin.  She was transferred to [facility] for further care.    Hospital Course:   The patient was chest pain free on arrival to [facility].  She was evaluated by Dr. Roxan Hockey who was in agreement the patient would require coronary bypass grafting procedure.  The risks and benefits of the procedure were explained to the patient and she was agreeable to proceed.  She was taken to the operating room on 12/31/2015.  She underwent CABG x 4 utilizing LIMA to LAD, SVG to OM, SVG to Diagonal which was a Y graft off OM vein, and SVG to PDA.  She also underwent endoscopic harvest of greater saphenous vein from her right leg.  She tolerated the procedure without difficulty and was taken to the SICU in stable  condition.  She was extubated the evening of surgery.  During her stay in the SICU the patient was weaned off Dopamine as tolerated.  Her chest tubes and arterial lines were removed without difficulty.  She was hypertensive and her oral lopressor was titrated as tolerated.  She was ambulating in the SICU without difficulty and was felt medically stable for transfer to the telemetry unit in stable condition.  The patient continues to make progress.  She is maintaining NSR with occasional PVCS.  Her pacing wires were removed without difficulty.  She continued to have issues with hypertension and was started on an ACE inhibitor.  She developed thrombocytopenia with most recent platelet count being 176,000.  She continues to ambulate independently.  She is tolerating a diet and her pain is well controlled.  She is felt medically stable for discharge home today.     Significant Diagnostic Studies: angiography:    The left ventricular systolic function is normal.  LV end diastolic pressure is normal.  Prox Cx to Mid Cx lesion, 90 %stenosed.  Ost 1st Mrg to 1st Mrg lesion, 90 %stenosed.  Mid LAD lesion, 90 %stenosed.  Ost 2nd Diag to 2nd Diag lesion, 90 %stenosed.  Dist RCA lesion, 95 %stenosed.   3 vessel cad with 99% rca, 90% proximal lad involving bifurcation into D1; 90% mid circumflex lesion involving take off of 0M2.  EF normal  Treatments: surgery:   Median sternotomy, extracorporeal circulation, coronary  artery bypass grafting x4 (left internal mammary artery to left anterior descending, saphenous vein graft to obtuse marginal 2, saphenous vein graft to first diagonal (Y-graft off OM vein), saphenous vein graft to posterior descending), endoscopic vein harvest, right leg.  Disposition: Home  Discharge Medications:  The patient has been discharged on:   1.Beta Blocker:  Yes [x   ]                              No   [   ]                              If No, reason:  2.Ace  Inhibitor/ARB: Yes [ x  ]                                     No  [    ]                                     If No, reason:  3.Statin:   Yes [   ]                  No  [ x  ]                  If No, reason: Intolerance due to myalgias  4.[place]:  Yes  [ x  ]                  No   [   ]                  If No, reason:    Allergies as of 01/05/2016      Reactions   Pravastatin Other (See Comments)   Muscle pain "STATIN" Drugs      Medication List    STOP taking these medications   heparin 100-0.45 UNIT/ML-% infusion   sodium chloride 0.9 % infusion   sodium chloride flush 0.9 % Soln Commonly known as:  NS     TAKE these medications   acetaminophen 325 MG tablet Commonly known as:  TYLENOL Take 2 tablets (650 mg total) by mouth every 6 (six) hours as needed for mild pain or headache.   aspirin 81 MG EC tablet Take 1 tablet (81 mg total) by mouth daily.   colestipol 1 g tablet Commonly known as:  COLESTID Take 2 tablets by mouth 2 (two) times daily.   cyclobenzaprine 10 MG tablet Commonly known as:  FLEXERIL Take 10 mg by mouth at bedtime.   esomeprazole 20 MG capsule Commonly known as:  NEXIUM Take 20 mg by mouth every morning.   FLAX SEED OIL PO Take 1,400 mg by mouth daily.   furosemide 40 MG tablet Commonly known as:  LASIX Take 1 tablet (40 mg total) by mouth daily. For 7 Days   gemfibrozil 600 MG tablet Commonly known as:  LOPID Take 600 mg by mouth 2 (two) times daily.   guaiFENesin 600 MG 12 hr tablet Commonly known as:  MUCINEX Take 1 tablet (600 mg total) by mouth every 12 (twelve) hours as needed for cough.   lisinopril 10 MG tablet Commonly known as:  PRINIVIL,ZESTRIL  Take 1 tablet (10 mg total) by mouth daily.   Magnesium Oxide 400 MG Caps Take 400 mg by mouth 2 (two) times daily.   metoprolol tartrate 25 MG tablet Commonly known as:  LOPRESSOR Take 1 tablet (25 mg total) by mouth 2 (two) times daily. What changed:  how much to  take   Potassium Chloride ER 20 MEQ Tbcr Take 20 mEq by mouth daily. For 7 Days   PROBIOTIC DAILY Caps Take 1 capsule by mouth daily.   traMADol 50 MG tablet Commonly known as:  ULTRAM Take 1 tablet (50 mg total) by mouth every 6 (six) hours as needed for moderate pain.   TRIPLE ANTIBIOTIC EX Apply 1 application topically as needed.   Vitamin D3 5000 units Caps Take 5,000 Units by mouth daily.      Follow-up Information    [name], MD Follow up on 02/02/2016.   Specialty:  Cardiothoracic Surgery Why:  Appointment is at 1:00,  please get CXR at [facility] imaging at 12:30 located on first floor on our office building  Contact information: [place] [address] [phone]        Teodoro Spray, MD Follow up.   Specialty:  Cardiology Why:  Please contact office at discharge to set up 2-3 week hospital follow up Contact information: [address] [place] [address] [phone]           Signed: Ellwood Handler 01/05/2016, 8:02 AM

## 2016-01-05 LAB — CBC
HCT: 34.5 % — ABNORMAL LOW (ref 36.0–46.0)
Hemoglobin: 11.7 g/dL — ABNORMAL LOW (ref 12.0–15.0)
MCH: 30.2 pg (ref 26.0–34.0)
MCHC: 33.9 g/dL (ref 30.0–36.0)
MCV: 88.9 fL (ref 78.0–100.0)
PLATELETS: 176 10*3/uL (ref 150–400)
RBC: 3.88 MIL/uL (ref 3.87–5.11)
RDW: 13.6 % (ref 11.5–15.5)
WBC: 7.7 10*3/uL (ref 4.0–10.5)

## 2016-01-05 MED ORDER — GUAIFENESIN ER 600 MG PO TB12: 600.0000 mg | ORAL_TABLET | Freq: Two times a day (BID) | ORAL | Status: DC | PRN

## 2016-01-05 MED ORDER — LISINOPRIL 10 MG PO TABS: 10.0000 mg | ORAL_TABLET | Freq: Every day | ORAL | 3 refills | Status: DC

## 2016-01-05 MED ORDER — FUROSEMIDE 40 MG PO TABS: 40.0000 mg | ORAL_TABLET | Freq: Every day | ORAL | 0 refills | Status: DC

## 2016-01-05 MED ORDER — POTASSIUM CHLORIDE ER 20 MEQ PO TBCR: 20.0000 meq | EXTENDED_RELEASE_TABLET | Freq: Every day | ORAL | 0 refills | Status: DC

## 2016-01-05 MED ORDER — METOPROLOL TARTRATE 25 MG PO TABS: 25.0000 mg | ORAL_TABLET | Freq: Two times a day (BID) | ORAL | 3 refills | Status: DC

## 2016-01-05 MED ORDER — ACETAMINOPHEN 325 MG PO TABS: 650.0000 mg | ORAL_TABLET | Freq: Four times a day (QID) | ORAL | Status: DC | PRN

## 2016-01-05 MED ORDER — TRAMADOL HCL 50 MG PO TABS: 50.0000 mg | ORAL_TABLET | Freq: Four times a day (QID) | ORAL | 0 refills | Status: DC | PRN

## 2016-01-05 NOTE — Progress Notes (Signed)
      [address]       [place] [address]             [phone]      5 Days Post-Op Procedure(s) (LRB): CORONARY ARTERY BYPASS GRAFTING (CABG), ON PUMP, TIMES FOUR, USING LEFT INTERNAL MAMMARY ARTERY, RIGHT GREATER SAPHENOUS VEIN HARVESTED ENDOSCOPICALLY (N/A) TRANSESOPHAGEAL ECHOCARDIOGRAM (TEE) (N/A)   Subjective:  Sheena Rice has no complaints.  She continues to do well and is ready to go home.  She is ambulating independently.  + BM  Objective: Vital signs in last 24 hours: Temp:  [98 F (36.7 C)-98.2 F (36.8 C)] 98.1 F (36.7 C) (12/19 0536) Pulse Rate:  [61-67] 64 (12/19 0536) Cardiac Rhythm: Normal sinus rhythm (12/18 1900) Resp:  [18] 18 (12/19 0536) BP: (124-148)/(50-63) 141/50 (12/19 0536) SpO2:  [92 %-99 %] 92 % (12/19 0536) Weight:  [143 lb 3.2 oz (65 kg)] 143 lb 3.2 oz (65 kg) (12/19 0536)  Intake/Output from previous day: 12/18 0701 - 12/19 0700 In: 480 [P.O.:480] Out: -   General appearance: alert, cooperative and no distress Heart: regular rate and rhythm Lungs: clear to auscultation bilaterally Abdomen: soft, non-tender; bowel sounds normal; no masses,  no organomegaly Extremities: edema trace, ecchymosis of RLE Wound: clean and dry  Lab Results:  Recent Labs  01/03/16 0523 01/05/16 0236  WBC 12.9* 7.7  HGB 11.4* 11.7*  HCT 32.9* 34.5*  PLT 122* 176   BMET:   Recent Labs  01/03/16 0523  NA 135  K 4.3  CL 106  CO2 21*  GLUCOSE 132*  BUN 19  CREATININE 0.66  CALCIUM 8.5*    PT/INR: No results for input(s): LABPROT, INR in the last 72 hours. ABG    Component Value Date/Time   PHART 7.306 (L) 12/31/2015 2131   HCO3 21.0 12/31/2015 2131   TCO2 21 01/01/2016 1543   ACIDBASEDEF 5.0 (H) 12/31/2015 2131   O2SAT 97.0 12/31/2015 2131   CBG (last 3)   Recent Labs  01/02/16 2144 01/03/16 0632 01/03/16 1157  GLUCAP 143* 125* 80    Assessment/Plan: S/P Procedure(s) (LRB): CORONARY ARTERY BYPASS GRAFTING  (CABG), ON PUMP, TIMES FOUR, USING LEFT INTERNAL MAMMARY ARTERY, RIGHT GREATER SAPHENOUS VEIN HARVESTED ENDOSCOPICALLY (N/A) TRANSESOPHAGEAL ECHOCARDIOGRAM (TEE) (N/A)  1. CV- NSR, continue occasional PVC- will continue Lopressor, titrate Lisinopril 2. Pulm- no acute issues, continue IS 3. Renal- creatinine WNL, remains hypervolemic will give a short course of Lasix at discharge 4. Thrombocytopenia- improving, PLT count at 176K 5. Dispo- patient stable, will d/c home today   LOS: 6 days    Sheena Rice 01/05/2016

## 2016-01-05 NOTE — Progress Notes (Signed)
0945-1025 Education completed with pt, daughter and pt's husband who all voiced understanding. Encouraged IS. Reviewed watching carbs with A1C of 5.9. Discussed heart healthy diet. Ex ed given and discussed CRP 2. Will send a letter of interest to [facility] but cannot write order for Dr Ubaldo Glassing. Did not want to watch discharge video as she felt comfortable with info received. Daughter is Therapist, sports. Graylon Good RN BSN 01/05/2016 10:24 AM

## 2016-01-05 NOTE — Care Management Note (Signed)
Case Management Note Marvetta Gibbons RN, BSN Unit 2W-Case Manager [phone]  Patient Details  Name: Sheena Rice MRN: [id number] Date of Birth: [birth year]  Subjective/Objective: Pt admitted with ACS s/p CABG- tx from ICU to 2W on 01/02/16                 Action/Plan: PTA pt lived at home- anticipate return home- no CM needs noted.   Expected Discharge Date:    01/05/16              Expected Discharge Plan:  Home/Self Care  In-House Referral:     Discharge planning Services  CM Consult  Post Acute Care Choice:  NA Choice offered to:  NA  DME Arranged:    DME Agency:     HH Arranged:    HH Agency:     Status of Service:  Completed, signed off  If discussed at [employer] of Stay Meetings, dates discussed:    Additional Comments:  Dawayne Patricia, RN 01/05/2016, 9:54 AM

## 2016-01-29 DIAGNOSIS — I249 Acute ischemic heart disease, unspecified: Secondary | ICD-10-CM | POA: Diagnosis not present

## 2016-01-29 DIAGNOSIS — I25810 Atherosclerosis of coronary artery bypass graft(s) without angina pectoris: Secondary | ICD-10-CM | POA: Diagnosis not present

## 2016-01-29 DIAGNOSIS — E7800 Pure hypercholesterolemia, unspecified: Secondary | ICD-10-CM | POA: Diagnosis not present

## 2016-01-29 DIAGNOSIS — I2510 Atherosclerotic heart disease of native coronary artery without angina pectoris: Secondary | ICD-10-CM | POA: Diagnosis not present

## 2016-01-29 DIAGNOSIS — I10 Essential (primary) hypertension: Secondary | ICD-10-CM | POA: Diagnosis not present

## 2016-01-29 DIAGNOSIS — Z951 Presence of aortocoronary bypass graft: Secondary | ICD-10-CM | POA: Diagnosis not present

## 2016-02-01 ENCOUNTER — Other Ambulatory Visit: Payer: Self-pay | Admitting: Thoracic Surgery (Cardiothoracic Vascular Surgery)

## 2016-02-01 DIAGNOSIS — Z951 Presence of aortocoronary bypass graft: Secondary | ICD-10-CM

## 2016-02-02 ENCOUNTER — Ambulatory Visit
Admission: RE | Admit: 2016-02-02 | Discharge: 2016-02-02 | Disposition: A | Discharge status: Home / Self Care | Payer: Commercial Managed Care - HMO | Source: Ambulatory Visit | Attending: Thoracic Surgery (Cardiothoracic Vascular Surgery) | Admitting: Thoracic Surgery (Cardiothoracic Vascular Surgery)

## 2016-02-02 ENCOUNTER — Encounter: Payer: Self-pay | Admitting: Thoracic Surgery (Cardiothoracic Vascular Surgery)

## 2016-02-02 ENCOUNTER — Ambulatory Visit (INDEPENDENT_AMBULATORY_CARE_PROVIDER_SITE_OTHER): Payer: Self-pay | Admitting: Thoracic Surgery (Cardiothoracic Vascular Surgery)

## 2016-02-02 VITALS — BP 123/75 | HR 72 | Resp 20 | Ht 64.0 in | Wt 128.0 lb

## 2016-02-02 DIAGNOSIS — Z951 Presence of aortocoronary bypass graft: Secondary | ICD-10-CM

## 2016-02-02 DIAGNOSIS — R05 Cough: Secondary | ICD-10-CM | POA: Diagnosis not present

## 2016-02-02 NOTE — Progress Notes (Signed)
[address]       [place] [address]             [phone]    HPI: Sheena Rice returns for a scheduled postoperative follow-up visit  Sheena Rice is a 76 year old woman who underwent coronary bypass grafting 4 on 12/31/2015 after presenting with unstable angina. She had some mild thrombocytopenia postoperatively but that returned to normal prior to discharge.  Since discharge she has been feeling well. She has had some mild incisional pain. She's taking about 3 Tylenols a day for that. She is not taking any narcotics. She has not had any anginal pain. She is walking about 35-40 minutes a day. She does feel short of breath when she lies flat on her back  She saw Dr. Ubaldo Glassing last week. He stopped her lisinopril due to hypotension.  Past Medical History:  Diagnosis Date  . Arthritis    OA  . Chronic fatigue   . Coronary artery disease   . GERD (gastroesophageal reflux disease)   . Hypertension   . Unstable angina [facility])     Current Outpatient Prescriptions  Medication Sig Dispense Refill  . acetaminophen (TYLENOL) 325 MG tablet Take 2 tablets (650 mg total) by mouth every 6 (six) hours as needed for mild pain or headache.    Marland Kitchen aspirin EC 81 MG EC tablet Take 1 tablet (81 mg total) by mouth daily. 30 tablet 0  . Cholecalciferol (VITAMIN D3) 5000 units CAPS Take 5,000 Units by mouth daily.     . colestipol (COLESTID) 1 g tablet Take 2 tablets by mouth 2 (two) times daily.    . cyclobenzaprine (FLEXERIL) 10 MG tablet Take 10 mg by mouth at bedtime.     Marland Kitchen esomeprazole (NEXIUM) 20 MG capsule Take 20 mg by mouth every morning.     . Flaxseed, Linseed, (FLAX SEED OIL PO) Take 1,400 mg by mouth daily.    . furosemide (LASIX) 40 MG tablet Take 1 tablet (40 mg total) by mouth daily. For 7 Days 7 tablet 0  . gemfibrozil (LOPID) 600 MG tablet Take 600 mg by mouth 2 (two) times daily.     Marland Kitchen guaiFENesin (MUCINEX) 600 MG 12 hr tablet Take 1 tablet (600 mg total) by mouth every  12 (twelve) hours as needed for cough.    Marland Kitchen lisinopril (PRINIVIL,ZESTRIL) 10 MG tablet Take 1 tablet (10 mg total) by mouth daily. 30 tablet 3  . Magnesium Oxide 400 MG CAPS Take 400 mg by mouth 2 (two) times daily.     . metoprolol tartrate (LOPRESSOR) 25 MG tablet Take 1 tablet (25 mg total) by mouth 2 (two) times daily. 30 tablet 3  . Neomycin-Bacitracin-Polymyxin (TRIPLE ANTIBIOTIC EX) Apply 1 application topically as needed.    . potassium chloride 20 MEQ TBCR Take 20 mEq by mouth daily. For 7 Days 7 tablet 0  . Probiotic Product (PROBIOTIC DAILY) CAPS Take 1 capsule by mouth daily.    . traMADol (ULTRAM) 50 MG tablet Take 1 tablet (50 mg total) by mouth every 6 (six) hours as needed for moderate pain. 30 tablet 0   No current facility-administered medications for this visit.    Facility-Administered Medications Ordered in Other Visits  Medication Dose Route Frequency Provider Last Rate Last Dose  . 0.9 %  sodium chloride infusion  250 mL Intravenous PRN Teodoro Spray, MD      . 0.9% sodium chloride infusion  1 mL/kg/hr Intravenous Continuous  Teodoro Spray, MD      . sodium chloride flush (NS) 0.9 % injection 3 mL  3 mL Intravenous Q12H Teodoro Spray, MD      . sodium chloride flush (NS) 0.9 % injection 3 mL  3 mL Intravenous PRN Teodoro Spray, MD        Physical Exam BP 123/75   Pulse 72   Resp 20   Ht 5\' 4"  (1.626 m)   Wt 128 lb (58.1 kg)   SpO2 96% Comment: RA  BMI 21.46 kg/m  76 year old woman in no acute distress Alert and oriented 3 with no focal deficits Sternal incision clean dry and intact, sternum stable Cardiac regular rate and rhythm normal S1 and S2 Lungs clear with breast bilaterally No peripheral edema, leg incisions healing well  Diagnostic Tests: CHEST  2 VIEW  COMPARISON:  Chest x-ray 01/03/2016.  FINDINGS: Compared to the prior examination, previously noted pleural effusions have nearly completely resolved and are now very small.  No consolidative airspace disease. No evidence of pulmonary edema. Heart size is normal. Upper mediastinal contours are within normal limits. Aortic atherosclerosis. Status post median sternotomy for CABG, including LIMA. Surgical clips project over the right upper quadrant of the abdomen, compatible with prior cholecystectomy.  IMPRESSION: 1. Decreasing nearly completely resolved small bilateral pleural effusions. 2. Aortic atherosclerosis. 3. Postoperative changes, as above.   Electronically Signed   By: Vinnie Langton M.D.   On: 02/02/2016 12:26  Impression: Sheena Rice is a 76 year old woman who had coronary bypass grafting on 12/31/2015. She has done extremely well postoperatively. She's having minimal discomfort and her exercise tolerance is excellent.  I advised her not to lift anything over 10 pounds for another 2 weeks. After that she can gradually begin to build up.  I think she is fine to start cardiac rehabilitation. She is going to do that at [facility] [employer].  She may drive. Appropriate precautions were discussed  Plan: Follow-up with Dr. Ubaldo Glassing  I will be happy to see her again at any time if I can be of any further assistance with her care.  [name], MD [facility] [phone]

## 2016-02-10 DIAGNOSIS — H524 Presbyopia: Secondary | ICD-10-CM | POA: Diagnosis not present

## 2016-02-15 ENCOUNTER — Encounter: Payer: Commercial Managed Care - HMO | Attending: Cardiology | Admitting: *Deleted

## 2016-02-15 VITALS — Ht 65.4 in | Wt 131.2 lb

## 2016-02-15 DIAGNOSIS — Z951 Presence of aortocoronary bypass graft: Secondary | ICD-10-CM | POA: Insufficient documentation

## 2016-02-15 NOTE — Patient Instructions (Addendum)
Patient Instructions  Patient Details  Name: Sheena Rice MRN: [id number] Date of Birth: 1940-06-23 Referring Provider:  Teodoro Spray, MD  Below are the personal goals you chose as well as exercise and nutrition goals. Our goal is to help you keep on track towards obtaining and maintaining your goals. We will be discussing your progress on these goals with you throughout the program.  Initial Exercise Prescription:     Initial Exercise Prescription - 02/15/16 1500      Date of Initial Exercise RX and Referring Provider   Date 02/15/16   Referring Provider Bartholome Bill MD     Treadmill   MPH 2.3   Grade 0   Minutes 15   METs 2.76     Bike   Level 0.5   Minutes 15   METs 2     NuStep   Level 2   Minutes 15   METs 2     Biostep-RELP   Level 2   Minutes 15   METs 2     Prescription Details   Frequency (times per week) 2   Duration Progress to 45 minutes of aerobic exercise without signs/symptoms of physical distress     Intensity   THRR 40-80% of Max Heartrate 97-130   Ratings of Perceived Exertion 11-15   Perceived Dyspnea 0-4     Progression   Progression Continue to progress workloads to maintain intensity without signs/symptoms of physical distress.     Resistance Training   Training Prescription Yes   Weight 2 lbs   Reps 10-15      Exercise Goals: Frequency: Be able to perform aerobic exercise three times per week working toward 3-5 days per week.  Intensity: Work with a perceived exertion of 11 (fairly light) - 15 (hard) as tolerated. Follow your new exercise prescription and watch for changes in prescription as you progress with the program. Changes will be reviewed with you when they are made.  Duration: You should be able to do 30 minutes of continuous aerobic exercise in addition to a 5 minute warm-up and a 5 minute cool-down routine.  Nutrition Goals: Your personal nutrition goals will be established when you do your nutrition analysis  with the dietician.  The following are nutrition guidelines to follow: Cholesterol < 200mg /day Sodium < 1500mg /day Fiber: Women over 50 yrs - 21 grams per day  Personal Goals:     Personal Goals and Risk Factors at Admission - 02/15/16 1041      Core Components/Risk Factors/Patient Goals on Admission    Weight Management Yes;Weight Maintenance   Intervention Weight Management: Develop a combined nutrition and exercise program designed to reach desired caloric intake, while maintaining appropriate intake of nutrient and fiber, sodium and fats, and appropriate energy expenditure required for the weight goal.;Weight Management: Provide education and appropriate resources to help participant work on and attain dietary goals.   Admit Weight 131 lb 3.2 oz (59.5 kg)   Expected Outcomes Weight Maintenance: Understanding of the daily nutrition guidelines, which includes 25-35% calories from fat, 7% or less cal from saturated fats, less than 200mg  cholesterol, less than 1.5gm of sodium, & 5 or more servings of fruits and vegetables daily   Sedentary Yes   Intervention Provide advice, education, support and counseling about physical activity/exercise needs.;Develop an individualized exercise prescription for aerobic and resistive training based on initial evaluation findings, risk stratification, comorbidities and participant's personal goals.   Expected Outcomes Achievement of increased cardiorespiratory fitness and enhanced flexibility,  muscular endurance and strength shown through measurements of functional capacity and personal statement of participant.   Increase Strength and Stamina Yes   Intervention Provide advice, education, support and counseling about physical activity/exercise needs.;Develop an individualized exercise prescription for aerobic and resistive training based on initial evaluation findings, risk stratification, comorbidities and participant's personal goals.   Expected Outcomes  Achievement of increased cardiorespiratory fitness and enhanced flexibility, muscular endurance and strength shown through measurements of functional capacity and personal statement of participant.   Hypertension Yes   Intervention Provide education on lifestyle modifcations including regular physical activity/exercise, weight management, moderate sodium restriction and increased consumption of fresh fruit, vegetables, and low fat dairy, alcohol moderation, and smoking cessation.;Monitor prescription use compliance.   Expected Outcomes Short Term: Continued assessment and intervention until BP is < 140/49mm HG in hypertensive participants. < 130/56mm HG in hypertensive participants with diabetes, heart failure or chronic kidney disease.;Long Term: Maintenance of blood pressure at goal levels.   Lipids Yes   Intervention Provide education and support for participant on nutrition & aerobic/resistive exercise along with prescribed medications to achieve LDL 70mg , HDL >40mg .   Expected Outcomes Short Term: Participant states understanding of desired cholesterol values and is compliant with medications prescribed. Participant is following exercise prescription and nutrition guidelines.;Long Term: Cholesterol controlled with medications as prescribed, with individualized exercise RX and with personalized nutrition plan. Value goals: LDL < 70mg , HDL > 40 mg.      Tobacco Use Initial Evaluation: History  Smoking Status  . Never Smoker  Smokeless Tobacco  . Never Used    Copy of goals given to participant.

## 2016-02-15 NOTE — Progress Notes (Signed)
Cardiac Individual Treatment Plan  Patient Details  Name: Sheena Rice MRN: [id number] Date of Birth: [birth year] Referring Provider:   Flowsheet Row Cardiac Rehab from 02/15/2016 in [facility] Cardiac and Pulmonary Rehab  Referring Provider  Bartholome Bill MD      Initial Encounter Date:  Flowsheet Row Cardiac Rehab from 02/15/2016 in [facility] Cardiac and Pulmonary Rehab  Date  02/15/16  Referring Provider  Bartholome Bill MD     Visit Diagnosis: S/P CABG x 4  Patient's Home Medications on Admission:  Current Outpatient Prescriptions:  .  acetaminophen (TYLENOL) 325 MG tablet, Take 2 tablets (650 mg total) by mouth every 6 (six) hours as needed for mild pain or headache. (Patient taking differently: Take 500 mg by mouth every 6 (six) hours as needed for mild pain or headache. ), Disp: , Rfl:  .  aspirin EC 81 MG EC tablet, Take 1 tablet (81 mg total) by mouth daily., Disp: 30 tablet, Rfl: 0 .  Cholecalciferol (VITAMIN D3) 5000 units CAPS, Take 5,000 Units by mouth daily. , Disp: , Rfl:  .  colestipol (COLESTID) 1 g tablet, Take 2 tablets by mouth 2 (two) times daily., Disp: , Rfl:  .  cyclobenzaprine (FLEXERIL) 10 MG tablet, Take 10 mg by mouth at bedtime. , Disp: , Rfl:  .  esomeprazole (NEXIUM) 20 MG capsule, Take 20 mg by mouth every morning. , Disp: , Rfl:  .  Flaxseed, Linseed, (FLAX SEED OIL PO), Take 1,400 mg by mouth daily., Disp: , Rfl:  .  gemfibrozil (LOPID) 600 MG tablet, Take 600 mg by mouth 2 (two) times daily. , Disp: , Rfl:  .  guaiFENesin (MUCINEX) 600 MG 12 hr tablet, Take 1 tablet (600 mg total) by mouth every 12 (twelve) hours as needed for cough., Disp: , Rfl:  .  Magnesium Oxide 400 MG CAPS, Take 400 mg by mouth 2 (two) times daily. , Disp: , Rfl:  .  metoprolol tartrate (LOPRESSOR) 25 MG tablet, Take 1 tablet (25 mg total) by mouth 2 (two) times daily., Disp: 30 tablet, Rfl: 3 .  Neomycin-Bacitracin-Polymyxin (TRIPLE ANTIBIOTIC EX), Apply 1 application topically as needed.,  Disp: , Rfl:  .  Probiotic Product (PROBIOTIC DAILY) CAPS, Take 1 capsule by mouth daily., Disp: , Rfl:  No current facility-administered medications for this visit.   Facility-Administered Medications Ordered in Other Visits:  .  0.9 %  sodium chloride infusion, 250 mL, Intravenous, PRN, Teodoro Spray, MD .  [EXPIRED] 0.9% sodium chloride infusion, 3 mL/kg/hr, Intravenous, Continuous **FOLLOWED BY** 0.9% sodium chloride infusion, 1 mL/kg/hr, Intravenous, Continuous, Teodoro Spray, MD .  sodium chloride flush (NS) 0.9 % injection 3 mL, 3 mL, Intravenous, Q12H, Teodoro Spray, MD .  sodium chloride flush (NS) 0.9 % injection 3 mL, 3 mL, Intravenous, PRN, Teodoro Spray, MD  Past Medical History: Past Medical History:  Diagnosis Date  . Arthritis    OA  . Chronic fatigue   . Coronary artery disease   . GERD (gastroesophageal reflux disease)   . Hypertension   . Unstable angina (HCC)     Tobacco Use: History  Smoking Status  . Never Smoker  Smokeless Tobacco  . Never Used    Labs: Recent Review Flowsheet Data    Labs for ITP Cardiac and Pulmonary Rehab Latest Ref Rng & Units 12/31/2015 12/31/2015 12/31/2015 12/31/2015 01/01/2016   Hemoglobin A1c 4.8 - 5.6 % - - - - -   PHART 7.350 - 7.450 7.290(L) 7.325(L) -  7.306(L) -   PCO2ART 32.0 - 48.0 mmHg 50.8(H) 42.5 - 41.8 -   HCO3 20.0 - 28.0 mmol/L 24.6 22.3 - 21.0 -   TCO2 0 - 100 mmol/L 26 24 22 22 21    ACIDBASEDEF 0.0 - 2.0 mmol/L 3.0(H) 4.0(H) - 5.0(H) -   O2SAT % 95.0 97.0 - 97.0 -       Exercise Target Goals: Date: 02/15/16  Exercise Program Goal: Individual exercise prescription set with THRR, safety & activity barriers. Participant demonstrates ability to understand and report RPE using BORG scale, to self-measure pulse accurately, and to acknowledge the importance of the exercise prescription.  Exercise Prescription Goal: Starting with aerobic activity 30 plus minutes a day, 3 days per week for initial exercise  prescription. Provide home exercise prescription and guidelines that participant acknowledges understanding prior to discharge.  Activity Barriers & Risk Stratification:     Activity Barriers & Cardiac Risk Stratification - 02/15/16 1236      Activity Barriers & Cardiac Risk Stratification   Activity Barriers Deconditioning;Muscular Weakness;Incisional Pain;Other (comment)   Comments Chronic Fatigue Syndrome but she crossed out fibromyalgia   Cardiac Risk Stratification High      6 Minute Walk:     6 Minute Walk    Row Name 02/15/16 1526         6 Minute Walk   Phase Initial     Distance 1260 feet     Walk Time 6 minutes     # of Rest Breaks 0     MPH 2.39     METS 2.77     RPE 13     VO2 Peak 1260     Symptoms No     Resting HR 64 bpm     Resting BP 138/62     Max Ex. HR 88 bpm     Max Ex. BP 128/60     2 Minute Post BP 122/60        Initial Exercise Prescription:     Initial Exercise Prescription - 02/15/16 1500      Date of Initial Exercise RX and Referring Provider   Date 02/15/16   Referring Provider Bartholome Bill MD     Treadmill   MPH 2.3   Grade 0   Minutes 15   METs 2.76     Bike   Level 0.5   Minutes 15   METs 2     NuStep   Level 2   Minutes 15   METs 2     Biostep-RELP   Level 2   Minutes 15   METs 2     Prescription Details   Frequency (times per week) 2   Duration Progress to 45 minutes of aerobic exercise without signs/symptoms of physical distress     Intensity   THRR 40-80% of Max Heartrate 97-130   Ratings of Perceived Exertion 11-15   Perceived Dyspnea 0-4     Progression   Progression Continue to progress workloads to maintain intensity without signs/symptoms of physical distress.     Resistance Training   Training Prescription Yes   Weight 2 lbs   Reps 10-15      Perform Capillary Blood Glucose checks as needed.  Exercise Prescription Changes:      Exercise Prescription Changes    Row Name 02/15/16  1500             Exercise Review   Progression -  walk test results  Response to Exercise   Blood Pressure (Admit) 138/62       Blood Pressure (Exercise) 128/60       Blood Pressure (Exit) 122/60       Heart Rate (Admit) 64 bpm       Heart Rate (Exercise) 88 bpm       Heart Rate (Exit) 70 bpm       Oxygen Saturation (Admit) 93 %       Oxygen Saturation (Exercise) 100 %       Rating of Perceived Exertion (Exercise) 13       Symptoms none          Exercise Comments:   Discharge Exercise Prescription (Final Exercise Prescription Changes):     Exercise Prescription Changes - 02/15/16 1500      Exercise Review   Progression --  walk test results     Response to Exercise   Blood Pressure (Admit) 138/62   Blood Pressure (Exercise) 128/60   Blood Pressure (Exit) 122/60   Heart Rate (Admit) 64 bpm   Heart Rate (Exercise) 88 bpm   Heart Rate (Exit) 70 bpm   Oxygen Saturation (Admit) 93 %   Oxygen Saturation (Exercise) 100 %   Rating of Perceived Exertion (Exercise) 13   Symptoms none      Nutrition:  Target Goals: Understanding of nutrition guidelines, daily intake of sodium 1500mg , cholesterol 200mg , calories 30% from fat and 7% or less from saturated fats, daily to have 5 or more servings of fruits and vegetables.  Biometrics:     Pre Biometrics - 02/15/16 1531      Pre Biometrics   Height 5' 5.4" (1.661 m)   Weight 131 lb 3.2 oz (59.5 kg)   Waist Circumference 29 inches   Hip Circumference 36 inches   Waist to Hip Ratio 0.81 %   BMI (Calculated) 21.6   Single Leg Stand 30 seconds       Nutrition Therapy Plan and Nutrition Goals:     Nutrition Therapy & Goals - 02/15/16 1241      Nutrition Therapy   Drug/Food Interactions Statins/Certain Fruits     Personal Nutrition Goals   Comments Appt made for [place] with the [employer].      Intervention Plan   Intervention Prescribe, educate and counsel regarding  individualized specific dietary modifications aiming towards targeted core components such as weight, hypertension, lipid management, diabetes, heart failure and other comorbidities.;Nutrition handout(s) given to patient.   Expected Outcomes Short Term Goal: Understand basic principles of dietary content, such as calories, fat, sodium, cholesterol and nutrients.;Short Term Goal: A plan has been developed with personal nutrition goals set during dietitian appointment.      Nutrition Discharge: Rate Your Plate Scores:   Nutrition Goals Re-Evaluation:   Psychosocial: Target Goals: Acknowledge presence or absence of depression, maximize coping skills, provide positive support system. Participant is able to verbalize types and ability to use techniques and skills needed for reducing stress and depression.  Initial Review & Psychosocial Screening:     Initial Psych Review & Screening - 02/15/16 [place]? Yes   Comments History Chronic Fatigue Syndrome since 1991. Flare ups occur     Screening Interventions   Interventions Encouraged to exercise      Quality of Life Scores:     Quality of Life - 02/15/16 1042      Quality of Life Scores  Health/Function Pre 25.67 %   Socioeconomic Pre 28.75 %   Psych/Spiritual Pre 30 %   Family Pre 28.5 %   GLOBAL Pre 27.58 %      PHQ-9: Recent Review Flowsheet Data    Depression screen [facility] 2/9 02/15/2016   Decreased Interest 0   Down, Depressed, Hopeless 0   PHQ - 2 Score 0   Altered sleeping 0   Tired, decreased energy 1    Change in appetite 0   Feeling bad or failure about yourself  0   Trouble concentrating 0   Moving slowly or fidgety/restless 0   Suicidal thoughts 0   PHQ-9 Score 1   Difficult doing work/chores Not difficult at all      Psychosocial Evaluation and Intervention:   Psychosocial Re-Evaluation:   Vocational Rehabilitation: Provide vocational rehab assistance to  qualifying candidates.   Vocational Rehab Evaluation & Intervention:     Vocational Rehab - 02/15/16 1041      Initial Vocational Rehab Evaluation & Intervention   Assessment shows need for Vocational Rehabilitation No      Education: Education Goals: Education classes will be provided on a weekly basis, covering required topics. Participant will state understanding/return demonstration of topics presented.  Learning Barriers/Preferences:     Learning Barriers/Preferences - 02/15/16 1041      Learning Barriers/Preferences   Learning Barriers Hearing  wears hearing aides   Learning Preferences Pictoral;Individual Instruction      Education Topics: General Nutrition Guidelines/Fats and Fiber: -Group instruction provided by verbal, written material, models and posters to present the general guidelines for heart healthy nutrition. Gives an explanation and review of dietary fats and fiber.   Controlling Sodium/Reading Food Labels: -Group verbal and written material supporting the discussion of sodium use in heart healthy nutrition. Review and explanation with models, verbal and written materials for utilization of the food label.   Exercise Physiology & Risk Factors: - Group verbal and written instruction with models to review the exercise physiology of the cardiovascular system and associated critical values. Details cardiovascular disease risk factors and the goals associated with each risk factor.   Aerobic Exercise & Resistance Training: - Gives group verbal and written discussion on the health impact of inactivity. On the components of aerobic and resistive training programs and the benefits of this training and how to safely progress through these programs.   Flexibility, Balance, General Exercise Guidelines: - Provides group verbal and written instruction on the benefits of flexibility and balance training programs. Provides general exercise guidelines with specific  guidelines to those with heart or lung disease. Demonstration and skill practice provided.   Stress Management: - Provides group verbal and written instruction about the health risks of elevated stress, cause of high stress, and healthy ways to reduce stress.   Depression: - Provides group verbal and written instruction on the correlation between heart/lung disease and depressed mood, treatment options, and the stigmas associated with seeking treatment.   Anatomy & Physiology of the Heart: - Group verbal and written instruction and models provide basic cardiac anatomy and physiology, with the coronary electrical and arterial systems. Review of: AMI, Angina, Valve disease, Heart Failure, Cardiac Arrhythmia, Pacemakers, and the ICD.   Cardiac Procedures: - Group verbal and written instruction and models to describe the testing methods done to diagnose heart disease. Reviews the outcomes of the test results. Describes the treatment choices: Medical Management, Angioplasty, or Coronary Bypass Surgery.   Cardiac Medications: - Group verbal and written instruction to  review commonly prescribed medications for heart disease. Reviews the medication, class of the drug, and side effects. Includes the steps to properly store meds and maintain the prescription regimen.   Go Sex-Intimacy & Heart Disease, Get SMART - Goal Setting: - Group verbal and written instruction through game format to discuss heart disease and the return to sexual intimacy. Provides group verbal and written material to discuss and apply goal setting through the application of the S.M.A.R.T. Method.   Other Matters of the Heart: - Provides group verbal, written materials and models to describe Heart Failure, Angina, Valve Disease, and Diabetes in the realm of heart disease. Includes description of the disease process and treatment options available to the cardiac patient.   Exercise & Equipment Safety: - Individual verbal  instruction and demonstration of equipment use and safety with use of the equipment. Flowsheet Row Cardiac Rehab from 02/15/2016 in [facility] Cardiac and Pulmonary Rehab  Date  02/15/16  Educator  Loletha Grayer ENterkinRN  Instruction Review Code  1- partially meets, needs review/practice      Infection Prevention: - Provides verbal and written material to individual with discussion of infection control including proper hand washing and proper equipment cleaning during exercise session. Flowsheet Row Cardiac Rehab from 02/15/2016 in [facility] Cardiac and Pulmonary Rehab  Date  02/15/16  Educator  C. EnterkinRN  Instruction Review Code  2- meets goals/outcomes      Falls Prevention: - Provides verbal and written material to individual with discussion of falls prevention and safety. Flowsheet Row Cardiac Rehab from 02/15/2016 in [facility] Cardiac and Pulmonary Rehab  Date  02/15/16  Educator  C. [place]  Instruction Review Code  2- meets goals/outcomes      Diabetes: - Individual verbal and written instruction to review signs/symptoms of diabetes, desired ranges of glucose level fasting, after meals and with exercise. Advice that pre and post exercise glucose checks will be done for 3 sessions at entry of program. Flowsheet Row Cardiac Rehab from 02/15/2016 in [facility] Cardiac and Pulmonary Rehab  Date  02/15/16       Knowledge Questionnaire Score:     Knowledge Questionnaire Score - 02/15/16 1239      Knowledge Questionnaire Score   Pre Score 27/28      Core Components/Risk Factors/Patient Goals at Admission:     Personal Goals and Risk Factors at Admission - 02/15/16 1041      Core Components/Risk Factors/Patient Goals on Admission    Weight Management Yes;Weight Maintenance   Intervention Weight Management: Develop a combined nutrition and exercise program designed to reach desired caloric intake, while maintaining appropriate intake of nutrient and fiber, sodium and fats, and appropriate energy  expenditure required for the weight goal.;Weight Management: Provide education and appropriate resources to help participant work on and attain dietary goals.   Admit Weight 131 lb 3.2 oz (59.5 kg)   Expected Outcomes Weight Maintenance: Understanding of the daily nutrition guidelines, which includes 25-35% calories from fat, 7% or less cal from saturated fats, less than 200mg  cholesterol, less than 1.5gm of sodium, & 5 or more servings of fruits and vegetables daily   Sedentary Yes   Intervention Provide advice, education, support and counseling about physical activity/exercise needs.;Develop an individualized exercise prescription for aerobic and resistive training based on initial evaluation findings, risk stratification, comorbidities and participant's personal goals.   Expected Outcomes Achievement of increased cardiorespiratory fitness and enhanced flexibility, muscular endurance and strength shown through measurements of functional capacity and personal statement of participant.  Increase Strength and Stamina Yes   Intervention Provide advice, education, support and counseling about physical activity/exercise needs.;Develop an individualized exercise prescription for aerobic and resistive training based on initial evaluation findings, risk stratification, comorbidities and participant's personal goals.   Expected Outcomes Achievement of increased cardiorespiratory fitness and enhanced flexibility, muscular endurance and strength shown through measurements of functional capacity and personal statement of participant.   Hypertension Yes   Intervention Provide education on lifestyle modifcations including regular physical activity/exercise, weight management, moderate sodium restriction and increased consumption of fresh fruit, vegetables, and low fat dairy, alcohol moderation, and smoking cessation.;Monitor prescription use compliance.   Expected Outcomes Short Term: Continued assessment and  intervention until BP is < 140/18mm HG in hypertensive participants. < 130/2mm HG in hypertensive participants with diabetes, heart failure or chronic kidney disease.;Long Term: Maintenance of blood pressure at goal levels.   Lipids Yes   Intervention Provide education and support for participant on nutrition & aerobic/resistive exercise along with prescribed medications to achieve LDL 70mg , HDL >40mg .   Expected Outcomes Short Term: Participant states understanding of desired cholesterol values and is compliant with medications prescribed. Participant is following exercise prescription and nutrition guidelines.;Long Term: Cholesterol controlled with medications as prescribed, with individualized exercise RX and with personalized nutrition plan. Value goals: LDL < 70mg , HDL > 40 mg.      Core Components/Risk Factors/Patient Goals Review:    Core Components/Risk Factors/Patient Goals at Discharge (Final Review):    ITP Comments:     ITP Comments    Row Name 02/15/16 1043 02/15/16 1237 02/15/16 1242       ITP Comments ITP created during Medical Review/Orientation appt. Documentation of DX CHL/EPIC Encounter dated 12/30/2015 Chronic Fatigue Syndrome but she crossed out fibromyalgi Appt made for [place] with the [employer].        Comments: Initial ITP

## 2016-02-15 NOTE — Progress Notes (Signed)
Daily Session Note  Patient Details  Name: Sheena Rice MRN: [id number] Date of Birth: Apr 19, 1940 Referring Provider:    Encounter Date: 02/15/2016  Check In:      Goals Met:  Personal goals reviewed  Goals Unmet:  Not Applicable  Comments:     Dr. Emily Filbert is Medical Director for [place] and [facility].

## 2016-02-16 NOTE — Progress Notes (Signed)
Cardiac Individual Treatment Plan  Patient Details  Name: Sheena Rice MRN: [id number] Date of Birth: [birth year] Referring Provider:   Flowsheet Row Cardiac Rehab from 02/15/2016 in [facility] Cardiac and Pulmonary Rehab  Referring Provider  Bartholome Bill MD      Initial Encounter Date:  Flowsheet Row Cardiac Rehab from 02/15/2016 in [facility] Cardiac and Pulmonary Rehab  Date  02/15/16  Referring Provider  Bartholome Bill MD      Visit Diagnosis: S/P CABG x 4  Patient's Home Medications on Admission:  Current Outpatient Prescriptions:  .  acetaminophen (TYLENOL) 325 MG tablet, Take 2 tablets (650 mg total) by mouth every 6 (six) hours as needed for mild pain or headache. (Patient taking differently: Take 500 mg by mouth every 6 (six) hours as needed for mild pain or headache. ), Disp: , Rfl:  .  aspirin EC 81 MG EC tablet, Take 1 tablet (81 mg total) by mouth daily., Disp: 30 tablet, Rfl: 0 .  Cholecalciferol (VITAMIN D3) 5000 units CAPS, Take 5,000 Units by mouth daily. , Disp: , Rfl:  .  colestipol (COLESTID) 1 g tablet, Take 2 tablets by mouth 2 (two) times daily., Disp: , Rfl:  .  cyclobenzaprine (FLEXERIL) 10 MG tablet, Take 10 mg by mouth at bedtime. , Disp: , Rfl:  .  esomeprazole (NEXIUM) 20 MG capsule, Take 20 mg by mouth every morning. , Disp: , Rfl:  .  Flaxseed, Linseed, (FLAX SEED OIL PO), Take 1,400 mg by mouth daily., Disp: , Rfl:  .  gemfibrozil (LOPID) 600 MG tablet, Take 600 mg by mouth 2 (two) times daily. , Disp: , Rfl:  .  guaiFENesin (MUCINEX) 600 MG 12 hr tablet, Take 1 tablet (600 mg total) by mouth every 12 (twelve) hours as needed for cough., Disp: , Rfl:  .  Magnesium Oxide 400 MG CAPS, Take 400 mg by mouth 2 (two) times daily. , Disp: , Rfl:  .  metoprolol tartrate (LOPRESSOR) 25 MG tablet, Take 1 tablet (25 mg total) by mouth 2 (two) times daily., Disp: 30 tablet, Rfl: 3 .  Neomycin-Bacitracin-Polymyxin (TRIPLE ANTIBIOTIC EX), Apply 1 application topically as  needed., Disp: , Rfl:  .  Probiotic Product (PROBIOTIC DAILY) CAPS, Take 1 capsule by mouth daily., Disp: , Rfl:  No current facility-administered medications for this visit.   Facility-Administered Medications Ordered in Other Visits:  .  0.9 %  sodium chloride infusion, 250 mL, Intravenous, PRN, Teodoro Spray, MD .  [EXPIRED] 0.9% sodium chloride infusion, 3 mL/kg/hr, Intravenous, Continuous **FOLLOWED BY** 0.9% sodium chloride infusion, 1 mL/kg/hr, Intravenous, Continuous, Teodoro Spray, MD .  sodium chloride flush (NS) 0.9 % injection 3 mL, 3 mL, Intravenous, Q12H, Teodoro Spray, MD .  sodium chloride flush (NS) 0.9 % injection 3 mL, 3 mL, Intravenous, PRN, Teodoro Spray, MD  Past Medical History: Past Medical History:  Diagnosis Date  . Arthritis    OA  . Chronic fatigue   . Coronary artery disease   . GERD (gastroesophageal reflux disease)   . Hypertension   . Unstable angina (HCC)     Tobacco Use: History  Smoking Status  . Never Smoker  Smokeless Tobacco  . Never Used    Labs: Recent Review Flowsheet Data    Labs for ITP Cardiac and Pulmonary Rehab Latest Ref Rng & Units 12/31/2015 12/31/2015 12/31/2015 12/31/2015 01/01/2016   Hemoglobin A1c 4.8 - 5.6 % - - - - -   PHART 7.350 - 7.450 7.290(L)  7.325(L) - 7.306(L) -   PCO2ART 32.0 - 48.0 mmHg 50.8(H) 42.5 - 41.8 -   HCO3 20.0 - 28.0 mmol/L 24.6 22.3 - 21.0 -   TCO2 0 - 100 mmol/L 26 24 22 22 21    ACIDBASEDEF 0.0 - 2.0 mmol/L 3.0(H) 4.0(H) - 5.0(H) -   O2SAT % 95.0 97.0 - 97.0 -       Exercise Target Goals: Date: 02/15/16  Exercise Program Goal: Individual exercise prescription set with THRR, safety & activity barriers. Participant demonstrates ability to understand and report RPE using BORG scale, to self-measure pulse accurately, and to acknowledge the importance of the exercise prescription.  Exercise Prescription Goal: Starting with aerobic activity 30 plus minutes a day, 3 days per week for initial  exercise prescription. Provide home exercise prescription and guidelines that participant acknowledges understanding prior to discharge.  Activity Barriers & Risk Stratification:     Activity Barriers & Cardiac Risk Stratification - 02/15/16 1236      Activity Barriers & Cardiac Risk Stratification   Activity Barriers Deconditioning;Muscular Weakness;Incisional Pain;Other (comment)   Comments Chronic Fatigue Syndrome but she crossed out fibromyalgia   Cardiac Risk Stratification High      6 Minute Walk:     6 Minute Walk    Row Name 02/15/16 1526         6 Minute Walk   Phase Initial     Distance 1260 feet     Walk Time 6 minutes     # of Rest Breaks 0     MPH 2.39     METS 2.77     RPE 13     VO2 Peak 1260     Symptoms No     Resting HR 64 bpm     Resting BP 138/62     Max Ex. HR 88 bpm     Max Ex. BP 128/60     2 Minute Post BP 122/60        Initial Exercise Prescription:     Initial Exercise Prescription - 02/15/16 1500      Date of Initial Exercise RX and Referring Provider   Date 02/15/16   Referring Provider Bartholome Bill MD     Treadmill   MPH 2.3   Grade 0   Minutes 15   METs 2.76     Bike   Level 0.5   Minutes 15   METs 2     NuStep   Level 2   Minutes 15   METs 2     Biostep-RELP   Level 2   Minutes 15   METs 2     Prescription Details   Frequency (times per week) 2   Duration Progress to 45 minutes of aerobic exercise without signs/symptoms of physical distress     Intensity   THRR 40-80% of Max Heartrate 97-130   Ratings of Perceived Exertion 11-15   Perceived Dyspnea 0-4     Progression   Progression Continue to progress workloads to maintain intensity without signs/symptoms of physical distress.     Resistance Training   Training Prescription Yes   Weight 2 lbs   Reps 10-15      Perform Capillary Blood Glucose checks as needed.  Exercise Prescription Changes:     Exercise Prescription Changes    Row Name  02/15/16 1500             Exercise Review   Progression -  walk test results  Response to Exercise   Blood Pressure (Admit) 138/62       Blood Pressure (Exercise) 128/60       Blood Pressure (Exit) 122/60       Heart Rate (Admit) 64 bpm       Heart Rate (Exercise) 88 bpm       Heart Rate (Exit) 70 bpm       Oxygen Saturation (Admit) 93 %       Oxygen Saturation (Exercise) 100 %       Rating of Perceived Exertion (Exercise) 13       Symptoms none          Exercise Comments:   Discharge Exercise Prescription (Final Exercise Prescription Changes):     Exercise Prescription Changes - 02/15/16 1500      Exercise Review   Progression --  walk test results     Response to Exercise   Blood Pressure (Admit) 138/62   Blood Pressure (Exercise) 128/60   Blood Pressure (Exit) 122/60   Heart Rate (Admit) 64 bpm   Heart Rate (Exercise) 88 bpm   Heart Rate (Exit) 70 bpm   Oxygen Saturation (Admit) 93 %   Oxygen Saturation (Exercise) 100 %   Rating of Perceived Exertion (Exercise) 13   Symptoms none      Nutrition:  Target Goals: Understanding of nutrition guidelines, daily intake of sodium 1500mg , cholesterol 200mg , calories 30% from fat and 7% or less from saturated fats, daily to have 5 or more servings of fruits and vegetables.  Biometrics:     Pre Biometrics - 02/15/16 1531      Pre Biometrics   Height 5' 5.4" (1.661 m)   Weight 131 lb 3.2 oz (59.5 kg)   Waist Circumference 29 inches   Hip Circumference 36 inches   Waist to Hip Ratio 0.81 %   BMI (Calculated) 21.6   Single Leg Stand 30 seconds       Nutrition Therapy Plan and Nutrition Goals:     Nutrition Therapy & Goals - 02/15/16 1241      Nutrition Therapy   Drug/Food Interactions Statins/Certain Fruits     Personal Nutrition Goals   Comments Appt made for Marquia with the [employer].      Intervention Plan   Intervention Prescribe, educate and counsel regarding  individualized specific dietary modifications aiming towards targeted core components such as weight, hypertension, lipid management, diabetes, heart failure and other comorbidities.;Nutrition handout(s) given to patient.   Expected Outcomes Short Term Goal: Understand basic principles of dietary content, such as calories, fat, sodium, cholesterol and nutrients.;Short Term Goal: A plan has been developed with personal nutrition goals set during dietitian appointment.      Nutrition Discharge: Rate Your Plate Scores:     Nutrition Assessments - 02/16/16 1131      MEDFICTS Scores   Pre Score 40      Nutrition Goals Re-Evaluation:   Psychosocial: Target Goals: Acknowledge presence or absence of depression, maximize coping skills, provide positive support system. Participant is able to verbalize types and ability to use techniques and skills needed for reducing stress and depression.  Initial Review & Psychosocial Screening:     Initial Psych Review & Screening - 02/15/16 [place]? Yes   Comments History Chronic Fatigue Syndrome since 1991. Flare ups occur     Screening Interventions   Interventions Encouraged to exercise      Quality  of Life Scores:     Quality of Life - 02/15/16 1042      Quality of Life Scores   Health/Function Pre 25.67 %   Socioeconomic Pre 28.75 %   Psych/Spiritual Pre 30 %   Family Pre 28.5 %   GLOBAL Pre 27.58 %      PHQ-9: Recent Review Flowsheet Data    Depression screen [facility] 2/9 02/15/2016   Decreased Interest 0   Down, Depressed, Hopeless 0   PHQ - 2 Score 0   Altered sleeping 0   Tired, decreased energy 1    Change in appetite 0   Feeling bad or failure about yourself  0   Trouble concentrating 0   Moving slowly or fidgety/restless 0   Suicidal thoughts 0   PHQ-9 Score 1   Difficult doing work/chores Not difficult at all      Psychosocial Evaluation and Intervention:   Psychosocial  Re-Evaluation:   Vocational Rehabilitation: Provide vocational rehab assistance to qualifying candidates.   Vocational Rehab Evaluation & Intervention:     Vocational Rehab - 02/15/16 1041      Initial Vocational Rehab Evaluation & Intervention   Assessment shows need for Vocational Rehabilitation No      Education: Education Goals: Education classes will be provided on a weekly basis, covering required topics. Participant will state understanding/return demonstration of topics presented.  Learning Barriers/Preferences:     Learning Barriers/Preferences - 02/15/16 1041      Learning Barriers/Preferences   Learning Barriers Hearing  wears hearing aides   Learning Preferences Pictoral;Individual Instruction      Education Topics: General Nutrition Guidelines/Fats and Fiber: -Group instruction provided by verbal, written material, models and posters to present the general guidelines for heart healthy nutrition. Gives an explanation and review of dietary fats and fiber.   Controlling Sodium/Reading Food Labels: -Group verbal and written material supporting the discussion of sodium use in heart healthy nutrition. Review and explanation with models, verbal and written materials for utilization of the food label.   Exercise Physiology & Risk Factors: - Group verbal and written instruction with models to review the exercise physiology of the cardiovascular system and associated critical values. Details cardiovascular disease risk factors and the goals associated with each risk factor.   Aerobic Exercise & Resistance Training: - Gives group verbal and written discussion on the health impact of inactivity. On the components of aerobic and resistive training programs and the benefits of this training and how to safely progress through these programs.   Flexibility, Balance, General Exercise Guidelines: - Provides group verbal and written instruction on the benefits of  flexibility and balance training programs. Provides general exercise guidelines with specific guidelines to those with heart or lung disease. Demonstration and skill practice provided.   Stress Management: - Provides group verbal and written instruction about the health risks of elevated stress, cause of high stress, and healthy ways to reduce stress.   Depression: - Provides group verbal and written instruction on the correlation between heart/lung disease and depressed mood, treatment options, and the stigmas associated with seeking treatment.   Anatomy & Physiology of the Heart: - Group verbal and written instruction and models provide basic cardiac anatomy and physiology, with the coronary electrical and arterial systems. Review of: AMI, Angina, Valve disease, Heart Failure, Cardiac Arrhythmia, Pacemakers, and the ICD.   Cardiac Procedures: - Group verbal and written instruction and models to describe the testing methods done to diagnose heart disease. Reviews the outcomes of the  test results. Describes the treatment choices: Medical Management, Angioplasty, or Coronary Bypass Surgery.   Cardiac Medications: - Group verbal and written instruction to review commonly prescribed medications for heart disease. Reviews the medication, class of the drug, and side effects. Includes the steps to properly store meds and maintain the prescription regimen.   Go Sex-Intimacy & Heart Disease, Get SMART - Goal Setting: - Group verbal and written instruction through game format to discuss heart disease and the return to sexual intimacy. Provides group verbal and written material to discuss and apply goal setting through the application of the S.M.A.R.T. Method.   Other Matters of the Heart: - Provides group verbal, written materials and models to describe Heart Failure, Angina, Valve Disease, and Diabetes in the realm of heart disease. Includes description of the disease process and treatment  options available to the cardiac patient.   Exercise & Equipment Safety: - Individual verbal instruction and demonstration of equipment use and safety with use of the equipment. Flowsheet Row Cardiac Rehab from 02/15/2016 in [facility] Cardiac and Pulmonary Rehab  Date  02/15/16  Educator  Loletha Grayer ENterkinRN  Instruction Review Code  1- partially meets, needs review/practice      Infection Prevention: - Provides verbal and written material to individual with discussion of infection control including proper hand washing and proper equipment cleaning during exercise session. Flowsheet Row Cardiac Rehab from 02/15/2016 in [facility] Cardiac and Pulmonary Rehab  Date  02/15/16  Educator  C. EnterkinRN  Instruction Review Code  2- meets goals/outcomes      Falls Prevention: - Provides verbal and written material to individual with discussion of falls prevention and safety. Flowsheet Row Cardiac Rehab from 02/15/2016 in [facility] Cardiac and Pulmonary Rehab  Date  02/15/16  Educator  C. [place]  Instruction Review Code  2- meets goals/outcomes      Diabetes: - Individual verbal and written instruction to review signs/symptoms of diabetes, desired ranges of glucose level fasting, after meals and with exercise. Advice that pre and post exercise glucose checks will be done for 3 sessions at entry of program. Flowsheet Row Cardiac Rehab from 02/15/2016 in [facility] [place] Cardiac and Pulmonary Rehab  Date  02/15/16       Knowledge Questionnaire Score:     Knowledge Questionnaire Score - 02/15/16 1239      Knowledge Questionnaire Score   Pre Score 27/28      Core Components/Risk Factors/Patient Goals at Admission:     Personal Goals and Risk Factors at Admission - 02/15/16 1041      Core Components/Risk Factors/Patient Goals on Admission    Weight Management Yes;Weight Maintenance   Intervention Weight Management: Develop a combined nutrition and exercise program designed to reach desired caloric intake,  while maintaining appropriate intake of nutrient and fiber, sodium and fats, and appropriate energy expenditure required for the weight goal.;Weight Management: Provide education and appropriate resources to help participant work on and attain dietary goals.   Admit Weight 131 lb 3.2 oz (59.5 kg)   Expected Outcomes Weight Maintenance: Understanding of the daily nutrition guidelines, which includes 25-35% calories from fat, 7% or less cal from saturated fats, less than 200mg  cholesterol, less than 1.5gm of sodium, & 5 or more servings of fruits and vegetables daily   Sedentary Yes   Intervention Provide advice, education, support and counseling about physical activity/exercise needs.;Develop an individualized exercise prescription for aerobic and resistive training based on initial evaluation findings, risk stratification, comorbidities and participant's personal goals.   Expected Outcomes Achievement  of increased cardiorespiratory fitness and enhanced flexibility, muscular endurance and strength shown through measurements of functional capacity and personal statement of participant.   Increase Strength and Stamina Yes   Intervention Provide advice, education, support and counseling about physical activity/exercise needs.;Develop an individualized exercise prescription for aerobic and resistive training based on initial evaluation findings, risk stratification, comorbidities and participant's personal goals.   Expected Outcomes Achievement of increased cardiorespiratory fitness and enhanced flexibility, muscular endurance and strength shown through measurements of functional capacity and personal statement of participant.   Hypertension Yes   Intervention Provide education on lifestyle modifcations including regular physical activity/exercise, weight management, moderate sodium restriction and increased consumption of fresh fruit, vegetables, and low fat dairy, alcohol moderation, and smoking  cessation.;Monitor prescription use compliance.   Expected Outcomes Short Term: Continued assessment and intervention until BP is < 140/83mm HG in hypertensive participants. < 130/77mm HG in hypertensive participants with diabetes, heart failure or chronic kidney disease.;Long Term: Maintenance of blood pressure at goal levels.   Lipids Yes   Intervention Provide education and support for participant on nutrition & aerobic/resistive exercise along with prescribed medications to achieve LDL 70mg , HDL >40mg .   Expected Outcomes Short Term: Participant states understanding of desired cholesterol values and is compliant with medications prescribed. Participant is following exercise prescription and nutrition guidelines.;Long Term: Cholesterol controlled with medications as prescribed, with individualized exercise RX and with personalized nutrition plan. Value goals: LDL < 70mg , HDL > 40 mg.      Core Components/Risk Factors/Patient Goals Review:    Core Components/Risk Factors/Patient Goals at Discharge (Final Review):    ITP Comments:     ITP Comments    Row Name 02/15/16 1043 02/15/16 1237 02/15/16 1242       ITP Comments ITP created during Medical Review/Orientation appt. Documentation of DX CHL/EPIC Encounter dated 12/30/2015 Chronic Fatigue Syndrome but she crossed out fibromyalgi Appt made for [place] with the [employer].        Comments:

## 2016-02-17 ENCOUNTER — Encounter: Payer: Self-pay | Admitting: *Deleted

## 2016-02-17 DIAGNOSIS — Z951 Presence of aortocoronary bypass graft: Secondary | ICD-10-CM

## 2016-02-17 NOTE — Progress Notes (Signed)
Cardiac Individual Treatment Plan  Patient Details  Name: Sheena Rice MRN: [id number] Date of Birth: [birth year] Referring Provider:   Flowsheet Row Cardiac Rehab from 02/15/2016 in [facility] Cardiac and Pulmonary Rehab  Referring Provider  Bartholome Bill MD      Initial Encounter Date:  Flowsheet Row Cardiac Rehab from 02/15/2016 in [facility] Cardiac and Pulmonary Rehab  Date  02/15/16  Referring Provider  Bartholome Bill MD      Visit Diagnosis: S/P CABG x 4  Patient's Home Medications on Admission:  Current Outpatient Prescriptions:  .  acetaminophen (TYLENOL) 325 MG tablet, Take 2 tablets (650 mg total) by mouth every 6 (six) hours as needed for mild pain or headache. (Patient taking differently: Take 500 mg by mouth every 6 (six) hours as needed for mild pain or headache. ), Disp: , Rfl:  .  aspirin EC 81 MG EC tablet, Take 1 tablet (81 mg total) by mouth daily., Disp: 30 tablet, Rfl: 0 .  Cholecalciferol (VITAMIN D3) 5000 units CAPS, Take 5,000 Units by mouth daily. , Disp: , Rfl:  .  colestipol (COLESTID) 1 g tablet, Take 2 tablets by mouth 2 (two) times daily., Disp: , Rfl:  .  cyclobenzaprine (FLEXERIL) 10 MG tablet, Take 10 mg by mouth at bedtime. , Disp: , Rfl:  .  esomeprazole (NEXIUM) 20 MG capsule, Take 20 mg by mouth every morning. , Disp: , Rfl:  .  Flaxseed, Linseed, (FLAX SEED OIL PO), Take 1,400 mg by mouth daily., Disp: , Rfl:  .  gemfibrozil (LOPID) 600 MG tablet, Take 600 mg by mouth 2 (two) times daily. , Disp: , Rfl:  .  guaiFENesin (MUCINEX) 600 MG 12 hr tablet, Take 1 tablet (600 mg total) by mouth every 12 (twelve) hours as needed for cough., Disp: , Rfl:  .  Magnesium Oxide 400 MG CAPS, Take 400 mg by mouth 2 (two) times daily. , Disp: , Rfl:  .  metoprolol tartrate (LOPRESSOR) 25 MG tablet, Take 1 tablet (25 mg total) by mouth 2 (two) times daily., Disp: 30 tablet, Rfl: 3 .  Neomycin-Bacitracin-Polymyxin (TRIPLE ANTIBIOTIC EX), Apply 1 application topically as  needed., Disp: , Rfl:  .  Probiotic Product (PROBIOTIC DAILY) CAPS, Take 1 capsule by mouth daily., Disp: , Rfl:  No current facility-administered medications for this visit.   Facility-Administered Medications Ordered in Other Visits:  .  0.9 %  sodium chloride infusion, 250 mL, Intravenous, PRN, Teodoro Spray, MD .  [EXPIRED] 0.9% sodium chloride infusion, 3 mL/kg/hr, Intravenous, Continuous **FOLLOWED BY** 0.9% sodium chloride infusion, 1 mL/kg/hr, Intravenous, Continuous, Teodoro Spray, MD .  sodium chloride flush (NS) 0.9 % injection 3 mL, 3 mL, Intravenous, Q12H, Teodoro Spray, MD .  sodium chloride flush (NS) 0.9 % injection 3 mL, 3 mL, Intravenous, PRN, Teodoro Spray, MD  Past Medical History: Past Medical History:  Diagnosis Date  . Arthritis    OA  . Chronic fatigue   . Coronary artery disease   . GERD (gastroesophageal reflux disease)   . Hypertension   . Unstable angina (HCC)     Tobacco Use: History  Smoking Status  . Never Smoker  Smokeless Tobacco  . Never Used    Labs: Recent Review Flowsheet Data    Labs for ITP Cardiac and Pulmonary Rehab Latest Ref Rng & Units 12/31/2015 12/31/2015 12/31/2015 12/31/2015 01/01/2016   Hemoglobin A1c 4.8 - 5.6 % - - - - -   PHART 7.350 - 7.450 7.290(L)  7.325(L) - 7.306(L) -   PCO2ART 32.0 - 48.0 mmHg 50.8(H) 42.5 - 41.8 -   HCO3 20.0 - 28.0 mmol/L 24.6 22.3 - 21.0 -   TCO2 0 - 100 mmol/L 26 24 22 22 21    ACIDBASEDEF 0.0 - 2.0 mmol/L 3.0(H) 4.0(H) - 5.0(H) -   O2SAT % 95.0 97.0 - 97.0 -       Exercise Target Goals:    Exercise Program Goal: Individual exercise prescription set with THRR, safety & activity barriers. Participant demonstrates ability to understand and report RPE using BORG scale, to self-measure pulse accurately, and to acknowledge the importance of the exercise prescription.  Exercise Prescription Goal: Starting with aerobic activity 30 plus minutes a day, 3 days per week for initial exercise  prescription. Provide home exercise prescription and guidelines that participant acknowledges understanding prior to discharge.  Activity Barriers & Risk Stratification:     Activity Barriers & Cardiac Risk Stratification - 02/15/16 1236      Activity Barriers & Cardiac Risk Stratification   Activity Barriers Deconditioning;Muscular Weakness;Incisional Pain;Other (comment)   Comments Chronic Fatigue Syndrome but she crossed out fibromyalgia   Cardiac Risk Stratification High      6 Minute Walk:     6 Minute Walk    Row Name 02/15/16 1526         6 Minute Walk   Phase Initial     Distance 1260 feet     Walk Time 6 minutes     # of Rest Breaks 0     MPH 2.39     METS 2.77     RPE 13     VO2 Peak 1260     Symptoms No     Resting HR 64 bpm     Resting BP 138/62     Max Ex. HR 88 bpm     Max Ex. BP 128/60     2 Minute Post BP 122/60        Initial Exercise Prescription:     Initial Exercise Prescription - 02/15/16 1500      Date of Initial Exercise RX and Referring Provider   Date 02/15/16   Referring Provider Bartholome Bill MD     Treadmill   MPH 2.3   Grade 0   Minutes 15   METs 2.76     Bike   Level 0.5   Minutes 15   METs 2     NuStep   Level 2   Minutes 15   METs 2     Biostep-RELP   Level 2   Minutes 15   METs 2     Prescription Details   Frequency (times per week) 2   Duration Progress to 45 minutes of aerobic exercise without signs/symptoms of physical distress     Intensity   THRR 40-80% of Max Heartrate 97-130   Ratings of Perceived Exertion 11-15   Perceived Dyspnea 0-4     Progression   Progression Continue to progress workloads to maintain intensity without signs/symptoms of physical distress.     Resistance Training   Training Prescription Yes   Weight 2 lbs   Reps 10-15      Perform Capillary Blood Glucose checks as needed.  Exercise Prescription Changes:     Exercise Prescription Changes    Row Name 02/15/16 1500              Exercise Review   Progression -  walk test results  Response to Exercise   Blood Pressure (Admit) 138/62       Blood Pressure (Exercise) 128/60       Blood Pressure (Exit) 122/60       Heart Rate (Admit) 64 bpm       Heart Rate (Exercise) 88 bpm       Heart Rate (Exit) 70 bpm       Oxygen Saturation (Admit) 93 %       Oxygen Saturation (Exercise) 100 %       Rating of Perceived Exertion (Exercise) 13       Symptoms none          Exercise Comments:   Discharge Exercise Prescription (Final Exercise Prescription Changes):     Exercise Prescription Changes - 02/15/16 1500      Exercise Review   Progression --  walk test results     Response to Exercise   Blood Pressure (Admit) 138/62   Blood Pressure (Exercise) 128/60   Blood Pressure (Exit) 122/60   Heart Rate (Admit) 64 bpm   Heart Rate (Exercise) 88 bpm   Heart Rate (Exit) 70 bpm   Oxygen Saturation (Admit) 93 %   Oxygen Saturation (Exercise) 100 %   Rating of Perceived Exertion (Exercise) 13   Symptoms none      Nutrition:  Target Goals: Understanding of nutrition guidelines, daily intake of sodium 1500mg , cholesterol 200mg , calories 30% from fat and 7% or less from saturated fats, daily to have 5 or more servings of fruits and vegetables.  Biometrics:     Pre Biometrics - 02/15/16 1531      Pre Biometrics   Height 5' 5.4" (1.661 m)   Weight 131 lb 3.2 oz (59.5 kg)   Waist Circumference 29 inches   Hip Circumference 36 inches   Waist to Hip Ratio 0.81 %   BMI (Calculated) 21.6   Single Leg Stand 30 seconds       Nutrition Therapy Plan and Nutrition Goals:     Nutrition Therapy & Goals - 02/15/16 1241      Nutrition Therapy   Drug/Food Interactions Statins/Certain Fruits     Personal Nutrition Goals   Comments Appt made for Azura with the [employer].      Intervention Plan   Intervention Prescribe, educate and counsel regarding  individualized specific dietary modifications aiming towards targeted core components such as weight, hypertension, lipid management, diabetes, heart failure and other comorbidities.;Nutrition handout(s) given to patient.   Expected Outcomes Short Term Goal: Understand basic principles of dietary content, such as calories, fat, sodium, cholesterol and nutrients.;Short Term Goal: A plan has been developed with personal nutrition goals set during dietitian appointment.      Nutrition Discharge: Rate Your Plate Scores:     Nutrition Assessments - 02/16/16 1131      MEDFICTS Scores   Pre Score 40      Nutrition Goals Re-Evaluation:   Psychosocial: Target Goals: Acknowledge presence or absence of depression, maximize coping skills, provide positive support system. Participant is able to verbalize types and ability to use techniques and skills needed for reducing stress and depression.  Initial Review & Psychosocial Screening:     Initial Psych Review & Screening - 02/15/16 [place]? Yes   Comments History Chronic Fatigue Syndrome since 1991. Flare ups occur     Screening Interventions   Interventions Encouraged to exercise      Quality  of Life Scores:     Quality of Life - 02/15/16 1042      Quality of Life Scores   Health/Function Pre 25.67 %   Socioeconomic Pre 28.75 %   Psych/Spiritual Pre 30 %   Family Pre 28.5 %   GLOBAL Pre 27.58 %      PHQ-9: Recent Review Flowsheet Data    Depression screen [facility] 2/9 02/15/2016   Decreased Interest 0   Down, Depressed, Hopeless 0   PHQ - 2 Score 0   Altered sleeping 0   Tired, decreased energy 1    Change in appetite 0   Feeling bad or failure about yourself  0   Trouble concentrating 0   Moving slowly or fidgety/restless 0   Suicidal thoughts 0   PHQ-9 Score 1   Difficult doing work/chores Not difficult at all      Psychosocial Evaluation and Intervention:   Psychosocial  Re-Evaluation:   Vocational Rehabilitation: Provide vocational rehab assistance to qualifying candidates.   Vocational Rehab Evaluation & Intervention:     Vocational Rehab - 02/15/16 1041      Initial Vocational Rehab Evaluation & Intervention   Assessment shows need for Vocational Rehabilitation No      Education: Education Goals: Education classes will be provided on a weekly basis, covering required topics. Participant will state understanding/return demonstration of topics presented.  Learning Barriers/Preferences:     Learning Barriers/Preferences - 02/15/16 1041      Learning Barriers/Preferences   Learning Barriers Hearing  wears hearing aides   Learning Preferences Pictoral;Individual Instruction      Education Topics: General Nutrition Guidelines/Fats and Fiber: -Group instruction provided by verbal, written material, models and posters to present the general guidelines for heart healthy nutrition. Gives an explanation and review of dietary fats and fiber.   Controlling Sodium/Reading Food Labels: -Group verbal and written material supporting the discussion of sodium use in heart healthy nutrition. Review and explanation with models, verbal and written materials for utilization of the food label.   Exercise Physiology & Risk Factors: - Group verbal and written instruction with models to review the exercise physiology of the cardiovascular system and associated critical values. Details cardiovascular disease risk factors and the goals associated with each risk factor.   Aerobic Exercise & Resistance Training: - Gives group verbal and written discussion on the health impact of inactivity. On the components of aerobic and resistive training programs and the benefits of this training and how to safely progress through these programs.   Flexibility, Balance, General Exercise Guidelines: - Provides group verbal and written instruction on the benefits of  flexibility and balance training programs. Provides general exercise guidelines with specific guidelines to those with heart or lung disease. Demonstration and skill practice provided.   Stress Management: - Provides group verbal and written instruction about the health risks of elevated stress, cause of high stress, and healthy ways to reduce stress.   Depression: - Provides group verbal and written instruction on the correlation between heart/lung disease and depressed mood, treatment options, and the stigmas associated with seeking treatment.   Anatomy & Physiology of the Heart: - Group verbal and written instruction and models provide basic cardiac anatomy and physiology, with the coronary electrical and arterial systems. Review of: AMI, Angina, Valve disease, Heart Failure, Cardiac Arrhythmia, Pacemakers, and the ICD.   Cardiac Procedures: - Group verbal and written instruction and models to describe the testing methods done to diagnose heart disease. Reviews the outcomes of the  test results. Describes the treatment choices: Medical Management, Angioplasty, or Coronary Bypass Surgery.   Cardiac Medications: - Group verbal and written instruction to review commonly prescribed medications for heart disease. Reviews the medication, class of the drug, and side effects. Includes the steps to properly store meds and maintain the prescription regimen.   Go Sex-Intimacy & Heart Disease, Get SMART - Goal Setting: - Group verbal and written instruction through game format to discuss heart disease and the return to sexual intimacy. Provides group verbal and written material to discuss and apply goal setting through the application of the S.M.A.R.T. Method.   Other Matters of the Heart: - Provides group verbal, written materials and models to describe Heart Failure, Angina, Valve Disease, and Diabetes in the realm of heart disease. Includes description of the disease process and treatment  options available to the cardiac patient.   Exercise & Equipment Safety: - Individual verbal instruction and demonstration of equipment use and safety with use of the equipment. Flowsheet Row Cardiac Rehab from 02/15/2016 in [facility] Cardiac and Pulmonary Rehab  Date  02/15/16  Educator  Loletha Grayer ENterkinRN  Instruction Review Code  1- partially meets, needs review/practice      Infection Prevention: - Provides verbal and written material to individual with discussion of infection control including proper hand washing and proper equipment cleaning during exercise session. Flowsheet Row Cardiac Rehab from 02/15/2016 in [facility] Cardiac and Pulmonary Rehab  Date  02/15/16  Educator  C. EnterkinRN  Instruction Review Code  2- meets goals/outcomes      Falls Prevention: - Provides verbal and written material to individual with discussion of falls prevention and safety. Flowsheet Row Cardiac Rehab from 02/15/2016 in [facility] Cardiac and Pulmonary Rehab  Date  02/15/16  Educator  C. Volin  Instruction Review Code  2- meets goals/outcomes      Diabetes: - Individual verbal and written instruction to review signs/symptoms of diabetes, desired ranges of glucose level fasting, after meals and with exercise. Advice that pre and post exercise glucose checks will be done for 3 sessions at entry of program. Flowsheet Row Cardiac Rehab from 02/15/2016 in [facility] Cardiac and Pulmonary Rehab  Date  02/15/16       Knowledge Questionnaire Score:     Knowledge Questionnaire Score - 02/15/16 1239      Knowledge Questionnaire Score   Pre Score 27/28      Core Components/Risk Factors/Patient Goals at Admission:     Personal Goals and Risk Factors at Admission - 02/15/16 1041      Core Components/Risk Factors/Patient Goals on Admission    Weight Management Yes;Weight Maintenance   Intervention Weight Management: Develop a combined nutrition and exercise program designed to reach desired caloric intake,  while maintaining appropriate intake of nutrient and fiber, sodium and fats, and appropriate energy expenditure required for the weight goal.;Weight Management: Provide education and appropriate resources to help participant work on and attain dietary goals.   Admit Weight 131 lb 3.2 oz (59.5 kg)   Expected Outcomes Weight Maintenance: Understanding of the daily nutrition guidelines, which includes 25-35% calories from fat, 7% or less cal from saturated fats, less than 200mg  cholesterol, less than 1.5gm of sodium, & 5 or more servings of fruits and vegetables daily   Sedentary Yes   Intervention Provide advice, education, support and counseling about physical activity/exercise needs.;Develop an individualized exercise prescription for aerobic and resistive training based on initial evaluation findings, risk stratification, comorbidities and participant's personal goals.   Expected Outcomes Achievement  of increased cardiorespiratory fitness and enhanced flexibility, muscular endurance and strength shown through measurements of functional capacity and personal statement of participant.   Increase Strength and Stamina Yes   Intervention Provide advice, education, support and counseling about physical activity/exercise needs.;Develop an individualized exercise prescription for aerobic and resistive training based on initial evaluation findings, risk stratification, comorbidities and participant's personal goals.   Expected Outcomes Achievement of increased cardiorespiratory fitness and enhanced flexibility, muscular endurance and strength shown through measurements of functional capacity and personal statement of participant.   Hypertension Yes   Intervention Provide education on lifestyle modifcations including regular physical activity/exercise, weight management, moderate sodium restriction and increased consumption of fresh fruit, vegetables, and low fat dairy, alcohol moderation, and smoking  cessation.;Monitor prescription use compliance.   Expected Outcomes Short Term: Continued assessment and intervention until BP is < 140/62mm HG in hypertensive participants. < 130/33mm HG in hypertensive participants with diabetes, heart failure or chronic kidney disease.;Long Term: Maintenance of blood pressure at goal levels.   Lipids Yes   Intervention Provide education and support for participant on nutrition & aerobic/resistive exercise along with prescribed medications to achieve LDL 70mg , HDL >40mg .   Expected Outcomes Short Term: Participant states understanding of desired cholesterol values and is compliant with medications prescribed. Participant is following exercise prescription and nutrition guidelines.;Long Term: Cholesterol controlled with medications as prescribed, with individualized exercise RX and with personalized nutrition plan. Value goals: LDL < 70mg , HDL > 40 mg.      Core Components/Risk Factors/Patient Goals Review:    Core Components/Risk Factors/Patient Goals at Discharge (Final Review):    ITP Comments:     ITP Comments    Row Name 02/15/16 1043 02/15/16 1237 02/15/16 1242 02/17/16 0627     ITP Comments ITP created during Medical Review/Orientation appt. Documentation of DX CHL/EPIC Encounter dated 12/30/2015 Chronic Fatigue Syndrome but she crossed out fibromyalgi Appt made for Jayniah with the [employer]. 30 day review. Continue with ITP unless directed changes per Medical Director review. New to program       Comments:

## 2016-02-18 ENCOUNTER — Encounter: Payer: Commercial Managed Care - HMO | Attending: Cardiology | Admitting: *Deleted

## 2016-02-18 DIAGNOSIS — Z951 Presence of aortocoronary bypass graft: Secondary | ICD-10-CM | POA: Insufficient documentation

## 2016-02-18 NOTE — Progress Notes (Signed)
Daily Session Note  Patient Details  Name: Sheena Rice MRN: [id number] Date of Birth: Oct 04, 1940 Referring Provider:   Flowsheet Row Cardiac Rehab from 02/15/2016 in [facility] Cardiac and Pulmonary Rehab  Referring Provider  Bartholome Bill MD      Encounter Date: 02/18/2016  Check In:     Session Check In - 02/18/16 2256      Check-In   Location [facility]-Cardiac & Pulmonary Rehab   Staff Present Alberteen Sam, MA, ACSM RCEP, Exercise Physiologist;Susanne Bice, RN, BSN, Lance Sell, BA, ACSM CEP, Exercise Physiologist;Other   Supervising physician immediately available to respond to emergencies See telemetry face sheet for immediately available ER MD   Medication changes reported     No   Fall or balance concerns reported    No   Warm-up and Cool-down Performed on first and last piece of equipment   Resistance Training Performed Yes   VAD Patient? No     Pain Assessment   Currently in Pain? No/denies   Multiple Pain Sites No         Goals Met:  Independence with exercise equipment Exercise tolerated well No report of cardiac concerns or symptoms Strength training completed today  Goals Unmet:  Not Applicable  Comments: First full day of exercise!  Patient was oriented to gym and equipment including functions, settings, policies, and procedures.  Patient's individual exercise prescription and treatment plan were reviewed.  All starting workloads were established based on the results of the 6 minute walk test done at initial orientation visit.  The plan for exercise progression was also introduced and progression will be customized based on patient's performance and goals.    Dr. Emily Filbert is Medical Director for [place] and [facility].

## 2016-02-23 ENCOUNTER — Encounter: Payer: Commercial Managed Care - HMO | Admitting: Respiratory Therapy

## 2016-02-23 DIAGNOSIS — Z951 Presence of aortocoronary bypass graft: Secondary | ICD-10-CM | POA: Diagnosis not present

## 2016-02-23 NOTE — Progress Notes (Signed)
Daily Session Note  Patient Details  Name: Sheena Rice MRN: [id number] Date of Birth: Jun 14, 1940 Referring Provider:   Flowsheet Row Cardiac Rehab from 02/15/2016 in [facility] Cardiac and Pulmonary Rehab  Referring Provider  Bartholome Bill MD      Encounter Date: 02/23/2016  Check In:     Session Check In - 02/23/16 0941      Check-In   Location [facility]-Cardiac & Pulmonary Rehab   Staff Present Alberteen Sam, MA, ACSM RCEP, Exercise Physiologist;Laureen Owens Shark, BS, RRT, Respiratory Therapist;Carroll Enterkin, RN, BSN   Supervising physician immediately available to respond to emergencies See telemetry face sheet for immediately available ER MD   Medication changes reported     No   Fall or balance concerns reported    No   Warm-up and Cool-down Performed on first and last piece of equipment   Resistance Training Performed Yes   VAD Patient? No     Pain Assessment   Currently in Pain? No/denies   Multiple Pain Sites No         Goals Met:  Proper associated with RPD/PD & O2 Sat Independence with exercise equipment Exercise tolerated well No report of cardiac concerns or symptoms Strength training completed today  Goals Unmet:  Not Applicable  Comments: Pt able to follow exercise prescription today without complaint.  Will continue to monitor for progression.   Dr. Emily Filbert is Medical Director for [place] and [facility].

## 2016-02-25 DIAGNOSIS — Z951 Presence of aortocoronary bypass graft: Secondary | ICD-10-CM | POA: Diagnosis not present

## 2016-02-25 DIAGNOSIS — I10 Essential (primary) hypertension: Secondary | ICD-10-CM | POA: Diagnosis not present

## 2016-02-25 DIAGNOSIS — I2510 Atherosclerotic heart disease of native coronary artery without angina pectoris: Secondary | ICD-10-CM | POA: Diagnosis not present

## 2016-02-25 NOTE — Progress Notes (Signed)
Daily Session Note  Patient Details  Name: Sheena Rice MRN: [id number] Date of Birth: September 16, 1940 Referring Provider:   Flowsheet Row Cardiac Rehab from 02/15/2016 in [facility] Cardiac and Pulmonary Rehab  Referring Provider  Bartholome Bill MD      Encounter Date: 02/25/2016  Check In:     Session Check In - 02/25/16 0847      Check-In   Location [facility]-Cardiac & Pulmonary Rehab   Staff Present Alberteen Sam, MA, ACSM RCEP, Exercise Physiologist;Patricia Surles RN Vickki Hearing, BA, ACSM CEP, Exercise Physiologist   Supervising physician immediately available to respond to emergencies See telemetry face sheet for immediately available ER MD   Medication changes reported     No   Fall or balance concerns reported    No   Warm-up and Cool-down Performed on first and last piece of equipment   Resistance Training Performed Yes   VAD Patient? No     Pain Assessment   Currently in Pain? No/denies   Multiple Pain Sites No           Exercise Prescription Changes - 02/24/16 1500      Exercise Review   Progression Yes     Response to Exercise   Blood Pressure (Admit) 128/74   Blood Pressure (Exercise) 160/80   Blood Pressure (Exit) 126/64   Heart Rate (Admit) 74 bpm   Heart Rate (Exercise) 98 bpm   Heart Rate (Exit) 70 bpm   Rating of Perceived Exertion (Exercise) 13   Symptoms none   Duration Progress to 30 minutes of continuous aerobic without signs/symptoms of physical distress   Intensity THRR unchanged     Progression   Progression Continue to progress workloads to maintain intensity without signs/symptoms of physical distress.   Average METs 2.49     Resistance Training   Training Prescription Yes   Weight 2 lbs   Reps 10-15     Interval Training   Interval Training No     Treadmill   MPH 2.3   Grade 0   Minutes 15   METs 2.76     Bike   Level 0.5   Minutes 15   METs 2.5     NuStep   Level 2   Minutes 15   METs 2.4      Goals Met:   Independence with exercise equipment Exercise tolerated well No report of cardiac concerns or symptoms Strength training completed today  Goals Unmet:  Not Applicable  Comments: Pt able to follow exercise prescription today without complaint.  Will continue to monitor for progression.    Dr. Emily Filbert is Medical Director for [place] and [facility].

## 2016-02-26 DIAGNOSIS — I10 Essential (primary) hypertension: Secondary | ICD-10-CM | POA: Diagnosis not present

## 2016-02-26 DIAGNOSIS — I249 Acute ischemic heart disease, unspecified: Secondary | ICD-10-CM | POA: Diagnosis not present

## 2016-02-26 DIAGNOSIS — E7800 Pure hypercholesterolemia, unspecified: Secondary | ICD-10-CM | POA: Diagnosis not present

## 2016-02-26 DIAGNOSIS — I2510 Atherosclerotic heart disease of native coronary artery without angina pectoris: Secondary | ICD-10-CM | POA: Diagnosis not present

## 2016-03-01 ENCOUNTER — Encounter: Payer: Commercial Managed Care - HMO | Admitting: Respiratory Therapy

## 2016-03-01 DIAGNOSIS — Z951 Presence of aortocoronary bypass graft: Secondary | ICD-10-CM

## 2016-03-01 NOTE — Progress Notes (Signed)
Daily Session Note  Patient Details  Name: Sheena Rice MRN: [id number] Date of Birth: 04-28-1940 Referring Provider:   Flowsheet Row Cardiac Rehab from 02/15/2016 in [facility] Cardiac and Pulmonary Rehab  Referring Provider  Bartholome Bill MD      Encounter Date: 03/01/2016  Check In:     Session Check In - 03/01/16 0820      Check-In   Location [facility]-Cardiac & Pulmonary Rehab   Staff Present Alberteen Sam, MA, ACSM RCEP, Exercise Physiologist;Laureen Owens Shark, BS, RRT, Respiratory Therapist;Carroll Enterkin, RN, BSN   Supervising physician immediately available to respond to emergencies See telemetry face sheet for immediately available ER MD   Medication changes reported     No   Fall or balance concerns reported    No   Warm-up and Cool-down Performed on first and last piece of equipment   Resistance Training Performed Yes   VAD Patient? No     Pain Assessment   Currently in Pain? No/denies   Multiple Pain Sites No         Goals Met:  Independence with exercise equipment Exercise tolerated well No report of cardiac concerns or symptoms Strength training completed today  Goals Unmet:  Not Applicable  Comments: Pt able to follow exercise prescription today without complaint.  Will continue to monitor for progression.   Dr. Emily Filbert is Medical Director for [place] and [facility].

## 2016-03-03 DIAGNOSIS — Z951 Presence of aortocoronary bypass graft: Secondary | ICD-10-CM

## 2016-03-03 NOTE — Progress Notes (Signed)
Daily Session Note  Patient Details  Name: Sheena Rice MRN: [id number] Date of Birth: 03/24/1940 Referring Provider:   Flowsheet Row Cardiac Rehab from 02/15/2016 in [facility] Cardiac and Pulmonary Rehab  Referring Provider  Bartholome Bill MD      Encounter Date: 03/03/2016  Check In:     Session Check In - 03/03/16 0913      Check-In   Location [facility]-Cardiac & Pulmonary Rehab   Staff Present Alberteen Sam, MA, ACSM RCEP, Exercise Physiologist;Patricia Surles RN Vickki Hearing, BA, ACSM CEP, Exercise Physiologist   Supervising physician immediately available to respond to emergencies See telemetry face sheet for immediately available ER MD   Medication changes reported     No   Fall or balance concerns reported    No   Warm-up and Cool-down Performed on first and last piece of equipment   Resistance Training Performed Yes   VAD Patient? No     Pain Assessment   Currently in Pain? No/denies         Goals Met:  Independence with exercise equipment Exercise tolerated well No report of cardiac concerns or symptoms Strength training completed today  Goals Unmet:  Not Applicable  Comments: Pt able to follow exercise prescription today without complaint.  Will continue to monitor for progression.    Dr. Emily Filbert is Medical Director for [place] and [facility].

## 2016-03-08 ENCOUNTER — Encounter: Payer: Commercial Managed Care - HMO | Admitting: Respiratory Therapy

## 2016-03-08 DIAGNOSIS — Z951 Presence of aortocoronary bypass graft: Secondary | ICD-10-CM | POA: Diagnosis not present

## 2016-03-08 NOTE — Progress Notes (Signed)
Daily Session Note  Patient Details  Name: Sheena Rice MRN: [id number] Date of Birth: 04/21/1940 Referring Provider:   Flowsheet Row Cardiac Rehab from 02/15/2016 in [facility] Cardiac and Pulmonary Rehab  Referring Provider  Bartholome Bill MD      Encounter Date: 03/08/2016  Check In:     Session Check In - 03/08/16 0837      Check-In   Location [facility]-Cardiac & Pulmonary Rehab   Staff Present Heath Lark, RN, BSN, CCRP;Laureen Owens Shark, BS, RRT, Respiratory Therapist;Carroll Enterkin, RN, BSN   Supervising physician immediately available to respond to emergencies See telemetry face sheet for immediately available ER MD   Medication changes reported     No   Fall or balance concerns reported    No   Warm-up and Cool-down Performed on first and last piece of equipment   Resistance Training Performed Yes   VAD Patient? No     Pain Assessment   Currently in Pain? No/denies   Multiple Pain Sites No         Goals Met:  Proper associated with RPD/PD & O2 Sat Independence with exercise equipment Exercise tolerated well No report of cardiac concerns or symptoms Strength training completed today  Goals Unmet:  Not Applicable  Comments: Pt able to follow exercise prescription today without complaint.  Will continue to monitor for progression.   Dr. Emily Filbert is Medical Director for [place] and [facility].

## 2016-03-10 ENCOUNTER — Encounter: Payer: Commercial Managed Care - HMO | Admitting: *Deleted

## 2016-03-10 DIAGNOSIS — Z951 Presence of aortocoronary bypass graft: Secondary | ICD-10-CM | POA: Diagnosis not present

## 2016-03-10 NOTE — Progress Notes (Signed)
Daily Session Note  Patient Details  Name: Sheena Rice MRN: [id number] Date of Birth: [birth year] Referring Provider:   Flowsheet Row Cardiac Rehab from 02/15/2016 in [facility] Cardiac and Pulmonary Rehab  Referring Provider  Bartholome Bill MD      Encounter Date: 03/10/2016  Check In:     Session Check In - 03/10/16 1017      Check-In   Location [facility]-Cardiac & Pulmonary Rehab   Staff Present Alberteen Sam, MA, ACSM RCEP, Exercise Physiologist;Other;Amanda Oletta Darter, BA, ACSM CEP, Exercise Physiologist  Darel Hong, RN   Supervising physician immediately available to respond to emergencies See telemetry face sheet for immediately available ER MD   Medication changes reported     No   Fall or balance concerns reported    No   Warm-up and Cool-down Performed on first and last piece of equipment   Resistance Training Performed Yes   VAD Patient? No     Pain Assessment   Currently in Pain? No/denies   Multiple Pain Sites No           Exercise Prescription Changes - 03/09/16 1500      Exercise Review   Progression Yes     Response to Exercise   Blood Pressure (Admit) 128/60   Blood Pressure (Exercise) 134/64   Blood Pressure (Exit) 144/62   Heart Rate (Admit) 76 bpm   Heart Rate (Exercise) 119 bpm   Heart Rate (Exit) 62 bpm   Rating of Perceived Exertion (Exercise) 13   Symptoms none   Duration Progress to 45 minutes of aerobic exercise without signs/symptoms of physical distress   Intensity THRR unchanged     Progression   Progression Continue to progress workloads to maintain intensity without signs/symptoms of physical distress.   Average METs 3.65     Resistance Training   Training Prescription Yes   Weight 2 lbs   Reps 10-15     Interval Training   Interval Training No     Treadmill   MPH 2.5   Grade 1   Minutes 15   METs 3.24     NuStep   Level 4   Minutes 15   METs 3.1     REL-XR   Level 1   Minutes 15   METs 4.6      Goals Met:   Independence with exercise equipment Exercise tolerated well Personal goals reviewed No report of cardiac concerns or symptoms Strength training completed today  Goals Unmet:  Not Applicable  Comments: Pt able to follow exercise prescription today without complaint.  Will continue to monitor for progression.    Dr. Emily Filbert is Medical Director for [place] and [facility].

## 2016-03-15 ENCOUNTER — Encounter: Payer: Commercial Managed Care - HMO | Admitting: Respiratory Therapy

## 2016-03-15 DIAGNOSIS — Z951 Presence of aortocoronary bypass graft: Secondary | ICD-10-CM

## 2016-03-15 NOTE — Progress Notes (Signed)
Daily Session Note  Patient Details  Name: Sheena Rice MRN: [id number] Date of Birth: [birth year] Referring Provider:   Flowsheet Row Cardiac Rehab from 02/15/2016 in [facility] Cardiac and Pulmonary Rehab  Referring Provider  Bartholome Bill MD      Encounter Date: 03/15/2016  Check In:     Session Check In - 03/15/16 0826      Check-In   Location [facility]-Cardiac & Pulmonary Rehab   Staff Present Alberteen Sam, MA, ACSM RCEP, Exercise Physiologist;Susanne Bice, RN, BSN, CCRP;Laureen Owens Shark, BS, RRT, Respiratory Therapist   Supervising physician immediately available to respond to emergencies See telemetry face sheet for immediately available ER MD   Medication changes reported     No   Fall or balance concerns reported    No   Warm-up and Cool-down Performed on first and last piece of equipment   Resistance Training Performed Yes   VAD Patient? No     Pain Assessment   Currently in Pain? No/denies   Multiple Pain Sites No         History  Smoking Status   Never Smoker  Smokeless Tobacco   Never Used    Goals Met:  Proper associated with RPD/PD & O2 Sat Independence with exercise equipment Exercise tolerated well No report of cardiac concerns or symptoms Strength training completed today  Goals Unmet:  Not Applicable  Comments: Pt able to follow exercise prescription today without complaint.  Will continue to monitor for progression.   Dr. Emily Filbert is Medical Director for [place] and [facility].

## 2016-03-16 ENCOUNTER — Encounter: Payer: Self-pay | Admitting: *Deleted

## 2016-03-16 DIAGNOSIS — Z951 Presence of aortocoronary bypass graft: Secondary | ICD-10-CM

## 2016-03-16 NOTE — Progress Notes (Signed)
Cardiac Individual Treatment Plan  Patient Details  Name: Sheena Rice MRN: [id number] Date of Birth: [birth year] Referring Provider:   Flowsheet Row Cardiac Rehab from 02/15/2016 in [facility] Cardiac and Pulmonary Rehab  Referring Provider  Bartholome Bill MD      Initial Encounter Date:  Flowsheet Row Cardiac Rehab from 02/15/2016 in [facility] Cardiac and Pulmonary Rehab  Date  02/15/16  Referring Provider  Bartholome Bill MD      Visit Diagnosis: S/P CABG x 4  Patient's Home Medications on Admission:  Current Outpatient Prescriptions:  .  acetaminophen (TYLENOL) 325 MG tablet, Take 2 tablets (650 mg total) by mouth every 6 (six) hours as needed for mild pain or headache. (Patient taking differently: Take 500 mg by mouth every 6 (six) hours as needed for mild pain or headache. ), Disp: , Rfl:  .  aspirin EC 81 MG EC tablet, Take 1 tablet (81 mg total) by mouth daily., Disp: 30 tablet, Rfl: 0 .  Cholecalciferol (VITAMIN D3) 5000 units CAPS, Take 5,000 Units by mouth daily. , Disp: , Rfl:  .  colestipol (COLESTID) 1 g tablet, Take 2 tablets by mouth 2 (two) times daily., Disp: , Rfl:  .  cyclobenzaprine (FLEXERIL) 10 MG tablet, Take 10 mg by mouth at bedtime. , Disp: , Rfl:  .  esomeprazole (NEXIUM) 20 MG capsule, Take 20 mg by mouth every morning. , Disp: , Rfl:  .  Flaxseed, Linseed, (FLAX SEED OIL PO), Take 1,400 mg by mouth daily., Disp: , Rfl:  .  gemfibrozil (LOPID) 600 MG tablet, Take 600 mg by mouth 2 (two) times daily. , Disp: , Rfl:  .  guaiFENesin (MUCINEX) 600 MG 12 hr tablet, Take 1 tablet (600 mg total) by mouth every 12 (twelve) hours as needed for cough., Disp: , Rfl:  .  Magnesium Oxide 400 MG CAPS, Take 400 mg by mouth 2 (two) times daily. , Disp: , Rfl:  .  metoprolol tartrate (LOPRESSOR) 25 MG tablet, Take 1 tablet (25 mg total) by mouth 2 (two) times daily., Disp: 30 tablet, Rfl: 3 .  Neomycin-Bacitracin-Polymyxin (TRIPLE ANTIBIOTIC EX), Apply 1 application topically as  needed., Disp: , Rfl:  .  Probiotic Product (PROBIOTIC DAILY) CAPS, Take 1 capsule by mouth daily., Disp: , Rfl:  No current facility-administered medications for this visit.   Facility-Administered Medications Ordered in Other Visits:  .  0.9 %  sodium chloride infusion, 250 mL, Intravenous, PRN, Teodoro Spray, MD .  [EXPIRED] 0.9% sodium chloride infusion, 3 mL/kg/hr, Intravenous, Continuous **FOLLOWED BY** 0.9% sodium chloride infusion, 1 mL/kg/hr, Intravenous, Continuous, Teodoro Spray, MD .  sodium chloride flush (NS) 0.9 % injection 3 mL, 3 mL, Intravenous, Q12H, Teodoro Spray, MD .  sodium chloride flush (NS) 0.9 % injection 3 mL, 3 mL, Intravenous, PRN, Teodoro Spray, MD  Past Medical History: Past Medical History:  Diagnosis Date  . Arthritis    OA  . Chronic fatigue   . Coronary artery disease   . GERD (gastroesophageal reflux disease)   . Hypertension   . Unstable angina (HCC)     Tobacco Use: History  Smoking Status  . Never Smoker  Smokeless Tobacco  . Never Used    Labs: Recent Review Flowsheet Data    Labs for ITP Cardiac and Pulmonary Rehab Latest Ref Rng & Units 12/31/2015 12/31/2015 12/31/2015 12/31/2015 01/01/2016   Hemoglobin A1c 4.8 - 5.6 % - - - - -   PHART 7.350 - 7.450 7.290(L)  7.325(L) - 7.306(L) -   PCO2ART 32.0 - 48.0 mmHg 50.8(H) 42.5 - 41.8 -   HCO3 20.0 - 28.0 mmol/L 24.6 22.3 - 21.0 -   TCO2 0 - 100 mmol/L _0 ACIDBASEDEF 0.0 - 2.0 mmol/L 3.0(H) 4.0(H) - 5.0(H) -   O2SAT % 95.0 97.0 - 97.0 -       Exercise Target Goals:    Exercise Program Goal: Individual exercise prescription set with THRR, safety & activity barriers. Participant demonstrates ability to understand and report RPE using BORG scale, to self-measure pulse accurately, and to acknowledge the importance of the exercise prescription.  Exercise Prescription Goal: Starting with aerobic activity 30 plus minutes a day, 3 days per week for initial exercise  prescription. Provide home exercise prescription and guidelines that participant acknowledges understanding prior to discharge.  Activity Barriers & Risk Stratification:     Activity Barriers & Cardiac Risk Stratification - 02/15/16 1236      Activity Barriers & Cardiac Risk Stratification   Activity Barriers Deconditioning;Muscular Weakness;Incisional Pain;Other (comment)   Comments Chronic Fatigue Syndrome but she crossed out fibromyalgia   Cardiac Risk Stratification High      6 Minute Walk:     6 Minute Walk    Row Name 02/15/16 1526         6 Minute Walk   Phase Initial     Distance 1260 feet     Walk Time 6 minutes     # of Rest Breaks 0     MPH 2.39     METS 2.77     RPE 13     VO2 Peak 1260     Symptoms No     Resting HR 64 bpm     Resting BP 138/62     Max Ex. HR 88 bpm     Max Ex. BP 128/60     2 Minute Post BP 122/60        Oxygen Initial Assessment:   Oxygen Re-Evaluation:   Oxygen Discharge (Final Oxygen Re-Evaluation):   Initial Exercise Prescription:     Initial Exercise Prescription - 02/15/16 1500      Date of Initial Exercise RX and Referring Provider   Date 02/15/16   Referring Provider Bartholome Bill MD     Treadmill   MPH 2.3   Grade 0   Minutes 15   METs 2.76     Bike   Level 0.5   Minutes 15   METs 2     NuStep   Level 2   Minutes 15   METs 2     Biostep-RELP   Level 2   Minutes 15   METs 2     Prescription Details   Frequency (times per week) 2   Duration Progress to 45 minutes of aerobic exercise without signs/symptoms of physical distress     Intensity   THRR 40-80% of Max Heartrate 97-130   Ratings of Perceived Exertion 11-15   Perceived Dyspnea 0-4     Progression   Progression Continue to progress workloads to maintain intensity without signs/symptoms of physical distress.     Resistance Training   Training Prescription Yes   Weight 2 lbs   Reps 10-15      Perform Capillary Blood Glucose  checks as needed.  Exercise Prescription Changes:     Exercise Prescription Changes    Row Name 02/15/16 1500 02/24/16 1500 03/09/16 1500  Response to Exercise   Blood Pressure (Admit) 138/62 128/74 128/60     Blood Pressure (Exercise) 128/60 160/80 134/64     Blood Pressure (Exit) 122/60 126/64 144/62     Heart Rate (Admit) 64 bpm 74 bpm 76 bpm     Heart Rate (Exercise) 88 bpm 98 bpm 119 bpm     Heart Rate (Exit) 70 bpm 70 bpm 62 bpm     Oxygen Saturation (Admit) 93 %  -  -     Oxygen Saturation (Exercise) 100 %  -  -     Rating of Perceived Exertion (Exercise) _0 Symptoms none none none     Duration  - Progress to 30 minutes of continuous aerobic without signs/symptoms of physical distress Progress to 45 minutes of aerobic exercise without signs/symptoms of physical distress     Intensity  - THRR unchanged THRR unchanged       Progression   Progression  - Continue to progress workloads to maintain intensity without signs/symptoms of physical distress. Continue to progress workloads to maintain intensity without signs/symptoms of physical distress.     Average METs  - 2.49 3.65       Resistance Training   Training Prescription  - Yes Yes     Weight  - 2 lbs 2 lbs     Reps  - 10-15 10-15       Interval Training   Interval Training  - No No       Treadmill   MPH  - 2.3 2.5     Grade  - 0 1     Minutes  - 15 15     METs  - 2.76 3.24       Bike   Level  - 0.5  -     Minutes  - 15  -     METs  - 2.5  -       NuStep   Level  - 2 4     Minutes  - 15 15     METs  - 2.4 3.1       REL-XR   Level  -  - 1     Minutes  -  - 15     METs  -  - 4.6       Exercise Review   Progression -  walk test results Yes Yes        Exercise Comments:     Exercise Comments    Row Name 02/18/16 1610 02/24/16 1538 03/09/16 1538       Exercise Comments First full day of exercise!  Patient was oriented to gym and equipment including functions, settings, policies,  and procedures.  Patient's individual exercise prescription and treatment plan were reviewed.  All starting workloads were established based on the results of the 6 minute walk test done at initial orientation visit.  The plan for exercise progression was also introduced and progression will be customized based on patient's performance and goals. Sheena Rice is off to a good start in rehab.  She has mentioned that she was tired after her first day and needed a nap.  I encouraged her that this is normal and hopefully her stamina will improve.  We will continue to monitor her progression. Sheena Rice has continued to do well in rehab.  She is still getting her nap, but feels that she has earned her nap after rehab.  She has traded the bike  out for the recumbent elliptical and finds it more comfortable but challenging.  We will continue to monitor her progress.        Exercise Goals and Review:   Exercise Goals Re-Evaluation :   Discharge Exercise Prescription (Final Exercise Prescription Changes):     Exercise Prescription Changes - 03/09/16 1500      Response to Exercise   Blood Pressure (Admit) 128/60   Blood Pressure (Exercise) 134/64   Blood Pressure (Exit) 144/62   Heart Rate (Admit) 76 bpm   Heart Rate (Exercise) 119 bpm   Heart Rate (Exit) 62 bpm   Rating of Perceived Exertion (Exercise) 13   Symptoms none   Duration Progress to 45 minutes of aerobic exercise without signs/symptoms of physical distress   Intensity THRR unchanged     Progression   Progression Continue to progress workloads to maintain intensity without signs/symptoms of physical distress.   Average METs 3.65     Resistance Training   Training Prescription Yes   Weight 2 lbs   Reps 10-15     Interval Training   Interval Training No     Treadmill   MPH 2.5   Grade 1   Minutes 15   METs 3.24     NuStep   Level 4   Minutes 15   METs 3.1     REL-XR   Level 1   Minutes 15   METs 4.6     Exercise Review    Progression Yes      Nutrition:  Target Goals: Understanding of nutrition guidelines, daily intake of sodium <154m, cholesterol <2019m calories 30% from fat and 7% or less from saturated fats, daily to have 5 or more servings of fruits and vegetables.  Biometrics:     Pre Biometrics - 02/15/16 1531      Pre Biometrics   Height 5' 5.4" (1.661 m)   Weight 131 lb 3.2 oz (59.5 kg)   Waist Circumference 29 inches   Hip Circumference 36 inches   Waist to Hip Ratio 0.81 %   BMI (Calculated) 21.6   Single Leg Stand 30 seconds       Nutrition Therapy Plan and Nutrition Goals:     Nutrition Therapy & Goals - 03/01/16 1152      Nutrition Therapy   Diet Patient accompanied by her husband was instructed on Heart Healthy Dietary Guidelines based on 1500 calories.   Protein (specify units) 6   Fiber 20 grams   Whole Grain Foods 3 servings   Saturated Fats 11 max. grams   Fruits and Vegetables 5 servings/day   Sodium 1500 grams     Personal Nutrition Goals   Nutrition Goal Read labels for saturated fat, trans fat and sodium.   Personal Goal #2 Include at least 6 oz protein foods daily.   Personal Goal #3 Include whole grain servings daily (oatmeal, brown rice, popcorn, corn, 100& whole wheat bread)     Intervention Plan   Intervention Prescribe, educate and counsel regarding individualized specific dietary modifications aiming towards targeted core components such as weight, hypertension, lipid management, diabetes, heart failure and other comorbidities.;Nutrition handout(s) given to patient.   Expected Outcomes Short Term Goal: Understand basic principles of dietary content, such as calories, fat, sodium, cholesterol and nutrients.;Short Term Goal: A plan has been developed with personal nutrition goals set during dietitian appointment.;Long Term Goal: Adherence to prescribed nutrition plan.      Nutrition Discharge: Rate Your Plate Scores:  Nutrition Assessments - 02/16/16  1131      MEDFICTS Scores   Pre Score 40      Nutrition Goals Re-Evaluation:     Nutrition Goals Re-Evaluation    Arthur Name 03/10/16 1021             Goals   Nutrition Goal Read labels for saturated fat, trans fat and sodium.       Comment Reading more labels         Personal Goal #1 Re-Evaluation   Goal Progress Seen Yes         Personal Goal #2 Re-Evaluation   Personal Goal #2 Include at least 6 oz protein foods daily.       Goal Progress Seen Yes       Re-Evaluation Eating more protein         Personal Goal #3 Re-Evaluation   Personal Goal #3 Include whole grain servings daily (oatmeal, brown rice, popcorn, corn, 100& whole wheat bread)       Goal Progress Seen Yes       Re-Evaluation Looking for whole grains         Intervention Plan   Intervention Continue to educate, counsel and set short/long term goals regarding individualized specific personal dietary modifications.       Comments Sheena Rice and her husband have cut back on eating out and are more mindful when they do go out to eat. Their friends have even begun to notice the changes in thier diets. They are also eating more friuts and vegetables as well. Goals: Continue to work on improving diet and sticking with it.          Nutrition Goals Discharge (Final Nutrition Goals Re-Evaluation):     Nutrition Goals Re-Evaluation - 03/10/16 1021      Goals   Nutrition Goal Read labels for saturated fat, trans fat and sodium.   Comment Reading more labels     Personal Goal #1 Re-Evaluation   Goal Progress Seen Yes     Personal Goal #2 Re-Evaluation   Personal Goal #2 Include at least 6 oz protein foods daily.   Goal Progress Seen Yes   Re-Evaluation Eating more protein     Personal Goal #3 Re-Evaluation   Personal Goal #3 Include whole grain servings daily (oatmeal, brown rice, popcorn, corn, 100& whole wheat bread)   Goal Progress Seen Yes   Re-Evaluation Looking for whole grains     Intervention Plan    Intervention Continue to educate, counsel and set short/long term goals regarding individualized specific personal dietary modifications.   Comments Sheena Rice and her husband have cut back on eating out and are more mindful when they do go out to eat. Their friends have even begun to notice the changes in thier diets. They are also eating more friuts and vegetables as well. Goals: Continue to work on improving diet and sticking with it.      Psychosocial: Target Goals: Acknowledge presence or absence of significant depression and/or stress, maximize coping skills, provide positive support system. Participant is able to verbalize types and ability to use techniques and skills needed for reducing stress and depression.   Initial Review & Psychosocial Screening:     Initial Psych Review & Screening - 02/15/16 [place]? Yes   Comments History Chronic Fatigue Syndrome since 1991. Flare ups occur     Screening Interventions   Interventions Encouraged to exercise  Quality of Life Scores:      Quality of Life - 02/15/16 1042      Quality of Life Scores   Health/Function Pre 25.67 %   Socioeconomic Pre 28.75 %   Psych/Spiritual Pre 30 %   Family Pre 28.5 %   GLOBAL Pre 27.58 %      PHQ-9: Recent Review Flowsheet Data    Depression screen [facility] 2/9 02/15/2016   Decreased Interest 0   Down, Depressed, Hopeless 0   PHQ - 2 Score 0   Altered sleeping 0   Tired, decreased energy 1    Change in appetite 0   Feeling bad or failure about yourself  0   Trouble concentrating 0   Moving slowly or fidgety/restless 0   Suicidal thoughts 0   PHQ-9 Score 1   Difficult doing work/chores Not difficult at all     Interpretation of Total Score  Total Score Depression Severity:  1-4 = Minimal depression, 5-9 = Mild depression, 10-14 = Moderate depression, 15-19 = Moderately severe depression, 20-27 = Severe depression   Psychosocial Evaluation and  Intervention:     Psychosocial Evaluation - 02/18/16 0929      Psychosocial Evaluation & Interventions   Interventions Encouraged to exercise with the program and follow exercise prescription;Stress management education;Relaxation education   Comments Counselor met with Sheena Rice today for initial psychosocial evaluation.  She is a 76 year old who had Quad Bypass in mid-December.  She has a strong support system with a spouse of 69 years and a daughter who lives close by.  Sheena Rice is also actively involved in her local church.  She has Chronic Fatigue Syndrome in addition to her cardiac health issues.  She reports sleeping on the couch still due to some problems breathing when lying flat on a bed since the surgery.  She states her appetite is good.  Sheena Rice denies a history of depression or anxiety and any current symptoms and states she is typically in a positive mood most of the time.  She states her stress is mostly due to her health issues but feels that she is recovering well - it will just take time.  Her goals for this program are to increase her stamina and strength so she can get back outside to "shoot some hoops" with her 75 year old granddaughter.  She has some gym equipment at home and plans to consistently exercise following completion of this program.  Staff will continue to follow with Sheena Rice throughout the course of this program.        Psychosocial Re-Evaluation:     Psychosocial Re-Evaluation    Row Name 03/10/16 1023             Psychosocial Re-Evaluation   Comments Sheena Rice feels that her stress levels are doing good.  She is sleeping well.  Overall, she is feeling good about everything and has no concerns.       Interventions Encouraged to attend Cardiac Rehabilitation for the exercise;Stress management education          Psychosocial Discharge (Final Psychosocial Re-Evaluation):     Psychosocial Re-Evaluation - 03/10/16 1023      Psychosocial Re-Evaluation    Comments Sheena Rice feels that her stress levels are doing good.  She is sleeping well.  Overall, she is feeling good about everything and has no concerns.   Interventions Encouraged to attend Cardiac Rehabilitation for the exercise;Stress management education      Vocational Rehabilitation:  Provide vocational rehab assistance to qualifying candidates.   Vocational Rehab Evaluation & Intervention:     Vocational Rehab - 02/15/16 1041      Initial Vocational Rehab Evaluation & Intervention   Assessment shows need for Vocational Rehabilitation No      Education: Education Goals: Education classes will be provided on a weekly basis, covering required topics. Participant will state understanding/return demonstration of topics presented.  Learning Barriers/Preferences:     Learning Barriers/Preferences - 02/15/16 1041      Learning Barriers/Preferences   Learning Barriers Hearing  wears hearing aides   Learning Preferences Pictoral;Individual Instruction      Education Topics: General Nutrition Guidelines/Fats and Fiber: -Group instruction provided by verbal, written material, models and posters to present the general guidelines for heart healthy nutrition. Gives an explanation and review of dietary fats and fiber.   Controlling Sodium/Reading Food Labels: -Group verbal and written material supporting the discussion of sodium use in heart healthy nutrition. Review and explanation with models, verbal and written materials for utilization of the food label.   Exercise Physiology & Risk Factors: - Group verbal and written instruction with models to review the exercise physiology of the cardiovascular system and associated critical values. Details cardiovascular disease risk factors and the goals associated with each risk factor.   Aerobic Exercise & Resistance Training: - Gives group verbal and written discussion on the health impact of inactivity. On the components of aerobic  and resistive training programs and the benefits of this training and how to safely progress through these programs.   Flexibility, Balance, General Exercise Guidelines: - Provides group verbal and written instruction on the benefits of flexibility and balance training programs. Provides general exercise guidelines with specific guidelines to those with heart or lung disease. Demonstration and skill practice provided.   Stress Management: - Provides group verbal and written instruction about the health risks of elevated stress, cause of high stress, and healthy ways to reduce stress. Flowsheet Row Cardiac Rehab from 03/15/2016 in [facility] Cardiac and Pulmonary Rehab  Date  02/25/16  Educator  TS  Instruction Review Code  2- meets goals/outcomes      Depression: - Provides group verbal and written instruction on the correlation between heart/lung disease and depressed mood, treatment options, and the stigmas associated with seeking treatment.   Anatomy & Physiology of the Heart: - Group verbal and written instruction and models provide basic cardiac anatomy and physiology, with the coronary electrical and arterial systems. Review of: AMI, Angina, Valve disease, Heart Failure, Cardiac Arrhythmia, Pacemakers, and the ICD. Flowsheet Row Cardiac Rehab from 03/15/2016 in [facility] Cardiac and Pulmonary Rehab  Date  02/23/16  Educator  CE  Instruction Review Code  2- meets goals/outcomes      Cardiac Procedures: - Group verbal and written instruction and models to describe the testing methods done to diagnose heart disease. Reviews the outcomes of the test results. Describes the treatment choices: Medical Management, Angioplasty, or Coronary Bypass Surgery. Flowsheet Row Cardiac Rehab from 03/15/2016 in [facility] Cardiac and Pulmonary Rehab  Date  03/01/16  Educator  CE  Instruction Review Code  2- meets goals/outcomes      Cardiac Medications: - Group verbal and written instruction to review commonly  prescribed medications for heart disease. Reviews the medication, class of the drug, and side effects. Includes the steps to properly store meds and maintain the prescription regimen. Flowsheet Row Cardiac Rehab from 03/15/2016 in [facility] Cardiac and Pulmonary Rehab  Date  03/08/16  Educator  SB  Instruction Review Code  2- meets goals/outcomes      Go Sex-Intimacy & Heart Disease, Get SMART - Goal Setting: - Group verbal and written instruction through game format to discuss heart disease and the return to sexual intimacy. Provides group verbal and written material to discuss and apply goal setting through the application of the S.M.A.R.T. Method. Flowsheet Row Cardiac Rehab from 03/15/2016 in [facility] Cardiac and Pulmonary Rehab  Date  03/01/16  Educator  CE  Instruction Review Code  2- meets goals/outcomes      Other Matters of the Heart: - Provides group verbal, written materials and models to describe Heart Failure, Angina, Valve Disease, and Diabetes in the realm of heart disease. Includes description of the disease process and treatment options available to the cardiac patient. Flowsheet Row Cardiac Rehab from 03/15/2016 in [facility] Cardiac and Pulmonary Rehab  Date  02/23/16  Educator  CE  Instruction Review Code  2- meets goals/outcomes      Exercise & Equipment Safety: - Individual verbal instruction and demonstration of equipment use and safety with use of the equipment. Flowsheet Row Cardiac Rehab from 03/15/2016 in [facility] Cardiac and Pulmonary Rehab  Date  02/15/16  Educator  Loletha Grayer ENterkinRN  Instruction Review Code  1- partially meets, needs review/practice      Infection Prevention: - Provides verbal and written material to individual with discussion of infection control including proper hand washing and proper equipment cleaning during exercise session. Flowsheet Row Cardiac Rehab from 03/15/2016 in [facility] Cardiac and Pulmonary Rehab  Date  02/15/16  Educator  C. EnterkinRN   Instruction Review Code  2- meets goals/outcomes      Falls Prevention: - Provides verbal and written material to individual with discussion of falls prevention and safety. Flowsheet Row Cardiac Rehab from 03/15/2016 in [facility] Cardiac and Pulmonary Rehab  Date  02/15/16  Educator  C. Hosmer  Instruction Review Code  2- meets goals/outcomes      Diabetes: - Individual verbal and written instruction to review signs/symptoms of diabetes, desired ranges of glucose level fasting, after meals and with exercise. Advice that pre and post exercise glucose checks will be done for 3 sessions at entry of program. [place] from 03/15/2016 in [facility] Cardiac and Pulmonary Rehab  Date  02/15/16       Knowledge Questionnaire Score:     Knowledge Questionnaire Score - 02/15/16 1239      Knowledge Questionnaire Score   Pre Score 27/28      Core Components/Risk Factors/Patient Goals at Admission:     Personal Goals and Risk Factors at Admission - 02/15/16 1041      Core Components/Risk Factors/Patient Goals on Admission    Weight Management Yes;Weight Maintenance   Intervention Weight Management: Develop a combined nutrition and exercise program designed to reach desired caloric intake, while maintaining appropriate intake of nutrient and fiber, sodium and fats, and appropriate energy expenditure required for the weight goal.;Weight Management: Provide education and appropriate resources to help participant work on and attain dietary goals.   Admit Weight 131 lb 3.2 oz (59.5 kg)   Expected Outcomes Weight Maintenance: Understanding of the daily nutrition guidelines, which includes 25-35% calories from fat, 7% or less cal from saturated fats, less than 281m cholesterol, less than 1.5gm of sodium, & 5 or more servings of fruits and vegetables daily   Sedentary Yes   Intervention Provide advice, education, support and counseling about physical activity/exercise needs.;Develop  an individualized exercise prescription for  aerobic and resistive training based on initial evaluation findings, risk stratification, comorbidities and participant's personal goals.   Expected Outcomes Achievement of increased cardiorespiratory fitness and enhanced flexibility, muscular endurance and strength shown through measurements of functional capacity and personal statement of participant.   Increase Strength and Stamina Yes   Intervention Provide advice, education, support and counseling about physical activity/exercise needs.;Develop an individualized exercise prescription for aerobic and resistive training based on initial evaluation findings, risk stratification, comorbidities and participant's personal goals.   Expected Outcomes Achievement of increased cardiorespiratory fitness and enhanced flexibility, muscular endurance and strength shown through measurements of functional capacity and personal statement of participant.   Hypertension Yes   Intervention Provide education on lifestyle modifcations including regular physical activity/exercise, weight management, moderate sodium restriction and increased consumption of fresh fruit, vegetables, and low fat dairy, alcohol moderation, and smoking cessation.;Monitor prescription use compliance.   Expected Outcomes Short Term: Continued assessment and intervention until BP is < 140/109m HG in hypertensive participants. < 130/838mHG in hypertensive participants with diabetes, heart failure or chronic kidney disease.;Long Term: Maintenance of blood pressure at goal levels.   Lipids Yes   Intervention Provide education and support for participant on nutrition & aerobic/resistive exercise along with prescribed medications to achieve LDL <7030mHDL >53m17m Expected Outcomes Short Term: Participant states understanding of desired cholesterol values and is compliant with medications prescribed. Participant is following exercise prescription and nutrition  guidelines.;Long Term: Cholesterol controlled with medications as prescribed, with individualized exercise RX and with personalized nutrition plan. Value goals: LDL < 70mg82mL > 40 mg.      Core Components/Risk Factors/Patient Goals Review:      Goals and Risk Factor Review    Row Name 03/10/16 1018             Core Components/Risk Factors/Patient Goals Review   Personal Goals Review Weight Management/Obesity;Sedentary;Increase Strength and Stamina;Hypertension;Lipids       Review SheilMarysueff to a good start in rehab.  She does not have any major concerns about the program as her incisional pain is improving..  She has already begun to notice a difference and is pleased with what she has already been able to accomplish in rehab.  She still needs to rest when she gets home, but on Tuesday she was able to also get some house work done!!  She is walking some at home as well.  Blood pressures have been good; no problems with meds.       Expected Outcomes Short: Continue to come to exercise classes and add in exercise at home.  Long: Continue to work on risk factor modification.          Core Components/Risk Factors/Patient Goals at Discharge (Final Review):      Goals and Risk Factor Review - 03/10/16 1018      Core Components/Risk Factors/Patient Goals Review   Personal Goals Review Weight Management/Obesity;Sedentary;Increase Strength and Stamina;Hypertension;Lipids   Review Sheena Rice to a good start in rehab.  She does not have any major concerns about the program as her incisional pain is improving..  She has already begun to notice a difference and is pleased with what she has already been able to accomplish in rehab.  She still needs to rest when she gets home, but on Tuesday she was able to also get some house work done!!  She is walking some at home as well.  Blood pressures have been good; no problems with meds.  Expected Outcomes Short: Continue to come to exercise classes  and add in exercise at home.  Long: Continue to work on risk factor modification.      ITP Comments:     ITP Comments    Row Name 02/15/16 1043 02/15/16 1237 02/15/16 1242 02/17/16 0627 03/16/16 0608   ITP Comments ITP created during Medical Review/Orientation appt. Documentation of DX CHL/EPIC Encounter dated 12/30/2015 Chronic Fatigue Syndrome but she crossed out fibromyalgi Appt made for Devonne with the [employer]. 30 day review. Continue with ITP unless directed changes per Medical Director review. New to program 30 day review. Continue with ITP unless directed changes per Medical Director review      Comments:

## 2016-03-17 ENCOUNTER — Encounter: Payer: Medicare HMO | Attending: Cardiology | Admitting: *Deleted

## 2016-03-17 DIAGNOSIS — Z951 Presence of aortocoronary bypass graft: Secondary | ICD-10-CM | POA: Insufficient documentation

## 2016-03-17 NOTE — Progress Notes (Signed)
Daily Session Note  Patient Details  Name: Sheena Rice MRN: [id number] Date of Birth: Jan 05, 1941 Referring Provider:   Flowsheet Row Cardiac Rehab from 02/15/2016 in [facility] Cardiac and Pulmonary Rehab  Referring Provider  Bartholome Bill MD      Encounter Date: 03/17/2016  Check In:     Session Check In - 03/17/16 0843      Check-In   Location [facility]-Cardiac & Pulmonary Rehab   Staff Present Alberteen Sam, MA, ACSM RCEP, Exercise Physiologist;Patricia Surles RN Vickki Hearing, BA, ACSM CEP, Exercise Physiologist   Supervising physician immediately available to respond to emergencies See telemetry face sheet for immediately available ER MD   Medication changes reported     No   Fall or balance concerns reported    No   Warm-up and Cool-down Performed on first and last piece of equipment   Resistance Training Performed Yes   VAD Patient? No     Pain Assessment   Currently in Pain? No/denies   Multiple Pain Sites No         History  Smoking Status  . Never Smoker  Smokeless Tobacco  . Never Used    Goals Met:  Independence with exercise equipment Exercise tolerated well No report of cardiac concerns or symptoms Strength training completed today  Goals Unmet:  Not Applicable  Comments: Pt able to follow exercise prescription today without complaint.  Will continue to monitor for progression.    Dr. Emily Filbert is Medical Director for [place] and [facility].

## 2016-03-22 ENCOUNTER — Encounter: Payer: Medicare HMO | Admitting: Respiratory Therapy

## 2016-03-22 DIAGNOSIS — Z951 Presence of aortocoronary bypass graft: Secondary | ICD-10-CM | POA: Diagnosis not present

## 2016-03-22 NOTE — Progress Notes (Signed)
Daily Session Note  Patient Details  Name: Sheena Rice MRN: [id number] Date of Birth: 1940/05/05 Referring Provider:   Flowsheet Row Cardiac Rehab from 02/15/2016 in [facility] Cardiac and Pulmonary Rehab  Referring Provider  Bartholome Bill MD      Encounter Date: 03/22/2016  Check In:     Session Check In - 03/22/16 6812      Check-In   Location [facility]-Cardiac & Pulmonary Rehab   Staff Present Alberteen Sam, MA, ACSM RCEP, Exercise Physiologist;Susanne Bice, RN, BSN, CCRP;Laureen Owens Shark, BS, RRT, Respiratory Therapist   Supervising physician immediately available to respond to emergencies See telemetry face sheet for immediately available ER MD   Medication changes reported     No   Fall or balance concerns reported    No   Warm-up and Cool-down Performed on first and last piece of equipment   Resistance Training Performed Yes   VAD Patient? No     Pain Assessment   Currently in Pain? No/denies   Multiple Pain Sites No         History  Smoking Status   Never Smoker  Smokeless Tobacco   Never Used    Goals Met:  Proper associated with RPD/PD & O2 Sat Independence with exercise equipment Exercise tolerated well No report of cardiac concerns or symptoms Strength training completed today  Goals Unmet:  Not Applicable  Comments: Pt able to follow exercise prescription today without complaint.  Will continue to monitor for progression.   Dr. Emily Filbert is Medical Director for [place] and [facility].

## 2016-03-24 DIAGNOSIS — Z951 Presence of aortocoronary bypass graft: Secondary | ICD-10-CM

## 2016-03-24 NOTE — Progress Notes (Signed)
Daily Session Note  Patient Details  Name: Sheena Rice MRN: [id number] Date of Birth: 1940-09-21 Referring Provider:   Flowsheet Row Cardiac Rehab from 02/15/2016 in [facility] Cardiac and Pulmonary Rehab  Referring Provider  Bartholome Bill MD      Encounter Date: 03/24/2016  Check In:     Session Check In - 03/24/16 0828      Check-In   Location [facility]-Cardiac & Pulmonary Rehab   Staff Present Alberteen Sam, MA, ACSM RCEP, Exercise Physiologist;Patricia Surles RN Vickki Hearing, BA, ACSM CEP, Exercise Physiologist   Supervising physician immediately available to respond to emergencies See telemetry face sheet for immediately available ER MD   Medication changes reported     No   Fall or balance concerns reported    No   Warm-up and Cool-down Performed on first and last piece of equipment   Resistance Training Performed Yes   VAD Patient? No     Pain Assessment   Currently in Pain? No/denies           Exercise Prescription Changes - 03/23/16 1500      Response to Exercise   Blood Pressure (Admit) 134/62   Blood Pressure (Exercise) 140/66   Blood Pressure (Exit) 134/70   Heart Rate (Admit) 76 bpm   Heart Rate (Exercise) 116 bpm   Heart Rate (Exit) 75 bpm   Rating of Perceived Exertion (Exercise) 13   Symptoms none   Duration Continue with 45 min of aerobic exercise without signs/symptoms of physical distress.   Intensity THRR unchanged     Progression   Progression Continue to progress workloads to maintain intensity without signs/symptoms of physical distress.   Average METs 5.82     Resistance Training   Training Prescription Yes   Weight 2 lbs   Reps 10-15     Interval Training   Interval Training Yes   Equipment REL-XR   Comments Added some intervals on XR      Treadmill   MPH 2.6   Grade 1   Minutes 15   METs 3.35     NuStep   Level 4   Minutes 15   METs 3.1     REL-XR   Level 3   Minutes 15   METs 10.1     Home Exercise Plan   Plans to  continue exercise at Home (comment)  walking two laps   Frequency Add 3 additional days to program exercise sessions.   Initial Home Exercises Provided 03/15/16      History  Smoking Status  . Never Smoker  Smokeless Tobacco  . Never Used    Goals Met:  Independence with exercise equipment Exercise tolerated well No report of cardiac concerns or symptoms Strength training completed today  Goals Unmet:  Not Applicable  Comments: Pt able to follow exercise prescription today without complaint.  Will continue to monitor for progression.    Dr. Emily Filbert is Medical Director for [place] and [facility].

## 2016-03-31 ENCOUNTER — Other Ambulatory Visit: Payer: Self-pay

## 2016-03-31 ENCOUNTER — Encounter: Payer: Self-pay | Admitting: *Deleted

## 2016-03-31 ENCOUNTER — Observation Stay
Admission: EM | Admit: 2016-03-31 | Discharge: 2016-04-01 | Disposition: A | Discharge status: Other Acute Inpatient Hospital | Payer: Medicare HMO | Attending: Internal Medicine | Admitting: Internal Medicine

## 2016-03-31 ENCOUNTER — Emergency Department: Payer: Medicare HMO

## 2016-03-31 DIAGNOSIS — I10 Essential (primary) hypertension: Secondary | ICD-10-CM | POA: Diagnosis not present

## 2016-03-31 DIAGNOSIS — I2582 Chronic total occlusion of coronary artery: Secondary | ICD-10-CM | POA: Diagnosis not present

## 2016-03-31 DIAGNOSIS — Z951 Presence of aortocoronary bypass graft: Secondary | ICD-10-CM

## 2016-03-31 DIAGNOSIS — R079 Chest pain, unspecified: Secondary | ICD-10-CM | POA: Diagnosis not present

## 2016-03-31 DIAGNOSIS — I340 Nonrheumatic mitral (valve) insufficiency: Secondary | ICD-10-CM | POA: Insufficient documentation

## 2016-03-31 DIAGNOSIS — Z8249 Family history of ischemic heart disease and other diseases of the circulatory system: Secondary | ICD-10-CM | POA: Diagnosis not present

## 2016-03-31 DIAGNOSIS — Z79899 Other long term (current) drug therapy: Secondary | ICD-10-CM | POA: Insufficient documentation

## 2016-03-31 DIAGNOSIS — Z7982 Long term (current) use of aspirin: Secondary | ICD-10-CM | POA: Diagnosis not present

## 2016-03-31 DIAGNOSIS — Z9049 Acquired absence of other specified parts of digestive tract: Secondary | ICD-10-CM | POA: Insufficient documentation

## 2016-03-31 DIAGNOSIS — K219 Gastro-esophageal reflux disease without esophagitis: Secondary | ICD-10-CM | POA: Insufficient documentation

## 2016-03-31 DIAGNOSIS — I25710 Atherosclerosis of autologous vein coronary artery bypass graft(s) with unstable angina pectoris: Secondary | ICD-10-CM | POA: Diagnosis not present

## 2016-03-31 DIAGNOSIS — E785 Hyperlipidemia, unspecified: Secondary | ICD-10-CM | POA: Insufficient documentation

## 2016-03-31 DIAGNOSIS — I25110 Atherosclerotic heart disease of native coronary artery with unstable angina pectoris: Secondary | ICD-10-CM | POA: Diagnosis not present

## 2016-03-31 DIAGNOSIS — I2510 Atherosclerotic heart disease of native coronary artery without angina pectoris: Secondary | ICD-10-CM | POA: Diagnosis not present

## 2016-03-31 DIAGNOSIS — R0789 Other chest pain: Secondary | ICD-10-CM | POA: Diagnosis not present

## 2016-03-31 LAB — COMPREHENSIVE METABOLIC PANEL
ALK PHOS: 90 U/L (ref 38–126)
ALT: 18 U/L (ref 14–54)
AST: 32 U/L (ref 15–41)
Albumin: 3.9 g/dL (ref 3.5–5.0)
Anion gap: 10 (ref 5–15)
BILIRUBIN TOTAL: 0.5 mg/dL (ref 0.3–1.2)
BUN: 22 mg/dL — AB (ref 6–20)
CALCIUM: 9.8 mg/dL (ref 8.9–10.3)
CHLORIDE: 103 mmol/L (ref 101–111)
CO2: 24 mmol/L (ref 22–32)
CREATININE: 0.61 mg/dL (ref 0.44–1.00)
Glucose, Bld: 124 mg/dL — ABNORMAL HIGH (ref 65–99)
Potassium: 4 mmol/L (ref 3.5–5.1)
Sodium: 137 mmol/L (ref 135–145)
Total Protein: 7.6 g/dL (ref 6.5–8.1)

## 2016-03-31 LAB — CBC
HEMATOCRIT: 38.8 % (ref 35.0–47.0)
HEMOGLOBIN: 13.4 g/dL (ref 12.0–16.0)
MCH: 30.1 pg (ref 26.0–34.0)
MCHC: 34.6 g/dL (ref 32.0–36.0)
MCV: 87 fL (ref 80.0–100.0)
PLATELETS: 209 10*3/uL (ref 150–440)
RBC: 4.46 MIL/uL (ref 3.80–5.20)
RDW: 13.3 % (ref 11.5–14.5)
WBC: 5.4 10*3/uL (ref 3.6–11.0)

## 2016-03-31 LAB — TROPONIN I

## 2016-03-31 MED ORDER — COLESTIPOL HCL 1 G PO TABS
2.0000 g | ORAL_TABLET | Freq: Two times a day (BID) | ORAL | Status: DC
  Filled 2016-03-31 (×3): qty 2

## 2016-03-31 MED ORDER — ZOLPIDEM TARTRATE 5 MG PO TABS: 5.0000 mg | ORAL_TABLET | Freq: Every evening | ORAL | Status: DC | PRN

## 2016-03-31 MED ORDER — MORPHINE SULFATE (PF) 4 MG/ML IV SOLN: 2.0000 mg | INTRAVENOUS | Status: DC | PRN

## 2016-03-31 MED ORDER — GI COCKTAIL ~~LOC~~
30.0000 mL | Freq: Four times a day (QID) | ORAL | Status: DC | PRN
  Filled 2016-03-31: qty 30

## 2016-03-31 MED ORDER — PANTOPRAZOLE SODIUM 40 MG PO TBEC
40.0000 mg | DELAYED_RELEASE_TABLET | Freq: Every day | ORAL | Status: DC
  Administered 2016-04-01: 40 mg via ORAL
  Filled 2016-03-31: qty 1

## 2016-03-31 MED ORDER — ONDANSETRON HCL 4 MG/2ML IJ SOLN: 4.0000 mg | Freq: Four times a day (QID) | INTRAMUSCULAR | Status: DC | PRN

## 2016-03-31 MED ORDER — ASPIRIN EC 81 MG PO TBEC
81.0000 mg | DELAYED_RELEASE_TABLET | Freq: Every day | ORAL | Status: DC
  Administered 2016-04-01: 81 mg via ORAL
  Filled 2016-03-31: qty 1

## 2016-03-31 MED ORDER — CYCLOBENZAPRINE HCL 10 MG PO TABS
10.0000 mg | ORAL_TABLET | Freq: Every day | ORAL | Status: DC
  Administered 2016-03-31: 10 mg via ORAL
  Filled 2016-03-31: qty 1

## 2016-03-31 MED ORDER — NITROGLYCERIN 0.4 MG SL SUBL
0.4000 mg | SUBLINGUAL_TABLET | Freq: Once | SUBLINGUAL | Status: DC
  Filled 2016-03-31: qty 1

## 2016-03-31 MED ORDER — VITAMIN D3 125 MCG (5000 UT) PO CAPS
5000.0000 [IU] | ORAL_CAPSULE | Freq: Every day | ORAL | Status: DC
  Filled 2016-03-31: qty 1

## 2016-03-31 MED ORDER — ASPIRIN 81 MG PO CHEW
324.0000 mg | CHEWABLE_TABLET | Freq: Once | ORAL | Status: DC
  Filled 2016-03-31: qty 4

## 2016-03-31 MED ORDER — MAGNESIUM OXIDE 400 MG PO CAPS
400.0000 mg | ORAL_CAPSULE | Freq: Two times a day (BID) | ORAL | Status: DC
  Administered 2016-03-31: 400 mg via ORAL
  Filled 2016-03-31 (×3): qty 1

## 2016-03-31 MED ORDER — HEPARIN SODIUM (PORCINE) 5000 UNIT/ML IJ SOLN
5000.0000 [IU] | Freq: Three times a day (TID) | INTRAMUSCULAR | Status: DC
  Administered 2016-03-31 – 2016-04-01 (×2): 5000 [IU] via SUBCUTANEOUS
  Filled 2016-03-31 (×2): qty 1

## 2016-03-31 MED ORDER — ACETAMINOPHEN 325 MG PO TABS: 650.0000 mg | ORAL_TABLET | ORAL | Status: DC | PRN

## 2016-03-31 MED ORDER — METOPROLOL TARTRATE 25 MG PO TABS: 25.0000 mg | ORAL_TABLET | Freq: Two times a day (BID) | ORAL | Status: DC

## 2016-03-31 MED ORDER — ALPRAZOLAM 0.25 MG PO TABS: 0.2500 mg | ORAL_TABLET | Freq: Two times a day (BID) | ORAL | Status: DC | PRN

## 2016-03-31 MED ORDER — COENZYME Q10 30 MG PO CAPS
300.0000 mg | ORAL_CAPSULE | Freq: Every day | ORAL | Status: DC
  Filled 2016-03-31: qty 10

## 2016-03-31 MED ORDER — RISAQUAD PO CAPS
ORAL_CAPSULE | Freq: Every day | ORAL | Status: DC
  Administered 2016-04-01: 1 via ORAL
  Filled 2016-03-31: qty 1

## 2016-03-31 MED ORDER — SODIUM CHLORIDE 0.9 % IV SOLN
INTRAVENOUS | Status: DC
  Administered 2016-03-31: via INTRAVENOUS

## 2016-03-31 MED ORDER — GEMFIBROZIL 600 MG PO TABS: 600.0000 mg | ORAL_TABLET | Freq: Two times a day (BID) | ORAL | Status: DC

## 2016-03-31 NOTE — ED Provider Notes (Signed)
[facility] Emergency Department Provider Note   ____________________________________________    I have reviewed the triage vital signs and the nursing notes.   HISTORY  Chief Complaint Chest Pain     HPI Sheena Rice is a 76 y.o. female who presents with complaints of chest pain. Patient reports she had a coronary artery bypass graft 3 months ago at [facility]. She has been doing well since then and today was in rehabilitation exercising and felt pain in her left arm and intermittent pain in her chest which felt like tightness. She had this several times today related to movement but not significant exertion necessarily. No fevers or chills. No shortness of breath. Currently she feels much better than she did on the way to the hospital. She took 4 baby aspirin at home.   Past Medical History:  Diagnosis Date  . Arthritis    OA  . Chronic fatigue   . Coronary artery disease   . GERD (gastroesophageal reflux disease)   . Hypertension   . Unstable angina [facility])     Patient Active Problem List   Diagnosis Date Noted  . S/P CABG x 4 01/04/2016  . Acute coronary syndrome ([place]) 12/30/2015  . Unstable angina ([place]) 12/29/2015    Past Surgical History:  Procedure Laterality Date  . APPENDECTOMY    . CARDIAC CATHETERIZATION Left 12/29/2015   Procedure: Left Heart Cath and Coronary Angiography;  Surgeon: Teodoro Spray, MD;  Location: [place] CV LAB;  Service: Cardiovascular;  Laterality: Left;  . CATARACT EXTRACTION    . CHOLECYSTECTOMY    . CORONARY ARTERY BYPASS GRAFT N/A 12/31/2015   Procedure: CORONARY ARTERY BYPASS GRAFTING (CABG), ON PUMP, TIMES FOUR, USING LEFT INTERNAL MAMMARY ARTERY, RIGHT GREATER SAPHENOUS VEIN HARVESTED ENDOSCOPICALLY;  Surgeon: [name], MD;  Location: [place];  Service: Open Heart Surgery;  Laterality: N/A;  -LIMA to LAD -SVG to DIAGONAL -SVG to OM -SVG to PDA  . TEE WITHOUT CARDIOVERSION N/A  12/31/2015   Procedure: TRANSESOPHAGEAL ECHOCARDIOGRAM (TEE);  Surgeon: [name], MD;  Location: [place];  Service: Open Heart Surgery;  Laterality: N/A;    Prior to Admission medications   Medication Sig Start Date End Date Taking? Authorizing Provider  acetaminophen (TYLENOL) 325 MG tablet Take 2 tablets (650 mg total) by mouth every 6 (six) hours as needed for mild pain or headache. Patient taking differently: Take 500 mg by mouth every 6 (six) hours as needed for mild pain or headache.  01/05/16   Erin R Barrett, PA-C  aspirin EC 81 MG EC tablet Take 1 tablet (81 mg total) by mouth daily. 12/30/15   Teodoro Spray, MD  Cholecalciferol (VITAMIN D3) 5000 units CAPS Take 5,000 Units by mouth daily.     Historical Provider, MD  colestipol (COLESTID) 1 g tablet Take 2 tablets by mouth 2 (two) times daily. 03/24/15   Historical Provider, MD  cyclobenzaprine (FLEXERIL) 10 MG tablet Take 10 mg by mouth at bedtime.  11/17/14   Historical Provider, MD  esomeprazole (NEXIUM) 20 MG capsule Take 20 mg by mouth every morning.     Historical Provider, MD  Flaxseed, Linseed, (FLAX SEED OIL PO) Take 1,400 mg by mouth daily.    Historical Provider, MD  gemfibrozil (LOPID) 600 MG tablet Take 600 mg by mouth 2 (two) times daily.  11/17/14   Historical Provider, MD  guaiFENesin (MUCINEX) 600 MG 12 hr tablet Take 1 tablet (600 mg  total) by mouth every 12 (twelve) hours as needed for cough. 01/05/16   Erin R Barrett, PA-C  Magnesium Oxide 400 MG CAPS Take 400 mg by mouth 2 (two) times daily.     Historical Provider, MD  metoprolol tartrate (LOPRESSOR) 25 MG tablet Take 1 tablet (25 mg total) by mouth 2 (two) times daily. 01/05/16   Erin R Barrett, PA-C  Neomycin-Bacitracin-Polymyxin (TRIPLE ANTIBIOTIC EX) Apply 1 application topically as needed.    Historical Provider, MD  Probiotic Product (PROBIOTIC DAILY) CAPS Take 1 capsule by mouth daily.    Historical Provider, MD     Allergies Pravastatin  No  family history on file.  Social History Social History  Substance Use Topics  . Smoking status: Never Smoker  . Smokeless tobacco: Never Used  . Alcohol use No    Review of Systems  Constitutional: No fever/chills Eyes: No visual changes.   Cardiovascular: As above Respiratory: Denies shortness of breath. Gastrointestinal: No abdominal pain.  No nausea, no vomiting.    Musculoskeletal: Negative for back pain.  Neurological: Negative for  weakness  10-point ROS otherwise negative.  ____________________________________________   PHYSICAL EXAM:  VITAL SIGNS: ED Triage Vitals  Enc Vitals Group     BP 03/31/16 2036 (!) 152/65     Pulse Rate 03/31/16 2036 61     Resp 03/31/16 2034 20     Temp 03/31/16 2034 98.6 F (37 C)     Temp Source 03/31/16 2034 Oral     SpO2 03/31/16 2036 100 %     Weight 03/31/16 2035 130 lb (59 kg)     Height 03/31/16 2035 5\' 4"  (1.626 m)     Head Circumference --      Peak Flow --      Pain Score 03/31/16 2036 4     Pain Loc --      Pain Edu? --      Excl. in [place]? --     Constitutional: Alert and oriented. No acute distress. Pleasant and interactive Eyes: Conjunctivae are normal.   Nose: No congestion/rhinnorhea. Mouth/Throat: Mucous membranes are moist.    Cardiovascular: Normal rate, regular rhythm. Grossly normal heart sounds.  Good peripheral circulation. Respiratory: Normal respiratory effort.  No retractions. Lungs CTAB. Gastrointestinal: Soft and nontender. No distention.  No CVA tenderness. Genitourinary: deferred Musculoskeletal: No lower extremity tenderness nor edema.  Warm and well perfused Neurologic:  Normal speech and language. No gross focal neurologic deficits are appreciated.  Skin:  Skin is warm, dry and intact. No rash noted. Psychiatric: Mood and affect are normal. Speech and behavior are normal.  ____________________________________________   LABS (all labs ordered are listed, but only abnormal results are  displayed)  Labs Reviewed  COMPREHENSIVE METABOLIC PANEL - Abnormal; Notable for the following:       Result Value   Glucose, Bld 124 (*)    BUN 22 (*)    All other components within normal limits  CBC  TROPONIN I   ____________________________________________  EKG  ED ECG REPORT I, Lavonia Drafts, the attending physician, personally viewed and interpreted this ECG.  Date: 03/31/2016 EKG Time: 8:33 PM Rate: 63 Rhythm: normal sinus rhythm QRS Axis: normal Intervals: normal ST/T Wave abnormalities: ST depressions inferiorly and laterally Conduction Disturbances: none   ____________________________________________  RADIOLOGY  Chest x-ray unremarkable ____________________________________________   PROCEDURES  Procedure(s) performed: No    Critical Care performed: No ____________________________________________   INITIAL IMPRESSION / ASSESSMENT AND PLAN / ED COURSE  Pertinent labs &  imaging results that were available during my care of the patient were reviewed by me and considered in my medical decision making (see chart for details).  Patient presents with chest pain, recent CABG. No chest pain currently. EKG is concerning given ST depressions. Pending cardiac enzymes, labs, chest x-ray.    _  ----------------------------------------- 9:37 PM on 03/31/2016 ----------------------------------------- Patient remains pain free. Lab work is reassuring. However given her history of recent CABG, concerning ekg and intermittent nonexertional chest pain today feel admission is best course of action.   ___________________________________________   FINAL CLINICAL IMPRESSION(S) / ED DIAGNOSES  Final diagnoses:  Chest pain, unspecified type      NEW MEDICATIONS STARTED DURING THIS VISIT:  New Prescriptions   No medications on file     Note:  This document was prepared using Dragon voice recognition software and may include unintentional dictation  errors.    Lavonia Drafts, MD 03/31/16 [phone]

## 2016-03-31 NOTE — ED Triage Notes (Addendum)
Pt to triage via wheelchair.  Pt reports chest pain that began this morning at cardiac rehab.  Pt states pain reoccurred tonight in the kitchen.  Pt states pain in both arms with tingling.    Intermittent sob.  Pt alert.   Speech clear.

## 2016-03-31 NOTE — Progress Notes (Signed)
Daily Session Note  Patient Details  Name: MIRRANDA MONRROY MRN: [id number] Date of Birth: [birth year] Referring Provider:     Cardiac Rehab from 02/15/2016 in [facility] Cardiac and Pulmonary Rehab  Referring Provider  Bartholome Bill MD      Encounter Date: 03/31/2016  Check In:     Session Check In - 03/31/16 1112      Check-In   Location [facility]-Cardiac & Pulmonary Rehab   Staff Present Nada Maclachlan, BA, ACSM CEP, Exercise Physiologist;Patricia Surles RN BSN  Darel Hong RN   Supervising physician immediately available to respond to emergencies See telemetry face sheet for immediately available ER MD   Medication changes reported     No   Fall or balance concerns reported    No   Warm-up and Cool-down Performed on first and last piece of equipment   Resistance Training Performed Yes   VAD Patient? No     Pain Assessment   Currently in Pain? No/denies         History  Smoking Status  . Never Smoker  Smokeless Tobacco  . Never Used    Goals Met:  Independence with eqiupment Strength training completed  Goals Unmet:  Not Applicable  Comments: See exercise comments.   Dr. Emily Filbert is Medical Director for [place] and [facility].

## 2016-03-31 NOTE — H&P (Signed)
[name] @ [facility] Admission History and Physical Harvie Bridge, D.O.  ---------------------------------------------------------------------------------------------------------------------   PATIENT NAME: Sheena Rice MR#: [id number] DATE OF BIRTH: 1940/11/29 DATE OF ADMISSION: 03/31/2016 PRIMARY CARE PHYSICIAN: Madelyn Brunner, MD  REQUESTING/REFERRING PHYSICIAN: ED Dr. Corky Downs  CHIEF COMPLAINT: Chief Complaint  Patient presents with  . Chest Pain    HISTORY OF PRESENT ILLNESS: Sheena Rice is a 76 y.o. female with a known history of OA, CAD s/p CABGx4 on 12/31/15, GERD, HTN, HLD, chronic fatigue, osteoporosis,  UA presents to the emergency department for evaluation of chest pain.  Patient was in a usual state of health until this morning when she developed chest pain during cardiac rehab.  Pain States that she was using an arm bike when she reports the sudden onset of described as tightness in her chest and left arm. She states the pain radiated down her arm and involved her entire left arm..  Patient took 4 ASA 81mg  prior to coming to ED and pain has since resolved.  She states she has been doing fairly well in cardiac rehabilitation and has been using this particular machine for the past 3 months without any problems. She reports the only other time she had this pain in her arm was when she was lifting something onto a high shelf.  Otherwise there has been no change in status. Patient has been taking medication as prescribed and there has been no recent change in medication or diet.  There has been no recent illness, travel or sick contacts.    Patient denies fevers/chills, weakness, dizziness, shortness of breath, N/V/C/D, abdominal pain, dysuria/frequency, changes in mental status.    PAST MEDICAL HISTORY: Past Medical History:  Diagnosis Date  . Arthritis    OA  . Chronic fatigue   . Coronary artery disease   . GERD (gastroesophageal reflux disease)   .  Hypertension   . Unstable angina ([place])       PAST SURGICAL HISTORY: Past Surgical History:  Procedure Laterality Date  . APPENDECTOMY    . CARDIAC CATHETERIZATION Left 12/29/2015   Procedure: Left Heart Cath and Coronary Angiography;  Surgeon: Teodoro Spray, MD;  Location: [place] CV [facility];  Service: Cardiovascular;  Laterality: Left;  . CATARACT EXTRACTION    . CHOLECYSTECTOMY    . CORONARY ARTERY BYPASS GRAFT N/A 12/31/2015   Procedure: CORONARY ARTERY BYPASS GRAFTING (CABG), ON PUMP, TIMES FOUR, USING LEFT INTERNAL MAMMARY ARTERY, RIGHT GREATER SAPHENOUS VEIN HARVESTED ENDOSCOPICALLY;  Surgeon: [name], MD;  Location: [place];  Service: Open Heart Surgery;  Laterality: N/A;  -LIMA to LAD -SVG to DIAGONAL -SVG to OM -SVG to PDA  . TEE WITHOUT CARDIOVERSION N/A 12/31/2015   Procedure: TRANSESOPHAGEAL ECHOCARDIOGRAM (TEE);  Surgeon: [name], MD;  Location: [place];  Service: Open Heart Surgery;  Laterality: N/A;      SOCIAL HISTORY: Social History  Substance Use Topics  . Smoking status: Never Smoker  . Smokeless tobacco: Never Used  . Alcohol use No      FAMILY HISTORY: Family History   Medical History Relation Name Comments  Coronary Artery Disease (Blocked arteries around heart) Brother  s/p CABG  Arthritis Father  rheumatoid  Coronary Artery Disease (Blocked arteries around heart) Sister  S/P stent  Colon cancer Sister    Coronary Artery Disease (Blocked arteries around heart) Sister       MEDICATIONS AT HOME: Prior to Admission medications   Medication Sig Start Date End  Date Taking? Authorizing Provider  acetaminophen (TYLENOL) 325 MG tablet Take 2 tablets (650 mg total) by mouth every 6 (six) hours as needed for mild pain or headache. Patient taking differently: Take 500 mg by mouth every 6 (six) hours as needed for mild pain or headache.  01/05/16   Erin R Barrett, PA-C  aspirin EC 81 MG EC tablet Take 1 tablet (81 mg total) by  mouth daily. 12/30/15   Teodoro Spray, MD  Cholecalciferol (VITAMIN D3) 5000 units CAPS Take 5,000 Units by mouth daily.     Historical Provider, MD  colestipol (COLESTID) 1 g tablet Take 2 tablets by mouth 2 (two) times daily. 03/24/15   Historical Provider, MD  cyclobenzaprine (FLEXERIL) 10 MG tablet Take 10 mg by mouth at bedtime.  11/17/14   Historical Provider, MD  esomeprazole (NEXIUM) 20 MG capsule Take 20 mg by mouth every morning.     Historical Provider, MD  Flaxseed, Linseed, (FLAX SEED OIL PO) Take 1,400 mg by mouth daily.    Historical Provider, MD  gemfibrozil (LOPID) 600 MG tablet Take 600 mg by mouth 2 (two) times daily.  11/17/14   Historical Provider, MD  guaiFENesin (MUCINEX) 600 MG 12 hr tablet Take 1 tablet (600 mg total) by mouth every 12 (twelve) hours as needed for cough. 01/05/16   Erin R Barrett, PA-C  Magnesium Oxide 400 MG CAPS Take 400 mg by mouth 2 (two) times daily.     Historical Provider, MD  metoprolol tartrate (LOPRESSOR) 25 MG tablet Take 1 tablet (25 mg total) by mouth 2 (two) times daily. 01/05/16   Erin R Barrett, PA-C  Neomycin-Bacitracin-Polymyxin (TRIPLE ANTIBIOTIC EX) Apply 1 application topically as needed.    Historical Provider, MD  Probiotic Product (PROBIOTIC DAILY) CAPS Take 1 capsule by mouth daily.    Historical Provider, MD      DRUG ALLERGIES: Allergies  Allergen Reactions  . Pravastatin Other (See Comments)    Muscle pain "STATIN" Drugs     REVIEW OF SYSTEMS: CONSTITUTIONAL: No fatigue, weakness, fever, chills, weight gain/loss, headache EYES: No blurry or double vision. ENT: No tinnitus, postnasal drip, redness or soreness of the oropharynx. RESPIRATORY: No dyspnea, cough, wheeze, hemoptysis. CARDIOVASCULAR: Positive chest pain, negative orthopnea, palpitations, syncope. GASTROINTESTINAL: No nausea, vomiting, constipation, diarrhea, abdominal pain. No hematemesis, melena or hematochezia. GENITOURINARY: No dysuria, frequency,  hematuria. ENDOCRINE: No polyuria or nocturia. No heat or cold intolerance. HEMATOLOGY: No anemia, bruising, bleeding. INTEGUMENTARY: No rashes, ulcers, lesions. MUSCULOSKELETAL: No pain, arthritis, swelling, gout.Positive left arm pain NEUROLOGIC: No numbness, tingling, weakness or ataxia. No seizure-type activity. PSYCHIATRIC: No anxiety, depression, insomnia.  PHYSICAL EXAMINATION: VITAL SIGNS: Blood pressure (!) 152/65, pulse (!) 56, temperature 98.6 F (37 C), temperature source Oral, resp. rate 17, height 5\' 4"  (1.626 m), weight 59 kg (130 lb), SpO2 98 %.  GENERAL: 76 y.o.-year-old female patient, well-developed, well-nourished lying in the bed in no acute distress.  Pleasant and cooperative.   HEENT: Head atraumatic, normocephalic. Pupils equal, round, reactive to light and accommodation. No scleral icterus. Extraocular muscles intact. Oropharynx is clear. Mucus membranes moist. NECK: Supple, full range of motion. No JVD, no bruit heard. No cervical lymphadenopathy. CHEST: Normal breath sounds bilaterally. No wheezing, rales, rhonchi or crackles. No use of accessory muscles of respiration.  No reproducible chest wall tenderness.  CARDIOVASCULAR: S1, S2 normal. No murmurs, rubs, or gallops appreciated. Cap refill <2 seconds. ABDOMEN: Soft, nontender, nondistended. No rebound, guarding, rigidity. Normoactive bowel sounds present in all four  quadrants. No organomegaly or mass. EXTREMITIES: Full range of motion. No pedal edema, cyanosis, or clubbing. No reproducible left arm pain or neck pain. No radicular signs NEUROLOGIC: Cranial nerves II through XII are grossly intact with no focal sensorimotor deficit. Muscle strength 5/5 in all extremities. Sensation intact. Gait not checked. PSYCHIATRIC: The patient is alert and oriented x 3. Normal affect, mood, thought content. SKIN: Warm, dry, and intact without obvious rash, lesion, or ulcer.  LABORATORY PANEL:  CBC  Recent Labs Lab  03/31/16 2050  WBC 5.4  HGB 13.4  HCT 38.8  PLT 209   ----------------------------------------------------------------------------------------------------------------- Chemistries  Recent Labs Lab 03/31/16 2050  NA 137  K 4.0  CL 103  CO2 24  GLUCOSE 124*  BUN 22*  CREATININE 0.61  CALCIUM 9.8  AST 32  ALT 18  ALKPHOS 90  BILITOT 0.5   ------------------------------------------------------------------------------------------------------------------ Cardiac Enzymes  Recent Labs Lab 03/31/16 2050  TROPONINI <0.03   ------------------------------------------------------------------------------------------------------------------  RADIOLOGY: Dg Chest 1 View  Result Date: 03/31/2016 CLINICAL DATA:  Chest pain EXAM: CHEST 1 VIEW COMPARISON:  02/02/2016 FINDINGS: Previous median sternotomy and CABG procedure. The heart size and mediastinal contours are within normal limits. Both lungs are clear. The visualized skeletal structures are unremarkable. IMPRESSION: No active disease. Electronically Signed   By: Kerby Moors M.D.   On: 03/31/2016 21:13    EKG: Normal sinus rhythm at 63 bpm with normal axis, inferior and lateral ST depressions II, III, avF, V4-6 (new) and nonspecific ST-T wave changes.   IMPRESSION AND PLAN:  This is a 76 y.o. female with a history of OA, CAD s/p CABGx4 on 12/31/15, GERD, HTN, HLD, chronic fatigue, osteoporosis, UA now being admitted with:  1. Atypical chest pain, rule out ACS - Admit to observation with telemetry monitoring. - Trend troponins, check lipids and TSH. - Morphine, nitro ordered.   - Continue metoprolol, aspirin.  Statin allergic.  - Cardiology consult requested. Follows with Dr. Ubaldo Glassing.   2. History of HTN - Continue Lopressor  3. History of GERD - Continue Nexium  4. History of HLD - Continue Lopid  Admission status: Observation, telemetry Diet/Nutrition: Heart healthy Fluids: HL DVT Px: Heparin, SCDs and early  ambulation Code Status: Full Disposition Planning: To home in <24 hours  All the records are reviewed and case discussed with ED provider. Management plans discussed with the patient and/or family who express understanding and agree with plan of care.   TOTAL TIME TAKING CARE OF THIS PATIENT: 60 minutes.   Tirso Laws D.O. on 03/31/2016 at 10:01 PM Between 7am to 6pm - Pager - [phone] After 6pm go to [email or web address] - [employer] [facility] [place] [facility] Office [phone] CC: Primary care physician; Madelyn Brunner, MD     Note: This dictation was prepared with Dragon dictation along with smaller phrase technology. Any transcriptional errors that result from this process are unintentional.

## 2016-04-01 ENCOUNTER — Encounter (HOSPITAL_COMMUNITY): Payer: Self-pay

## 2016-04-01 ENCOUNTER — Encounter
Admission: EM | Disposition: A | Discharge status: Other Acute Inpatient Hospital | Payer: Self-pay | Source: Home / Self Care | Attending: Emergency Medicine

## 2016-04-01 ENCOUNTER — Inpatient Hospital Stay (HOSPITAL_COMMUNITY)
Admission: AD | Admit: 2016-04-01 | Discharge: 2016-04-04 | DRG: 247 | Disposition: A | Discharge status: Home / Self Care | Payer: Medicare HMO | Source: Other Acute Inpatient Hospital | Attending: Internal Medicine | Admitting: Internal Medicine

## 2016-04-01 DIAGNOSIS — I200 Unstable angina: Secondary | ICD-10-CM | POA: Diagnosis present

## 2016-04-01 DIAGNOSIS — Z8249 Family history of ischemic heart disease and other diseases of the circulatory system: Secondary | ICD-10-CM | POA: Diagnosis not present

## 2016-04-01 DIAGNOSIS — Z9889 Other specified postprocedural states: Secondary | ICD-10-CM | POA: Diagnosis not present

## 2016-04-01 DIAGNOSIS — I340 Nonrheumatic mitral (valve) insufficiency: Secondary | ICD-10-CM | POA: Diagnosis not present

## 2016-04-01 DIAGNOSIS — I25710 Atherosclerosis of autologous vein coronary artery bypass graft(s) with unstable angina pectoris: Secondary | ICD-10-CM | POA: Diagnosis not present

## 2016-04-01 DIAGNOSIS — Z79899 Other long term (current) drug therapy: Secondary | ICD-10-CM

## 2016-04-01 DIAGNOSIS — R072 Precordial pain: Secondary | ICD-10-CM | POA: Diagnosis not present

## 2016-04-01 DIAGNOSIS — Z9049 Acquired absence of other specified parts of digestive tract: Secondary | ICD-10-CM | POA: Diagnosis not present

## 2016-04-01 DIAGNOSIS — R5382 Chronic fatigue, unspecified: Secondary | ICD-10-CM | POA: Diagnosis not present

## 2016-04-01 DIAGNOSIS — Z7982 Long term (current) use of aspirin: Secondary | ICD-10-CM

## 2016-04-01 DIAGNOSIS — I25110 Atherosclerotic heart disease of native coronary artery with unstable angina pectoris: Secondary | ICD-10-CM

## 2016-04-01 DIAGNOSIS — Z888 Allergy status to other drugs, medicaments and biological substances status: Secondary | ICD-10-CM | POA: Diagnosis not present

## 2016-04-01 DIAGNOSIS — M1990 Unspecified osteoarthritis, unspecified site: Secondary | ICD-10-CM | POA: Diagnosis present

## 2016-04-01 DIAGNOSIS — E785 Hyperlipidemia, unspecified: Secondary | ICD-10-CM | POA: Diagnosis not present

## 2016-04-01 DIAGNOSIS — Q250 Patent ductus arteriosus: Secondary | ICD-10-CM | POA: Diagnosis not present

## 2016-04-01 DIAGNOSIS — I214 Non-ST elevation (NSTEMI) myocardial infarction: Secondary | ICD-10-CM | POA: Diagnosis not present

## 2016-04-01 DIAGNOSIS — I10 Essential (primary) hypertension: Secondary | ICD-10-CM | POA: Diagnosis present

## 2016-04-01 DIAGNOSIS — I252 Old myocardial infarction: Secondary | ICD-10-CM

## 2016-04-01 DIAGNOSIS — I2582 Chronic total occlusion of coronary artery: Secondary | ICD-10-CM | POA: Diagnosis not present

## 2016-04-01 DIAGNOSIS — M791 Myalgia: Secondary | ICD-10-CM | POA: Diagnosis present

## 2016-04-01 DIAGNOSIS — T82857A Stenosis of cardiac prosthetic devices, implants and grafts, initial encounter: Secondary | ICD-10-CM | POA: Diagnosis present

## 2016-04-01 DIAGNOSIS — I25810 Atherosclerosis of coronary artery bypass graft(s) without angina pectoris: Secondary | ICD-10-CM | POA: Diagnosis present

## 2016-04-01 DIAGNOSIS — I2510 Atherosclerotic heart disease of native coronary artery without angina pectoris: Secondary | ICD-10-CM | POA: Diagnosis not present

## 2016-04-01 DIAGNOSIS — K219 Gastro-esophageal reflux disease without esophagitis: Secondary | ICD-10-CM | POA: Diagnosis present

## 2016-04-01 DIAGNOSIS — R079 Chest pain, unspecified: Secondary | ICD-10-CM | POA: Diagnosis not present

## 2016-04-01 HISTORY — PX: CORONARY STENT INTERVENTION: CATH118234

## 2016-04-01 HISTORY — PX: LEFT HEART CATH AND CORS/GRAFTS ANGIOGRAPHY: CATH118250

## 2016-04-01 LAB — CBC WITH DIFFERENTIAL/PLATELET
Basophils Absolute: 0 10*3/uL (ref 0.0–0.1)
Basophils Relative: 0 %
Eosinophils Absolute: 0 10*3/uL (ref 0.0–0.7)
Eosinophils Relative: 0 %
HCT: 39.2 % (ref 36.0–46.0)
Hemoglobin: 13.3 g/dL (ref 12.0–15.0)
Lymphocytes Relative: 20 %
Lymphs Abs: 1.2 10*3/uL (ref 0.7–4.0)
MCH: 30.4 pg (ref 26.0–34.0)
MCHC: 33.9 g/dL (ref 30.0–36.0)
MCV: 89.5 fL (ref 78.0–100.0)
Monocytes Absolute: 0.5 10*3/uL (ref 0.1–1.0)
Monocytes Relative: 9 %
Neutro Abs: 4.3 10*3/uL (ref 1.7–7.7)
Neutrophils Relative %: 71 %
Platelets: 204 10*3/uL (ref 150–400)
RBC: 4.38 MIL/uL (ref 3.87–5.11)
RDW: 13.1 % (ref 11.5–15.5)
WBC: 6.1 10*3/uL (ref 4.0–10.5)

## 2016-04-01 LAB — PROTIME-INR
INR: 1.03
Prothrombin Time: 13.5 seconds (ref 11.4–15.2)

## 2016-04-01 LAB — TROPONIN I
TROPONIN I: 0.03 ng/mL — AB (ref <0.03)
TROPONIN I: 0.03 ng/mL — AB (ref <0.03)

## 2016-04-01 LAB — APTT: aPTT: 35 seconds (ref 24–36)

## 2016-04-01 SURGERY — LEFT HEART CATH AND CORS/GRAFTS ANGIOGRAPHY
Anesthesia: Moderate Sedation

## 2016-04-01 MED ORDER — SODIUM CHLORIDE 0.9% FLUSH: 3.0000 mL | INTRAVENOUS | Status: DC | PRN

## 2016-04-01 MED ORDER — SODIUM CHLORIDE 0.9 % IV SOLN
INTRAVENOUS | Status: DC | PRN
  Administered 2016-04-01: 1.75 mg/kg/h via INTRAVENOUS

## 2016-04-01 MED ORDER — SODIUM CHLORIDE 0.9 % WEIGHT BASED INFUSION
1.0000 mL/kg/h | INTRAVENOUS | Status: DC
  Administered 2016-04-01: 1 mL/kg/h via INTRAVENOUS

## 2016-04-01 MED ORDER — BIVALIRUDIN BOLUS VIA INFUSION - CUPID
INTRAVENOUS | Status: DC | PRN
  Administered 2016-04-01: 45.225 mg via INTRAVENOUS

## 2016-04-01 MED ORDER — ONDANSETRON HCL 4 MG/2ML IJ SOLN
4.0000 mg | Freq: Four times a day (QID) | INTRAMUSCULAR | Status: DC | PRN
  Administered 2016-04-02 (×2): 4 mg via INTRAVENOUS
  Filled 2016-04-01 (×2): qty 2

## 2016-04-01 MED ORDER — HEPARIN (PORCINE) IN NACL 2-0.9 UNIT/ML-% IJ SOLN
INTRAMUSCULAR | Status: AC
  Filled 2016-04-01: qty 500

## 2016-04-01 MED ORDER — COENZYME Q10 30 MG PO CAPS: 300.0000 mg | ORAL_CAPSULE | Freq: Every day | ORAL | Status: DC

## 2016-04-01 MED ORDER — BIVALIRUDIN 250 MG IV SOLR
INTRAVENOUS | Status: AC
  Filled 2016-04-01: qty 250

## 2016-04-01 MED ORDER — LABETALOL HCL 5 MG/ML IV SOLN
INTRAVENOUS | Status: AC
  Filled 2016-04-01: qty 4

## 2016-04-01 MED ORDER — ASPIRIN 81 MG PO CHEW: 81.0000 mg | CHEWABLE_TABLET | ORAL | Status: DC

## 2016-04-01 MED ORDER — NITROGLYCERIN 0.4 MG SL SUBL
0.4000 mg | SUBLINGUAL_TABLET | SUBLINGUAL | Status: DC | PRN
  Administered 2016-04-02 (×3): 0.4 mg via SUBLINGUAL
  Filled 2016-04-01 (×3): qty 1

## 2016-04-01 MED ORDER — COLESTIPOL HCL 1 G PO TABS
2.0000 g | ORAL_TABLET | Freq: Two times a day (BID) | ORAL | Status: DC
  Administered 2016-04-02 – 2016-04-04 (×5): 2 g via ORAL
  Filled 2016-04-01 (×7): qty 2

## 2016-04-01 MED ORDER — ASPIRIN 81 MG PO CHEW
CHEWABLE_TABLET | ORAL | Status: DC | PRN
  Administered 2016-04-01: 243 mg via ORAL

## 2016-04-01 MED ORDER — NITROGLYCERIN IN D5W 200-5 MCG/ML-% IV SOLN
INTRAVENOUS | Status: AC
  Filled 2016-04-01: qty 250

## 2016-04-01 MED ORDER — ASPIRIN EC 81 MG PO TBEC
81.0000 mg | DELAYED_RELEASE_TABLET | Freq: Every day | ORAL | Status: DC
  Administered 2016-04-02 – 2016-04-04 (×3): 81 mg via ORAL
  Filled 2016-04-01 (×3): qty 1

## 2016-04-01 MED ORDER — ACETAMINOPHEN 325 MG PO TABS
650.0000 mg | ORAL_TABLET | ORAL | Status: DC | PRN
  Administered 2016-04-02 (×3): 650 mg via ORAL
  Filled 2016-04-01 (×3): qty 2

## 2016-04-01 MED ORDER — METOPROLOL TARTRATE 25 MG PO TABS
25.0000 mg | ORAL_TABLET | Freq: Two times a day (BID) | ORAL | Status: DC
  Administered 2016-04-02 – 2016-04-04 (×6): 25 mg via ORAL
  Filled 2016-04-01 (×6): qty 1

## 2016-04-01 MED ORDER — ASPIRIN 81 MG PO CHEW
CHEWABLE_TABLET | ORAL | Status: AC
  Filled 2016-04-01: qty 3

## 2016-04-01 MED ORDER — HEPARIN (PORCINE) IN NACL 100-0.45 UNIT/ML-% IJ SOLN
INTRAMUSCULAR | Status: DC | PRN
  Administered 2016-04-01: 15 [IU]/kg/h via INTRAVENOUS

## 2016-04-01 MED ORDER — LABETALOL HCL 5 MG/ML IV SOLN
INTRAVENOUS | Status: DC | PRN
  Administered 2016-04-01 (×2): 10 mg via INTRAVENOUS

## 2016-04-01 MED ORDER — HEPARIN (PORCINE) IN NACL 100-0.45 UNIT/ML-% IJ SOLN
750.0000 [IU]/h | INTRAMUSCULAR | Status: DC
  Administered 2016-04-02: 750 [IU]/h via INTRAVENOUS
  Filled 2016-04-01: qty 250

## 2016-04-01 MED ORDER — NITROGLYCERIN 0.4 MG SL SUBL
SUBLINGUAL_TABLET | SUBLINGUAL | Status: DC | PRN
Start: 2016-04-01 — End: 2016-04-01
  Administered 2016-04-01: .4 mg via SUBLINGUAL

## 2016-04-01 MED ORDER — SODIUM CHLORIDE 0.9 % WEIGHT BASED INFUSION
3.0000 mL/kg/h | INTRAVENOUS | Status: DC
  Administered 2016-04-01: 3 mL/kg/h via INTRAVENOUS

## 2016-04-01 MED ORDER — MIDAZOLAM HCL 2 MG/2ML IJ SOLN
INTRAMUSCULAR | Status: AC
  Filled 2016-04-01: qty 2

## 2016-04-01 MED ORDER — TICAGRELOR 90 MG PO TABS
ORAL_TABLET | ORAL | Status: DC | PRN
Start: 2016-04-01 — End: 2016-04-01
  Administered 2016-04-01: 180 mg via ORAL

## 2016-04-01 MED ORDER — NITROGLYCERIN 0.4 MG SL SUBL
SUBLINGUAL_TABLET | SUBLINGUAL | Status: AC
  Filled 2016-04-01: qty 1

## 2016-04-01 MED ORDER — TICAGRELOR 90 MG PO TABS: 90.0000 mg | ORAL_TABLET | Freq: Two times a day (BID) | ORAL | Status: DC

## 2016-04-01 MED ORDER — SODIUM CHLORIDE 0.9 % IV SOLN: 250.0000 mL | INTRAVENOUS | Status: DC | PRN

## 2016-04-01 MED ORDER — MAGNESIUM OXIDE 400 (241.3 MG) MG PO TABS: 400.0000 mg | ORAL_TABLET | Freq: Two times a day (BID) | ORAL | Status: DC

## 2016-04-01 MED ORDER — ASPIRIN 81 MG PO CHEW: 81.0000 mg | CHEWABLE_TABLET | Freq: Every day | ORAL | Status: DC

## 2016-04-01 MED ORDER — VITAMIN D 1000 UNITS PO TABS
5000.0000 [IU] | ORAL_TABLET | Freq: Every day | ORAL | Status: DC
  Administered 2016-04-03 – 2016-04-04 (×2): 5000 [IU] via ORAL
  Filled 2016-04-01 (×3): qty 5

## 2016-04-01 MED ORDER — SODIUM CHLORIDE 0.9% FLUSH
3.0000 mL | Freq: Two times a day (BID) | INTRAVENOUS | Status: DC
Start: 2016-04-02 — End: 2016-04-01

## 2016-04-01 MED ORDER — CYCLOBENZAPRINE HCL 10 MG PO TABS
10.0000 mg | ORAL_TABLET | Freq: Every day | ORAL | Status: DC
  Administered 2016-04-02 – 2016-04-03 (×3): 10 mg via ORAL
  Filled 2016-04-01 (×3): qty 1

## 2016-04-01 MED ORDER — MIDAZOLAM HCL 2 MG/2ML IJ SOLN
INTRAMUSCULAR | Status: DC | PRN
Start: 2016-04-01 — End: 2016-04-01
  Administered 2016-04-01 (×2): 1 mg via INTRAVENOUS

## 2016-04-01 MED ORDER — ONDANSETRON HCL 4 MG/2ML IJ SOLN: 4.0000 mg | Freq: Four times a day (QID) | INTRAMUSCULAR | Status: DC | PRN

## 2016-04-01 MED ORDER — FENTANYL CITRATE (PF) 100 MCG/2ML IJ SOLN
INTRAMUSCULAR | Status: AC
  Filled 2016-04-01: qty 2

## 2016-04-01 MED ORDER — ACETAMINOPHEN 325 MG PO TABS: 650.0000 mg | ORAL_TABLET | ORAL | Status: DC | PRN

## 2016-04-01 MED ORDER — FENTANYL CITRATE (PF) 100 MCG/2ML IJ SOLN
INTRAMUSCULAR | Status: DC | PRN
  Administered 2016-04-01 (×2): 25 ug via INTRAVENOUS

## 2016-04-01 MED ORDER — LABETALOL HCL 5 MG/ML IV SOLN: 10.0000 mg | INTRAVENOUS | Status: DC | PRN

## 2016-04-01 MED ORDER — FLAX SEED OIL 1300 MG PO CAPS: 1400.0000 mg | ORAL_CAPSULE | Freq: Every day | ORAL | Status: DC

## 2016-04-01 MED ORDER — HEPARIN (PORCINE) IN NACL 100-0.45 UNIT/ML-% IJ SOLN
INTRAMUSCULAR | Status: AC
  Filled 2016-04-01: qty 250

## 2016-04-01 MED ORDER — HEPARIN (PORCINE) IN NACL 100-0.45 UNIT/ML-% IJ SOLN: 750.0000 [IU]/h | INTRAMUSCULAR | Status: DC

## 2016-04-01 MED ORDER — PANTOPRAZOLE SODIUM 40 MG PO TBEC
40.0000 mg | DELAYED_RELEASE_TABLET | Freq: Every day | ORAL | Status: DC
  Administered 2016-04-02 – 2016-04-04 (×3): 40 mg via ORAL
  Filled 2016-04-01 (×3): qty 1

## 2016-04-01 MED ORDER — RISAQUAD PO CAPS
1.0000 | ORAL_CAPSULE | Freq: Every day | ORAL | Status: DC
  Administered 2016-04-02 – 2016-04-04 (×3): 1 via ORAL
  Filled 2016-04-01 (×3): qty 1

## 2016-04-01 MED ORDER — IOPAMIDOL (ISOVUE-300) INJECTION 61%
INTRAVENOUS | Status: DC | PRN
  Administered 2016-04-01: 360 mL via INTRA_ARTERIAL

## 2016-04-01 MED ORDER — SODIUM CHLORIDE 0.9% FLUSH
3.0000 mL | Freq: Two times a day (BID) | INTRAVENOUS | Status: DC
  Administered 2016-04-01: 3 mL via INTRAVENOUS

## 2016-04-01 MED ORDER — NITROGLYCERIN 5 MG/ML IV SOLN
INTRAVENOUS | Status: AC
  Filled 2016-04-01: qty 10

## 2016-04-01 MED ORDER — SODIUM CHLORIDE 0.9 % WEIGHT BASED INFUSION: 1.0000 mL/kg/h | INTRAVENOUS | Status: DC

## 2016-04-01 MED ORDER — GEMFIBROZIL 600 MG PO TABS
600.0000 mg | ORAL_TABLET | Freq: Two times a day (BID) | ORAL | Status: DC
  Administered 2016-04-02 – 2016-04-04 (×5): 600 mg via ORAL
  Filled 2016-04-01 (×6): qty 1

## 2016-04-01 MED ORDER — HYDRALAZINE HCL 20 MG/ML IJ SOLN: 5.0000 mg | INTRAMUSCULAR | Status: DC | PRN

## 2016-04-01 MED ORDER — TICAGRELOR 90 MG PO TABS
ORAL_TABLET | ORAL | Status: AC
  Filled 2016-04-01: qty 2

## 2016-04-01 MED ORDER — LIDOCAINE HCL (PF) 1 % IJ SOLN
INTRAMUSCULAR | Status: AC
  Filled 2016-04-01: qty 30

## 2016-04-01 MED FILL — Magnesium Oxide Tab 400 MG (241.3 MG Elemental Mg): ORAL | Qty: 1 | Status: AC

## 2016-04-01 SURGICAL SUPPLY — 21 items
BALLN TREK RX 2.25X15 (BALLOONS) ×3
BALLOON TREK RX 2.25X15 (BALLOONS) IMPLANT
CATH 5FR JR4 DIAGNOSTIC (CATHETERS) ×2 IMPLANT
CATH 5FR PIGTAIL DIAGNOSTIC (CATHETERS) ×1 IMPLANT
CATH INFINITI 5 FR RCB (CATHETERS) ×2 IMPLANT
CATH INFINITI 5FR JL4 (CATHETERS) ×2 IMPLANT
CATH INFINITI JR4 5F (CATHETERS) ×2 IMPLANT
CATH VISTA GUIDE 6FR IM 90 CM (CATHETERS) ×2 IMPLANT
CATH VISTA GUIDE 6FR JR3.5 SH (CATHETERS) ×2 IMPLANT
CATH VISTA GUIDE 6FR JR4 SH (CATHETERS) ×2 IMPLANT
CATH VISTA GUIDE 6FR MPA1 (CATHETERS) ×2 IMPLANT
DEVICE CLOSURE MYNXGRIP 6/7F (Vascular Products) ×2 IMPLANT
DEVICE INFLAT 30 PLUS (MISCELLANEOUS) ×2 IMPLANT
KIT MANI 3VAL PERCEP (MISCELLANEOUS) ×3 IMPLANT
NDL PERC 18GX7CM (NEEDLE) IMPLANT
NEEDLE PERC 18GX7CM (NEEDLE) ×3 IMPLANT
PACK CARDIAC CATH (CUSTOM PROCEDURE TRAY) ×3 IMPLANT
SHEATH AVANTI 5FR X 11CM (SHEATH) ×2 IMPLANT
SHEATH PINNACLE 6F 10CM (SHEATH) ×2 IMPLANT
WIRE EMERALD 3MM-J .035X150CM (WIRE) ×2 IMPLANT
WIRE G HI TQ BMW 190 (WIRE) ×4 IMPLANT

## 2016-04-01 NOTE — Progress Notes (Signed)
Discharged with care-link. Left via stretcher on RA with IV fluids infusing.

## 2016-04-01 NOTE — Progress Notes (Signed)
Admitted from home through the ED with chest pain. No pain now. IV fluids infusing. Slept well through the night. Independent, A&O.

## 2016-04-01 NOTE — Consult Note (Signed)
Reason for Consult: Unstable angina coronary disease Referring Physician: Dr. Ina Homes hospitalist Dr. Gilford Rile III primary Cardiologist Dr. Malva Cogan Sheena Rice is an 76 y.o. female.  HPI: Patient has history of coronary disease coronary bypass surgery 4 12/31/2015 at [facility] for multivessel disease GERD hypertension hyperlipidemia chronic fatigue osteoporosis was undergoing cardiac rehabilitation. Over the last 2 weeks has developed recurrent persistent chest pain and anginal symptoms. Progressed to symptoms with ordinary activity dyspnea radiation of pain to back and left arm. Patient has been on aspirin therapy has not taken much improved with nitrates. Denies any blackout spells syncope no palpitations tachycardia but the worsening anginal symptoms prompted her to come to the emergency room for further assessment. Patient has also had symptoms consistent with reflux as well states to be unable to tolerate statin she is on colestipol. Patient has been treated with beta blocker aspirin morphine as needed for pain.  Past Medical History:  Diagnosis Date  . Arthritis    OA  . Chronic fatigue   . Coronary artery disease   . GERD (gastroesophageal reflux disease)   . Hypertension   . Unstable angina [facility])     Past Surgical History:  Procedure Laterality Date  . APPENDECTOMY    . CARDIAC CATHETERIZATION Left 12/29/2015   Procedure: Left Heart Cath and Coronary Angiography;  Surgeon: Teodoro Spray, MD;  Location: [place] CV [facility];  Service: Cardiovascular;  Laterality: Left;  . CATARACT EXTRACTION    . CHOLECYSTECTOMY    . CORONARY ARTERY BYPASS GRAFT N/A 12/31/2015   Procedure: CORONARY ARTERY BYPASS GRAFTING (CABG), ON PUMP, TIMES FOUR, USING LEFT INTERNAL MAMMARY ARTERY, RIGHT GREATER SAPHENOUS VEIN HARVESTED ENDOSCOPICALLY;  Surgeon: [name], MD;  Location: [place];  Service: Open Heart Surgery;  Laterality: N/A;  -LIMA to LAD -SVG to DIAGONAL -SVG to OM -SVG to PDA  .  TEE WITHOUT CARDIOVERSION N/A 12/31/2015   Procedure: TRANSESOPHAGEAL ECHOCARDIOGRAM (TEE);  Surgeon: [name], MD;  Location: [place];  Service: Open Heart Surgery;  Laterality: N/A;    No family history on file.  Social History:  reports that she has never smoked. She has never used smokeless tobacco. She reports that she does not drink alcohol or use drugs.  Allergies:  Allergies  Allergen Reactions  . Pravastatin Other (See Comments)    Muscle pain "STATIN" Drugs    Medications: I have reviewed the patient's current medications.  Results for orders placed or performed during the hospital encounter of 03/31/16 (from the past 48 hour(s))  CBC     Status: None   Collection Time: 03/31/16  8:50 PM  Result Value Ref Range   WBC 5.4 3.6 - 11.0 K/uL   RBC 4.46 3.80 - 5.20 MIL/uL   Hemoglobin 13.4 12.0 - 16.0 g/dL   HCT 38.8 35.0 - 47.0 %   MCV 87.0 80.0 - 100.0 fL   MCH 30.1 26.0 - 34.0 pg   MCHC 34.6 32.0 - 36.0 g/dL   RDW 13.3 11.5 - 14.5 %   Platelets 209 150 - 440 K/uL  Comprehensive metabolic panel     Status: Abnormal   Collection Time: 03/31/16  8:50 PM  Result Value Ref Range   Sodium 137 135 - 145 mmol/L   Potassium 4.0 3.5 - 5.1 mmol/L   Chloride 103 101 - 111 mmol/L   CO2 24 22 - 32 mmol/L   Glucose, Bld 124 (H) 65 - 99 mg/dL   BUN 22 (H) 6 -  20 mg/dL   Creatinine, Ser 0.61 0.44 - 1.00 mg/dL   Calcium 9.8 8.9 - 10.3 mg/dL   Total Protein 7.6 6.5 - 8.1 g/dL   Albumin 3.9 3.5 - 5.0 g/dL   AST 32 15 - 41 U/L   ALT 18 14 - 54 U/L   Alkaline Phosphatase 90 38 - 126 U/L   Total Bilirubin 0.5 0.3 - 1.2 mg/dL   GFR calc non Af Amer >60 >60 mL/min   GFR calc Af Amer >60 >60 mL/min    Comment: (NOTE) The eGFR has been calculated using the CKD EPI equation. This calculation has not been validated in all clinical situations. eGFR's persistently <60 mL/min signify possible Chronic Kidney Disease.    Anion gap 10 5 - 15  Troponin I     Status: None    Collection Time: 03/31/16  8:50 PM  Result Value Ref Range   Troponin I <0.03 <0.03 ng/mL  Troponin I-serum (0, 3, 6 hours)     Status: None   Collection Time: 03/31/16 11:20 PM  Result Value Ref Range   Troponin I <0.03 <0.03 ng/mL  Troponin I     Status: Abnormal   Collection Time: 04/01/16  2:00 AM  Result Value Ref Range   Troponin I 0.03 (HH) <0.03 ng/mL    Comment: CRITICAL RESULT CALLED TO, READ BACK BY AND VERIFIED WITH CRYSTAL BALENTINE @ 0250 ON 04/01/2016 BY [facility]   Troponin I-serum (0, 3, 6 hours)     Status: Abnormal   Collection Time: 04/01/16  4:50 AM  Result Value Ref Range   Troponin I 0.03 (HH) <0.03 ng/mL    Comment: CRITICAL RESULT CALLED TO, READ BACK BY AND VERIFIED WITH CRYSTAL BALENTINE @ 0610 BY CAF     Dg Chest 1 View  Result Date: 03/31/2016 CLINICAL DATA:  Chest pain EXAM: CHEST 1 VIEW COMPARISON:  02/02/2016 FINDINGS: Previous median sternotomy and CABG procedure. The heart size and mediastinal contours are within normal limits. Both lungs are clear. The visualized skeletal structures are unremarkable. IMPRESSION: No active disease. Electronically Signed   By: Kerby Moors M.Rice.   On: 03/31/2016 21:13    Review of Systems  Constitutional: Positive for diaphoresis and malaise/fatigue.  HENT: Positive for congestion.   Eyes: Negative.   Respiratory: Positive for shortness of breath.   Cardiovascular: Positive for chest pain.  Gastrointestinal: Negative.   Genitourinary: Negative.   Musculoskeletal: Negative.   Skin: Negative.   Neurological: Positive for weakness.  Endo/Heme/Allergies: Negative.   Psychiatric/Behavioral: Negative.    Blood pressure (!) 147/67, pulse 66, temperature 97.9 F (36.6 C), temperature source Oral, resp. rate 13, height '5\' 4"'  (1.626 m), weight 60.3 kg (132 lb 14.4 oz), SpO2 96 %. Physical Exam  Nursing note and vitals reviewed. Constitutional: She is oriented to person, place, and time. She appears well-developed and  well-nourished.  HENT:  Head: Normocephalic and atraumatic.  Eyes: Conjunctivae and EOM are normal. Pupils are equal, round, and reactive to light.  Neck: Normal range of motion. Neck supple.  Cardiovascular: Normal rate and regular rhythm.   Murmur heard. Respiratory: Effort normal and breath sounds normal.  GI: Soft. Bowel sounds are normal.  Musculoskeletal: Normal range of motion.  Neurological: She is alert and oriented to person, place, and time. She has normal reflexes.  Skin: Skin is warm and dry.  Psychiatric: She has a normal mood and affect.    Assessment/Plan: Unstable angina Coronary artery disease Coronary bypass surgery 12/31/15 Hypertension  Hyperlipidemia Chronic fatigue GERD . Plan Agree with admission for rule out myocardial infarction Continue telemetry Recommend anticoagulation His cardiogram for assessment of left ventricular function and murmur Follow-up EKGs and cardiac enzymes Continue hypertension control Agree with reflux therapy for possible indigestion Recommend invasive strategy to evaluate coronary bypass grafts as well as multivessel native coronary disease  Sheena Rice Sheena Rice 04/01/2016, 7:03 PM

## 2016-04-01 NOTE — H&P (Addendum)
Cardiology History & Physical    Patient ID: Sheena Rice MRN: [id number], DOB: 21-Dec-1940 Date of Encounter: 04/01/2016, 11:32 PM  HPI: Sheena Rice is a 76 y.o. female with history of CAD s/p 4 vessel CABG (12/2015, LIMA to LAD, SVG to OM, diagonal and R-PDA) who presents with unstable angina.  Pt had been doing very well post CABG; she has been attending cardiac rehab and  has been able to exercise comfortably at progressively increasing workloads over the past several weeks.  The day PTA, pt noted the onset of L sided arm pain (her anginal equivalent) while riding the recumbent bike at cardiac rehab.  This subsided with rest, but she had several more episodes of arm pain with lighter exertion over the next several hours.   She presented to [facility] for further evaluation.  She was admittted and underwent coronary angiogram by Dr. Clayborn Bigness (via RFA with Mynx closure device) , which per report showed occluded vein grafts to OM and diagonal territories, and a 95% stenosis of distal SVG to PDA anastomosis.  The LIMA to LAD was patent.  Low pressure POBA of the native distal RCA was performed with minimal change in flow or angiographic appearance.  An attempt to engage the SVG to PDA with a guide catheter resulted in complete occlusion of the vessel.  As a result, the procedure was aborted and the patient was referred to [facility] for consideration of high risk intervention.  Upon my interview, pt denies CP, arm pain, SOB, PND, orthopnea, palpitations or LE edema.   Past Medical History:  Diagnosis Date  . Arthritis    OA  . Chronic fatigue   . Coronary artery disease   . GERD (gastroesophageal reflux disease)   . Hypertension   . Unstable angina [facility])      Surgical History:  Past Surgical History:  Procedure Laterality Date  . APPENDECTOMY    . CARDIAC CATHETERIZATION Left 12/29/2015   Procedure: Left Heart Cath and Coronary Angiography;  Surgeon: Teodoro Spray, MD;  Location: [facility];  Service: Cardiovascular;  Laterality: Left;  . CATARACT EXTRACTION    . CHOLECYSTECTOMY    . CORONARY ARTERY BYPASS GRAFT N/A 12/31/2015   Procedure: CORONARY ARTERY BYPASS GRAFTING (CABG), ON PUMP, TIMES FOUR, USING LEFT INTERNAL MAMMARY ARTERY, RIGHT GREATER SAPHENOUS VEIN HARVESTED ENDOSCOPICALLY;  Surgeon: [name], MD;  Location: [place];  Service: Open Heart Surgery;  Laterality: N/A;  -LIMA to LAD -SVG to DIAGONAL -SVG to OM -SVG to PDA  . TEE WITHOUT CARDIOVERSION N/A 12/31/2015   Procedure: TRANSESOPHAGEAL ECHOCARDIOGRAM (TEE);  Surgeon: [name], MD;  Location: [place];  Service: Open Heart Surgery;  Laterality: N/A;     Home Meds: Prior to Admission medications   Medication Sig Start Date End Date Taking? Authorizing Provider  acetaminophen (TYLENOL) 325 MG tablet Take 2 tablets (650 mg total) by mouth every 6 (six) hours as needed for mild pain or headache. Patient not taking: Reported on 03/31/2016 01/05/16   Lodema Hong Barrett, PA-C  aspirin EC 81 MG EC tablet Take 1 tablet (81 mg total) by mouth daily. 12/30/15   Teodoro Spray, MD  Cholecalciferol (VITAMIN D3) 5000 units CAPS Take 5,000 Units by mouth daily.     Historical Provider, MD  co-enzyme Q-10 30 MG capsule Take 300 mg by mouth daily.    Historical Provider, MD  colestipol (COLESTID) 1 g tablet Take 2 tablets by mouth 2 (  two) times daily. 03/24/15   Historical Provider, MD  cyclobenzaprine (FLEXERIL) 10 MG tablet Take 10 mg by mouth at bedtime.  11/17/14   Historical Provider, MD  esomeprazole (NEXIUM) 20 MG capsule Take 20 mg by mouth every morning.     Historical Provider, MD  Flaxseed, Linseed, (FLAX SEED OIL PO) Take 1,400 mg by mouth daily.    Historical Provider, MD  gemfibrozil (LOPID) 600 MG tablet Take 600 mg by mouth 2 (two) times daily.  11/17/14   Historical Provider, MD  guaiFENesin (MUCINEX) 600 MG 12 hr tablet Take 1 tablet (600 mg total) by mouth every 12 (twelve) hours  as needed for cough. Patient not taking: Reported on 03/31/2016 01/05/16   Lodema Hong Barrett, PA-C  Magnesium Oxide 400 MG CAPS Take 400 mg by mouth 2 (two) times daily.     Historical Provider, MD  metoprolol tartrate (LOPRESSOR) 25 MG tablet Take 1 tablet (25 mg total) by mouth 2 (two) times daily. 01/05/16   Erin R Barrett, PA-C  Probiotic Product (PROBIOTIC DAILY) CAPS Take 1 capsule by mouth daily.    Historical Provider, MD    Allergies:  Allergies  Allergen Reactions  . Pravastatin Other (See Comments)    Muscle pain "STATIN" Drugs    Social History   Social History  . Marital status: Married    Spouse name: N/A  . Number of children: N/A  . Years of education: N/A   Occupational History  . Not on file.   Social History Main Topics  . Smoking status: Never Smoker  . Smokeless tobacco: Never Used  . Alcohol use No  . Drug use: No  . Sexual activity: Not on file   Other Topics Concern  . Not on file   Social History Narrative  . No narrative on file     History reviewed. No pertinent family history.  Review of Systems:  All systems reviewed and are negative, except as noted in HPI.  Labs:   Lab Results  Component Value Date   WBC 5.4 03/31/2016   HGB 13.4 03/31/2016   HCT 38.8 03/31/2016   MCV 87.0 03/31/2016   PLT 209 03/31/2016    Recent Labs Lab 03/31/16 2050  NA 137  K 4.0  CL 103  CO2 24  BUN 22*  CREATININE 0.61  CALCIUM 9.8  PROT 7.6  BILITOT 0.5  ALKPHOS 90  ALT 18  AST 32  GLUCOSE 124*    Recent Labs  03/31/16 2050 03/31/16 2320 04/01/16 0200 04/01/16 0450  TROPONINI <0.03 <0.03 0.03* 0.03*   No results found for: CHOL, HDL, LDLCALC, TRIG No results found for: DDIMER  Radiology/Studies:  Dg Chest 1 View  Result Date: 03/31/2016 CLINICAL DATA:  Chest pain EXAM: CHEST 1 VIEW COMPARISON:  02/02/2016 FINDINGS: Previous median sternotomy and CABG procedure. The heart size and mediastinal contours are within normal limits. Both  lungs are clear. The visualized skeletal structures are unremarkable. IMPRESSION: No active disease. Electronically Signed   By: Kerby Moors M.D.   On: 03/31/2016 21:13   Wt Readings from Last 3 Encounters:  04/01/16 60.8 kg (134 lb 1.6 oz)  03/31/16 60.3 kg (132 lb 14.4 oz)  02/15/16 59.5 kg (131 lb 3.2 oz)    EKG (this AM): Sinus bradycardia, otherwise unremarkable  LHC (3/16): Preserved overall left ventricular function Moderate to severe mitral regurgitation Multivessel coronary disease Occluded vein graft to diagonal and circumflex origin Severe disease distal vein graft to PDA insertion Normal  LIMA to mid to distal LAD Partially successful POBA distal native RCA to PL Recommend transfer to tertiary care center for complex intervention to SVG to RCA as well as native distal RCA  Physical Exam: Blood pressure (!) 169/71, pulse 66, temperature 98 F (36.7 C), temperature source Oral, resp. rate 18, height 5' 4.5" (1.638 m), weight 60.8 kg (134 lb 1.6 oz), SpO2 99 %. Body mass index is 22.66 kg/m. General: Well developed, well nourished, in no acute distress. Head: Normocephalic, atraumatic, sclera non-icteric, no xanthomas, nares are without discharge.  Neck: Negative for carotid bruits. JVD not elevated. Lungs: Clear bilaterally to auscultation without wheezes, rales, or rhonchi. Breathing is unlabored. Heart: RRR with S1 S2. No murmurs, rubs, or gallops appreciated. Abdomen: Soft, non-tender, non-distended with normoactive bowel sounds. No hepatomegaly. No rebound/guarding. No obvious abdominal masses. Msk:  Strength and tone appear normal for age. Extremities: No clubbing or cyanosis. No edema.  Distal pedal pulses are 2+ and equal bilaterally.   Neuro: Alert and oriented X 3. No focal deficit. No facial asymmetry. Moves all extremities spontaneously. Psych:  Responds to questions appropriately with a normal affect.    Assessment and Plan   76 y.o. female with history  of CAD s/p 4 vessel CABG (12/2015, LIMA to LAD, SVG to OM, diagonal and R-PDA) who presents with unstable angina, found to have multiple occluded vein grafts.  She is now referred for consideration of high risk intervention.  1. Unstable angina:  Currently pain free.  Plan to review cath films and decide on potential PCI targets.  Will initiate IV heparin infusion in the interim and continue low dose ASA.   Will hold on P2Y12 inhibition at present.  Continue home metoprolol.   2.  HLD: Would consider trial of different statin versus possible PCSK9 inhibition for lipid management going forward.  For now, will continue home colestipol and gemfibrozil.  Check lipid panel in AM.  3. GERD: Continue PPI.  Merrilee Seashore MD 04/01/2016, 11:32 PM

## 2016-04-01 NOTE — Consult Note (Signed)
Patient had diagnostic cardiac catheter with identified multivessel coronary disease and multiple failed bypass grafts which were placed less than 3 months ago. Attempt at PCI and stent were essentially unsuccessful and the case was aborted and we made a decision to transfer the patient to discus center for complex intervention Dr. Debara Pickett accepted the patient in transfer. Patient was stable pain-free at the end of the case we placed on heparin IV aspirin [name]. Sheath was removed and a minx deployed and patient was transferred to telemetry back to her room in anticipation of transfer to [facility] hopefully today. Case was discussed hospitalist Dr. Darvin Neighbours. He agreed to handle transfer documentation.

## 2016-04-01 NOTE — Progress Notes (Signed)
New Admission Note:   Arrival Method: From [facility] via [facility] Mental Orientation: A&O Telemetry: Box 2w33 Assessment: Completed Skin: Intact IV: L hand      R FA Pain: Pt denies any pain Tubes: None Safety Measures: Safety Fall Prevention Plan has been discussed  Admission 2 West Orientation: Patient has been orientated to the room, unit and staff.  Family: Daughter, Son, and Husband at bedside  Orders to be reviewed and implemented. Will continue to monitor the patient. Call light has been placed within reach and bed alarm has been activated. Admitting Doctor paged upon Pt arrival. Tele box applied. CCMD notified.     Isac Caddy, RN

## 2016-04-01 NOTE — Progress Notes (Signed)
ANTICOAGULATION CONSULT NOTE - Initial Consult  Pharmacy Consult for Heparin  Indication: chest pain/ACS  Allergies  Allergen Reactions  . Pravastatin Other (See Comments)    Muscle pain "STATIN" Drugs    Patient Measurements: Height: 5' 4.5" (163.8 cm) Weight: 134 lb 1.6 oz (60.8 kg) IBW/kg (Calculated) : 55.85  Vital Signs: Temp: 98 F (36.7 C) (03/16 2158) Temp Source: Oral (03/16 2158) BP: 169/71 (03/16 2158) Pulse Rate: 66 (03/16 2158)  Labs:  Recent Labs  03/31/16 2050 03/31/16 2320 04/01/16 0200 04/01/16 0450 04/01/16 2319  HGB 13.4  --   --   --  13.3  HCT 38.8  --   --   --  39.2  PLT 209  --   --   --  204  APTT  --   --   --   --  35  LABPROT  --   --   --   --  13.5  INR  --   --   --   --  1.03  CREATININE 0.61  --   --   --   --   TROPONINI <0.03 <0.03 0.03* 0.03*  --     Estimated Creatinine Clearance: 54.4 mL/min (by C-G formula based on SCr of 0.61 mg/dL).  Medical History: Past Medical History:  Diagnosis Date  . Arthritis    OA  . Chronic fatigue   . Coronary artery disease   . GERD (gastroesophageal reflux disease)   . Hypertension   . Unstable angina [facility])     Assessment: 76 y/o F transfer from [facility] s/p cath on 3/16. Pt also had recent CABG. Pt transferred for eval of high risk intervention. Heparin is to start 8 hours post sheath removal. Per [facility] notes, sheath removed ~1630 today. Will start heparin ~0030 3/17.   Goal of Therapy:  Heparin level 0.3-0.7 units/ml Monitor platelets by anticoagulation protocol: Yes   Plan:  -Start heparin drip at 750 units/hr on 3/17 ~0030 -0900 HL -Monitor for bleeding -F/U cardiology plans  Narda Bonds 04/01/2016,11:55 PM

## 2016-04-01 NOTE — Progress Notes (Signed)
Called transfer report to Randol Kern, RN at this time. Receiving physician is Dr. Debara Pickett. Will confirm heparin drip dose. Will d/c N.S. infusion. Waiting for [facility]. Wenda Low [facility]

## 2016-04-01 NOTE — Care Management Obs Status (Signed)
[place] NOTIFICATION   Patient Details  Name: Sheena Rice MRN: [id number] Date of Birth: 10/16/1940   Medicare Observation Status Notification Given:  Other (see comment) No t avaialble.  In the cath lab when attempted   Katrina Stack, RN 04/01/2016, 3:53 PM

## 2016-04-01 NOTE — Progress Notes (Signed)
ANTICOAGULATION CONSULT NOTE - Initial Consult  Pharmacy Consult for heparin dosing  Indication: chest pain/ACS  Allergies  Allergen Reactions  . Pravastatin Other (See Comments)    Muscle pain "STATIN" Drugs    Patient Measurements: Height: 5\' 4"  (162.6 cm) Weight: 132 lb 14.4 oz (60.3 kg) IBW/kg (Calculated) : 54.7  Vital Signs: Temp: 98.2 F (36.8 C) (03/16 1203) Temp Source: Oral (03/16 1203) BP: 136/61 (03/16 1715) Pulse Rate: 61 (03/16 1715)  Labs:  Recent Labs  03/31/16 2050 03/31/16 2320 04/01/16 0200 04/01/16 0450  HGB 13.4  --   --   --   HCT 38.8  --   --   --   PLT 209  --   --   --   CREATININE 0.61  --   --   --   TROPONINI <0.03 <0.03 0.03* 0.03*    Estimated Creatinine Clearance: 53.3 mL/min (by C-G formula based on SCr of 0.61 mg/dL).   Medical History: Past Medical History:  Diagnosis Date  . Arthritis    OA  . Chronic fatigue   . Coronary artery disease   . GERD (gastroesophageal reflux disease)   . Hypertension   . Unstable angina [facility])     Assessment: 76 yo female who underwent cath procedure today. Pharmacy consulted to start heparin drip 8 hours post-sheath removal. According to procedure notes- sheath removal was documented at 1627 on 3/16.  Goal of Therapy:  Heparin level 0.3-0.7 units/ml Monitor platelets by anticoagulation protocol: Yes   Plan:  Start heparin infusion at 750 units/hr at [facility] on 3/17 Check anti-Xa level and CBC ordered in 8 hours and daily while on heparin Continue to monitor H&H and platelets  Pernell Dupre, PharmD, BCPS Clinical Pharmacist 04/01/2016 5:55 PM

## 2016-04-01 NOTE — Progress Notes (Signed)
During session Leylanie reported chest discomfort and arm pain on the NS. All vitals were within normal limits. RN's P. Surles and K. Spencer noted ST depressions on tele, but in review of prior session depressions had been present and were unchanged.  Symptoms went away with rest. Symptoms did not reoccur on the TM or seated elliptical (XR).  [name] was instructed to call her Dr or go to ED if symptoms return.

## 2016-04-01 NOTE — Progress Notes (Signed)
PHARMACIST - PHYSICIAN ORDER COMMUNICATION  CONCERNING: P&T Medication Policy on Herbal Medications  DESCRIPTION:  This patient's order for:  Co-enzyme Q10 has been noted.  This product(s) is classified as an "herbal" or natural product. Due to a lack of definitive safety studies or FDA approval, nonstandard manufacturing practices, plus the potential risk of unknown drug-drug interactions while on inpatient medications, the Pharmacy and Therapeutics Committee does not permit the use of "herbal" or natural products of this type within [place].   ACTION TAKEN: The pharmacy department is unable to verify this order at this time and your patient has been informed of this safety policy. Please reevaluate patient's clinical condition at discharge and address if the herbal or natural product(s) should be resumed at that time.   

## 2016-04-01 NOTE — Progress Notes (Signed)
[facility] NAME: Sheena Rice    MR#:  [id number]  DATE OF BIRTH:  03-03-1940  SUBJECTIVE:  CHIEF COMPLAINT:   Chief Complaint  Patient presents with  . Chest Pain   Chest pain witm minimal activity Tele- No arrhythmias  REVIEW OF SYSTEMS:    Review of Systems  Constitutional: Negative for chills and fever.  HENT: Negative for sore throat.   Eyes: Negative for blurred vision, double vision and pain.  Respiratory: Negative for cough, hemoptysis, shortness of breath and wheezing.   Cardiovascular: Positive for chest pain. Negative for palpitations, orthopnea and leg swelling.  Gastrointestinal: Negative for abdominal pain, constipation, diarrhea, heartburn, nausea and vomiting.  Genitourinary: Negative for dysuria and hematuria.  Musculoskeletal: Negative for back pain and joint pain.  Skin: Negative for rash.  Neurological: Negative for sensory change, speech change, focal weakness and headaches.  Endo/Heme/Allergies: Does not bruise/bleed easily.  Psychiatric/Behavioral: Negative for depression. The patient is not nervous/anxious.     DRUG ALLERGIES:   Allergies  Allergen Reactions  . Pravastatin Other (See Comments)    Muscle pain "STATIN" Drugs    VITALS:  Blood pressure (!) 169/60, pulse 69, temperature 98.2 F (36.8 C), temperature source Oral, resp. rate 14, height 5\' 4"  (1.626 m), weight 60.3 kg (132 lb 14.4 oz), SpO2 99 %.  PHYSICAL EXAMINATION:   Physical Exam  GENERAL:  76 y.o.-year-old patient lying in the bed with no acute distress.  EYES: Pupils equal, round, reactive to light and accommodation. No scleral icterus. Extraocular muscles intact.  HEENT: Head atraumatic, normocephalic. Oropharynx and nasopharynx clear.  NECK:  Supple, no jugular venous distention. No thyroid enlargement, no tenderness.  LUNGS: Normal breath sounds bilaterally, no wheezing, rales, rhonchi. No use of accessory muscles of  respiration.  CARDIOVASCULAR: S1, S2 normal. No murmurs, rubs, or gallops.  ABDOMEN: Soft, nontender, nondistended. Bowel sounds present. No organomegaly or mass.  EXTREMITIES: No cyanosis, clubbing or edema b/l.    NEUROLOGIC: Cranial nerves II through XII are intact. No focal Motor or sensory deficits b/l.   PSYCHIATRIC: The patient is alert and oriented x 3.  SKIN: No obvious rash, lesion, or ulcer.   LABORATORY PANEL:   CBC  Recent Labs Lab 03/31/16 2050  WBC 5.4  HGB 13.4  HCT 38.8  PLT 209   ------------------------------------------------------------------------------------------------------------------ Chemistries   Recent Labs Lab 03/31/16 2050  NA 137  K 4.0  CL 103  CO2 24  GLUCOSE 124*  BUN 22*  CREATININE 0.61  CALCIUM 9.8  AST 32  ALT 18  ALKPHOS 90  BILITOT 0.5   ------------------------------------------------------------------------------------------------------------------  Cardiac Enzymes  Recent Labs Lab 04/01/16 0450  TROPONINI 0.03*   ------------------------------------------------------------------------------------------------------------------  RADIOLOGY:  Dg Chest 1 View  Result Date: 03/31/2016 CLINICAL DATA:  Chest pain EXAM: CHEST 1 VIEW COMPARISON:  02/02/2016 FINDINGS: Previous median sternotomy and CABG procedure. The heart size and mediastinal contours are within normal limits. Both lungs are clear. The visualized skeletal structures are unremarkable. IMPRESSION: No active disease. Electronically Signed   By: Kerby Moors M.D.   On: 03/31/2016 21:13     ASSESSMENT AND PLAN:   * Typical chest pain ASA, Statin Cath later today Discussed with Dr. Clayborn Bigness Troponins normal  * HTN On home meds  All the records are reviewed and case discussed with Care Management/Social Workerr. Management plans discussed with the patient, family and they are in agreement.  CODE STATUS: FULL CODE  DVT Prophylaxis:  SCDs  TOTAL  TIME TAKING CARE OF THIS PATIENT: 30 minutes.   POSSIBLE D/C IN 1-2 DAYS, DEPENDING ON CLINICAL CONDITION.  Hillary Bow R M.D on 04/01/2016 at 2:50 PM  Between 7am to 6pm - Pager - [phone]  After 6pm go to [email or web address] - password [email or web address] [place] [facility]  Office  [phone]  CC: Primary care physician; Madelyn Brunner, MD  Note: This dictation was prepared with Dragon dictation along with smaller phrase technology. Any transcriptional errors that result from this process are unintentional.

## 2016-04-01 NOTE — Plan of Care (Signed)
Problem: Cardiac: Goal: Ability to achieve and maintain adequate cardiovascular perfusion will improve Outcome: Not Progressing Pt. to have cardiac catheterization today.

## 2016-04-01 NOTE — Discharge Summary (Addendum)
[facility] [place] NAME: Sheena Rice    MR#:  [id number]  DATE OF BIRTH:  1940-11-21  DATE OF ADMISSION:  03/31/2016 ADMITTING PHYSICIAN: Harvie Bridge, DO  DATE OF DISCHARGE: 04/01/2016  PRIMARY CARE PHYSICIAN: Madelyn Brunner, MD   ADMISSION DIAGNOSIS:  Chest pain, unspecified type [R07.9]  DISCHARGE DIAGNOSIS:  Active Problems:   Chest pain, rule out acute myocardial infarction   SECONDARY DIAGNOSIS:   Past Medical History:  Diagnosis Date  . Arthritis    OA  . Chronic fatigue   . Coronary artery disease   . GERD (gastroesophageal reflux disease)   . Hypertension   . Unstable angina (HCC)      ADMITTING HISTORY  HISTORY OF PRESENT ILLNESS: Sheena Rice is a 76 y.o. female with a known history of OA, CAD s/p CABGx4 on 12/31/15, GERD, HTN, HLD, chronic fatigue, osteoporosis,  UA presents to the emergency department for evaluation of chest pain.  Patient was in a usual state of health until this morning when she developed chest pain during cardiac rehab.  Pain States that she was using an arm bike when she reports the sudden onset of described as tightness in her chest and left arm. She states the pain radiated down her arm and involved her entire left arm..  Patient took 4 ASA 81mg  prior to coming to ED and pain has since resolved.  She states she has been doing fairly well in cardiac rehabilitation and has been using this particular machine for the past 3 months without any problems. She reports the only other time she had this pain in her arm was when she was lifting something onto a high shelf.  Otherwise there has been no change in status. Patient has been taking medication as prescribed and there has been no recent change in medication or diet.  There has been no recent illness, travel or sick contacts.    Patient denies fevers/chills, weakness, dizziness, shortness of breath, N/V/C/D, abdominal pain,  dysuria/frequency, changes in mental status.   HOSPITAL COURSE:   * Typical chest pain Patient's troponin were normal and EKG showed no acute changes but due to patient's typical pain she had cathetrization with occlusion of all grafts per Dr. Clayborn Bigness. Final cath report is unavailable. She needs transfer to tertiary care center for further treatment.  Transfer to [facility] [place]. Accepting physician is Dr. Debara Pickett  CONSULTS OBTAINED:  Treatment Team:  Yolonda Kida, MD  DRUG ALLERGIES:   Allergies  Allergen Reactions  . Pravastatin Other (See Comments)    Muscle pain "STATIN" Drugs    DISCHARGE MEDICATIONS:   Current Discharge Medication List    CONTINUE these medications which have NOT CHANGED   Details  aspirin EC 81 MG EC tablet Take 1 tablet (81 mg total) by mouth daily. Qty: 30 tablet, Refills: 0    Cholecalciferol (VITAMIN D3) 5000 units CAPS Take 5,000 Units by mouth daily.     co-enzyme Q-10 30 MG capsule Take 300 mg by mouth daily.    colestipol (COLESTID) 1 g tablet Take 2 tablets by mouth 2 (two) times daily.    cyclobenzaprine (FLEXERIL) 10 MG tablet Take 10 mg by mouth at bedtime.     esomeprazole (NEXIUM) 20 MG capsule Take 20 mg by mouth every morning.     Flaxseed, Linseed, (FLAX SEED OIL PO) Take 1,400 mg by mouth daily.    gemfibrozil (LOPID) 600 MG tablet  Take 600 mg by mouth 2 (two) times daily.     Magnesium Oxide 400 MG CAPS Take 400 mg by mouth 2 (two) times daily.     metoprolol tartrate (LOPRESSOR) 25 MG tablet Take 1 tablet (25 mg total) by mouth 2 (two) times daily. Qty: 30 tablet, Refills: 3    Probiotic Product (PROBIOTIC DAILY) CAPS Take 1 capsule by mouth daily.    acetaminophen (TYLENOL) 325 MG tablet Take 2 tablets (650 mg total) by mouth every 6 (six) hours as needed for mild pain or headache.    guaiFENesin (MUCINEX) 600 MG 12 hr tablet Take 1 tablet (600 mg total) by mouth every 12 (twelve) hours as  needed for cough.        Today   VITAL SIGNS:  Blood pressure 136/61, pulse 61, temperature 98.2 F (36.8 C), temperature source Oral, resp. rate 13, height 5\' 4"  (1.626 m), weight 60.3 kg (132 lb 14.4 oz), SpO2 96 %.  I/O:   Intake/Output Summary (Last 24 hours) at 04/01/16 1734 Last data filed at 04/01/16 0300  Gross per 24 hour  Intake                0 ml  Output              600 ml  Net             -600 ml    PHYSICAL EXAMINATION:  Physical Exam  GENERAL:  76 y.o.-year-old patient lying in the bed with no acute distress.  LUNGS: Normal breath sounds bilaterally, no wheezing, rales,rhonchi or crepitation. No use of accessory muscles of respiration.  CARDIOVASCULAR: S1, S2 normal. No murmurs, rubs, or gallops.  ABDOMEN: Soft, non-tender, non-distended. Bowel sounds present. No organomegaly or mass.  NEUROLOGIC: Moves all 4 extremities. PSYCHIATRIC: The patient is alert and oriented x 3.  SKIN: No obvious rash, lesion, or ulcer.   DATA REVIEW:   CBC  Recent Labs Lab 03/31/16 2050  WBC 5.4  HGB 13.4  HCT 38.8  PLT 209    Chemistries   Recent Labs Lab 03/31/16 2050  NA 137  K 4.0  CL 103  CO2 24  GLUCOSE 124*  BUN 22*  CREATININE 0.61  CALCIUM 9.8  AST 32  ALT 18  ALKPHOS 90  BILITOT 0.5    Cardiac Enzymes  Recent Labs Lab 04/01/16 0450  TROPONINI 0.03*    Microbiology Results  Results for orders placed or performed during the hospital encounter of 12/30/15  MRSA PCR Screening     Status: None   Collection Time: 12/31/15  2:48 PM  Result Value Ref Range Status   MRSA by PCR NEGATIVE NEGATIVE Final    Comment:        The GeneXpert MRSA Assay (FDA approved for NASAL specimens only), is one component of a comprehensive MRSA colonization surveillance program. It is not intended to diagnose MRSA infection nor to guide or monitor treatment for MRSA infections.     RADIOLOGY:  Dg Chest 1 View  Result Date: 03/31/2016 CLINICAL DATA:   Chest pain EXAM: CHEST 1 VIEW COMPARISON:  02/02/2016 FINDINGS: Previous median sternotomy and CABG procedure. The heart size and mediastinal contours are within normal limits. Both lungs are clear. The visualized skeletal structures are unremarkable. IMPRESSION: No active disease. Electronically Signed   By: Kerby Moors M.D.   On: 03/31/2016 21:13    Follow up with PCP in 1 week.  Management plans discussed with the patient, family  and they are in agreement.  CODE STATUS:     Code Status Orders        Start     Ordered   03/31/16 2254  Full code  Continuous     03/31/16 2253    Code Status History    Date Active Date Inactive Code Status Order ID Comments User Context   12/30/2015  1:23 PM 01/05/2016  3:14 PM Full Code [id number]  Elgie Collard, PA-C Inpatient   12/29/2015  5:53 PM 12/30/2015  9:45 AM Full Code [id number]  Teodoro Spray, MD Inpatient   12/29/2015  4:28 PM 12/29/2015  5:53 PM Full Code [id number]  Teodoro Spray, MD Inpatient      TOTAL TIME TAKING CARE OF THIS PATIENT ON DAY OF DISCHARGE: more than 30 minutes.   Hillary Bow R M.D on 04/01/2016 at 5:34 PM  Between 7am to 6pm - Pager - [phone]  After 6pm go to [email or web address] - password [email or web address] [facility]  Office  [phone]  CC: Primary care physician; Madelyn Brunner, MD  Note: This dictation was prepared with Dragon dictation along with smaller phrase technology. Any transcriptional errors that result from this process are unintentional.

## 2016-04-01 NOTE — Progress Notes (Signed)
PHARMACIST - PHYSICIAN ORDER COMMUNICATION  CONCERNING: P&T Medication Policy on Herbal Medications  DESCRIPTION:  This patient's order for:  Co-Enzyme Q10 and Flaxseed Oil  has been noted.  This product(s) is classified as an "herbal" or natural product. Due to a lack of definitive safety studies or FDA approval, nonstandard manufacturing practices, plus the potential risk of unknown drug-drug interactions while on inpatient medications, the Pharmacy and Therapeutics Committee does not permit the use of "herbal" or natural products of this type within [facility].   ACTION TAKEN: The pharmacy department is unable to verify this order at this time and your patient has been informed of this safety policy. Please reevaluate patient's clinical condition at discharge and address if the herbal or natural product(s) should be resumed at that time.

## 2016-04-02 ENCOUNTER — Encounter (HOSPITAL_COMMUNITY)
Admission: AD | Disposition: A | Discharge status: Home / Self Care | Payer: Self-pay | Source: Other Acute Inpatient Hospital | Attending: Internal Medicine

## 2016-04-02 ENCOUNTER — Other Ambulatory Visit: Payer: Self-pay

## 2016-04-02 DIAGNOSIS — I2582 Chronic total occlusion of coronary artery: Secondary | ICD-10-CM

## 2016-04-02 DIAGNOSIS — I214 Non-ST elevation (NSTEMI) myocardial infarction: Secondary | ICD-10-CM

## 2016-04-02 DIAGNOSIS — I25110 Atherosclerotic heart disease of native coronary artery with unstable angina pectoris: Principal | ICD-10-CM

## 2016-04-02 HISTORY — PX: CORONARY STENT INTERVENTION: CATH118234

## 2016-04-02 LAB — COMPREHENSIVE METABOLIC PANEL
ALT: 19 U/L (ref 14–54)
AST: 36 U/L (ref 15–41)
Albumin: 3.4 g/dL — ABNORMAL LOW (ref 3.5–5.0)
Alkaline Phosphatase: 73 U/L (ref 38–126)
Anion gap: 10 (ref 5–15)
BUN: 14 mg/dL (ref 6–20)
CO2: 24 mmol/L (ref 22–32)
Calcium: 9 mg/dL (ref 8.9–10.3)
Chloride: 105 mmol/L (ref 101–111)
Creatinine, Ser: 0.79 mg/dL (ref 0.44–1.00)
GFR calc Af Amer: >60 mL/min (ref >60)
GFR calc non Af Amer: >60 mL/min (ref >60)
Glucose, Bld: 109 mg/dL — ABNORMAL HIGH (ref 65–99)
Potassium: 4.3 mmol/L (ref 3.5–5.1)
Sodium: 139 mmol/L (ref 135–145)
Total Bilirubin: 0.7 mg/dL (ref 0.3–1.2)
Total Protein: 6.8 g/dL (ref 6.5–8.1)

## 2016-04-02 LAB — TROPONIN I
Troponin I: 0.11 ng/mL (ref <0.03)
Troponin I: 0.16 ng/mL (ref <0.03)
Troponin I: 0.83 ng/mL (ref <0.03)

## 2016-04-02 LAB — BRAIN NATRIURETIC PEPTIDE: B Natriuretic Peptide: 381.6 pg/mL — ABNORMAL HIGH (ref 0.0–100.0)

## 2016-04-02 LAB — LIPID PANEL
Cholesterol: 199 mg/dL (ref 0–200)
HDL: 35 mg/dL — ABNORMAL LOW (ref >40)
LDL Cholesterol: 139 mg/dL — ABNORMAL HIGH (ref 0–99)
Total CHOL/HDL Ratio: 5.7 RATIO
Triglycerides: 127 mg/dL (ref <150)
VLDL: 25 mg/dL (ref 0–40)

## 2016-04-02 LAB — HEPARIN LEVEL (UNFRACTIONATED): HEPARIN UNFRACTIONATED: 0.54 [IU]/mL (ref 0.30–0.70)

## 2016-04-02 SURGERY — CORONARY STENT INTERVENTION
Anesthesia: LOCAL

## 2016-04-02 MED ORDER — IOPAMIDOL (ISOVUE-370) INJECTION 76%
INTRAVENOUS | Status: AC
  Filled 2016-04-02: qty 50

## 2016-04-02 MED ORDER — HEPARIN SODIUM (PORCINE) 1000 UNIT/ML IJ SOLN
INTRAMUSCULAR | Status: DC | PRN
  Administered 2016-04-02: 8000 [IU] via INTRAVENOUS

## 2016-04-02 MED ORDER — MORPHINE SULFATE (PF) 4 MG/ML IV SOLN
INTRAVENOUS | Status: AC
  Filled 2016-04-02: qty 1

## 2016-04-02 MED ORDER — LABETALOL HCL 5 MG/ML IV SOLN: 10.0000 mg | INTRAVENOUS | Status: AC | PRN

## 2016-04-02 MED ORDER — FENTANYL CITRATE (PF) 100 MCG/2ML IJ SOLN
INTRAMUSCULAR | Status: AC
  Filled 2016-04-02: qty 2

## 2016-04-02 MED ORDER — IOPAMIDOL (ISOVUE-370) INJECTION 76%
INTRAVENOUS | Status: DC | PRN
  Administered 2016-04-02: 110 mL via INTRA_ARTERIAL

## 2016-04-02 MED ORDER — NITROGLYCERIN 2 % TD OINT
0.5000 [in_us] | TOPICAL_OINTMENT | Freq: Four times a day (QID) | TRANSDERMAL | Status: DC
  Administered 2016-04-02: 0.5 [in_us] via TOPICAL
  Filled 2016-04-02: qty 30

## 2016-04-02 MED ORDER — SODIUM CHLORIDE 0.9% FLUSH
3.0000 mL | Freq: Two times a day (BID) | INTRAVENOUS | Status: DC
  Administered 2016-04-02: 3 mL via INTRAVENOUS

## 2016-04-02 MED ORDER — VERAPAMIL HCL 2.5 MG/ML IV SOLN
INTRAVENOUS | Status: AC
  Filled 2016-04-02: qty 2

## 2016-04-02 MED ORDER — LIDOCAINE HCL (PF) 1 % IJ SOLN
INTRAMUSCULAR | Status: AC
  Filled 2016-04-02: qty 30

## 2016-04-02 MED ORDER — SODIUM CHLORIDE 0.9% FLUSH
3.0000 mL | Freq: Two times a day (BID) | INTRAVENOUS | Status: DC
  Administered 2016-04-03 – 2016-04-04 (×3): 3 mL via INTRAVENOUS

## 2016-04-02 MED ORDER — LIDOCAINE HCL (PF) 1 % IJ SOLN
INTRAMUSCULAR | Status: DC | PRN
  Administered 2016-04-02: 2 mL

## 2016-04-02 MED ORDER — VERAPAMIL HCL 2.5 MG/ML IV SOLN
INTRAVENOUS | Status: DC | PRN
  Administered 2016-04-02: 10 mL via INTRA_ARTERIAL

## 2016-04-02 MED ORDER — MIDAZOLAM HCL 2 MG/2ML IJ SOLN
INTRAMUSCULAR | Status: AC
  Filled 2016-04-02: qty 2

## 2016-04-02 MED ORDER — NITROGLYCERIN 1 MG/10 ML FOR IR/CATH LAB
INTRA_ARTERIAL | Status: AC
  Filled 2016-04-02: qty 10

## 2016-04-02 MED ORDER — TICAGRELOR 90 MG PO TABS
90.0000 mg | ORAL_TABLET | Freq: Two times a day (BID) | ORAL | Status: DC
  Administered 2016-04-02 – 2016-04-04 (×4): 90 mg via ORAL
  Filled 2016-04-02 (×4): qty 1

## 2016-04-02 MED ORDER — HEPARIN (PORCINE) IN NACL 2-0.9 UNIT/ML-% IJ SOLN
INTRAMUSCULAR | Status: DC | PRN
  Administered 2016-04-02: 1000 mL

## 2016-04-02 MED ORDER — SODIUM CHLORIDE 0.9% FLUSH: 3.0000 mL | INTRAVENOUS | Status: DC | PRN

## 2016-04-02 MED ORDER — TICAGRELOR 90 MG PO TABS
180.0000 mg | ORAL_TABLET | Freq: Once | ORAL | Status: AC
  Administered 2016-04-02: 180 mg via ORAL
  Filled 2016-04-02: qty 2

## 2016-04-02 MED ORDER — SODIUM CHLORIDE 0.9 % IV SOLN: 250.0000 mL | INTRAVENOUS | Status: DC | PRN

## 2016-04-02 MED ORDER — HEPARIN SODIUM (PORCINE) 1000 UNIT/ML IJ SOLN
INTRAMUSCULAR | Status: AC
  Filled 2016-04-02: qty 1

## 2016-04-02 MED ORDER — ASPIRIN 81 MG PO CHEW: 81.0000 mg | CHEWABLE_TABLET | ORAL | Status: DC

## 2016-04-02 MED ORDER — SODIUM CHLORIDE 0.9 % IV SOLN: INTRAVENOUS | Status: AC

## 2016-04-02 MED ORDER — ROSUVASTATIN CALCIUM 10 MG PO TABS
10.0000 mg | ORAL_TABLET | Freq: Every day | ORAL | Status: DC
  Administered 2016-04-02 – 2016-04-03 (×2): 10 mg via ORAL
  Filled 2016-04-02 (×2): qty 1

## 2016-04-02 MED ORDER — MIDAZOLAM HCL 2 MG/2ML IJ SOLN
INTRAMUSCULAR | Status: DC | PRN
  Administered 2016-04-02: 1 mg via INTRAVENOUS

## 2016-04-02 MED ORDER — NITROGLYCERIN 1 MG/10 ML FOR IR/CATH LAB
INTRA_ARTERIAL | Status: DC | PRN
  Administered 2016-04-02 (×2): 200 ug via INTRACORONARY

## 2016-04-02 MED ORDER — HEPARIN (PORCINE) IN NACL 2-0.9 UNIT/ML-% IJ SOLN
INTRAMUSCULAR | Status: AC
  Filled 2016-04-02: qty 1000

## 2016-04-02 MED ORDER — FENTANYL CITRATE (PF) 100 MCG/2ML IJ SOLN
INTRAMUSCULAR | Status: DC | PRN
  Administered 2016-04-02: 25 ug via INTRAVENOUS

## 2016-04-02 MED ORDER — HYDRALAZINE HCL 20 MG/ML IJ SOLN: 5.0000 mg | INTRAMUSCULAR | Status: AC | PRN

## 2016-04-02 MED ORDER — SODIUM CHLORIDE 0.9 % WEIGHT BASED INFUSION: 1.0000 mL/kg/h | INTRAVENOUS | Status: DC

## 2016-04-02 MED ORDER — MORPHINE SULFATE (PF) 4 MG/ML IV SOLN
4.0000 mg | Freq: Once | INTRAVENOUS | Status: AC
  Administered 2016-04-02: 4 mg via INTRAVENOUS
  Filled 2016-04-02: qty 1

## 2016-04-02 MED ORDER — SODIUM CHLORIDE 0.9 % WEIGHT BASED INFUSION
3.0000 mL/kg/h | INTRAVENOUS | Status: DC
  Administered 2016-04-02: 3 mL/kg/h via INTRAVENOUS

## 2016-04-02 MED ORDER — NITROGLYCERIN IN D5W 200-5 MCG/ML-% IV SOLN
2.0000 ug/min | INTRAVENOUS | Status: DC
Start: 2016-04-02 — End: 2016-04-03
  Administered 2016-04-02: 5 ug/min via INTRAVENOUS
  Filled 2016-04-02: qty 250

## 2016-04-02 MED ORDER — IOPAMIDOL (ISOVUE-370) INJECTION 76%
INTRAVENOUS | Status: AC
  Filled 2016-04-02: qty 100

## 2016-04-02 SURGICAL SUPPLY — 14 items
BALLN MAVERICK OTW 1.5X15 (BALLOONS) ×2
BALLOON MAVERICK OTW 1.5X15 (BALLOONS) IMPLANT
DEVICE RAD COMP TR BAND LRG (VASCULAR PRODUCTS) ×1 IMPLANT
GLIDESHEATH SLEND SS 6F .021 (SHEATH) ×1 IMPLANT
GUIDE CATH RUNWAY 6FR AL1 SH (CATHETERS) ×1 IMPLANT
GUIDEWIRE INQWIRE 1.5J.035X260 (WIRE) IMPLANT
INQWIRE 1.5J .035X260CM (WIRE) ×2
KIT ENCORE 26 ADVANTAGE (KITS) ×1 IMPLANT
KIT HEART LEFT (KITS) ×2 IMPLANT
PACK CARDIAC CATHETERIZATION (CUSTOM PROCEDURE TRAY) ×2 IMPLANT
STENT SYNERGY DES 2.25X38 (Permanent Stent) ×1 IMPLANT
TRANSDUCER W/STOPCOCK (MISCELLANEOUS) ×2 IMPLANT
TUBING CIL FLEX 10 FLL-RA (TUBING) ×2 IMPLANT
WIRE ASAHI FIELDER XT 300CM (WIRE) ×1 IMPLANT

## 2016-04-02 NOTE — Progress Notes (Signed)
Interventional Consult Note:      Patient ID: Sheena Rice MRN: [id number] DOB/AGE: 05/15/1940 76 y.o.  Admit date: 04/01/2016 Referring Physician: Aundra Dubin Primary Physician: Madelyn Brunner, MD Primary Cardiologist: Ubaldo Glassing Reason for Consultation: NSTEMI, failed PCI at outside hospital  HPI: 76 yo female with history of CAD s/p 4V CABG in December 2017 who was admitted to [facility] with c/o chest pain. Cardiac cath 04/01/16 at [facility] with occlusion of the sequential SVG to OM/Diagonal, patent LIMA to LAD, patent SVG to PDA but there is severe disease in the distal body of SVG to PDA and severe disease in the native PDA and PLA. Dr. Clayborn Bigness performed PCI of the distal RCA 04/01/16 but could not establish flow into the major distal RCA branches. Pt transferred to [facility]. She had chest pain overnight. Troponin mildly elevated. Dr. Aundra Dubin saw her this am and after review of her case, we elected to proceed with urgent cath today.    Past Medical History:  Diagnosis Date  . Arthritis    OA  . Chronic fatigue   . Coronary artery disease   . GERD (gastroesophageal reflux disease)   . Hypertension   . Unstable angina (HCC)     Family History:  Father:HTN  Social History   Social History  . Marital status: Married    Spouse name: N/A  . Number of children: N/A  . Years of education: N/A   Occupational History  . Not on file.   Social History Main Topics  . Smoking status: Never Smoker  . Smokeless tobacco: Never Used  . Alcohol use No  . Drug use: No  . Sexual activity: Not on file   Other Topics Concern  . Not on file   Social History Narrative  . No narrative on file    Past Surgical History:  Procedure Laterality Date  . APPENDECTOMY    . CARDIAC CATHETERIZATION Left 12/29/2015   Procedure: Left Heart Cath and Coronary Angiography;  Surgeon: Teodoro Spray, MD;  Location: [facility];  Service: Cardiovascular;  Laterality: Left;  . CATARACT EXTRACTION    .  CHOLECYSTECTOMY    . CORONARY ARTERY BYPASS GRAFT N/A 12/31/2015   Procedure: CORONARY ARTERY BYPASS GRAFTING (CABG), ON PUMP, TIMES FOUR, USING LEFT INTERNAL MAMMARY ARTERY, RIGHT GREATER SAPHENOUS VEIN HARVESTED ENDOSCOPICALLY;  Surgeon: [name], MD;  Location: [place];  Service: Open Heart Surgery;  Laterality: N/A;  -LIMA to LAD -SVG to DIAGONAL -SVG to OM -SVG to PDA  . TEE WITHOUT CARDIOVERSION N/A 12/31/2015   Procedure: TRANSESOPHAGEAL ECHOCARDIOGRAM (TEE);  Surgeon: [name], MD;  Location: [place];  Service: Open Heart Surgery;  Laterality: N/A;    Allergies  Allergen Reactions  . Pravastatin Other (See Comments)    Muscle pain "STATIN" Drugs   Hospital Meds:  . [MAR Hold] acidophilus  1 capsule Oral Daily  . aspirin  81 mg Oral Pre-Cath  . [MAR Hold] aspirin EC  81 mg Oral Daily  . [MAR Hold] cholecalciferol  5,000 Units Oral Daily  . [MAR Hold] colestipol  2 g Oral BID  . [MAR Hold] cyclobenzaprine  10 mg Oral QHS  . [MAR Hold] gemfibrozil  600 mg Oral BID AC  . [MAR Hold] metoprolol tartrate  25 mg Oral BID  . morphine      . [MAR Hold] pantoprazole  40 mg Oral Daily  . [MAR Hold] rosuvastatin  10 mg Oral q1800  . sodium chloride flush  3 mL Intravenous Q12H  . [MAR Hold] ticagrelor  90 mg Oral BID    Review of systems complete and found to be negative unless listed above    Physical Exam: Blood pressure 109/67, pulse 64, temperature 98 F (36.7 C), temperature source Oral, resp. rate 14, height 5' 4.5" (1.638 m), weight 133 lb (60.3 kg), SpO2 98 %.    General: Well developed, well nourished, NAD  HEENT: OP clear, mucus membranes moist  SKIN: warm, dry. No rashes.  Neuro: No focal deficits  Musculoskeletal: Muscle strength 5/5 all ext  Psychiatric: Mood and affect normal  Neck: No JVD, no carotid bruits, no thyromegaly, no lymphadenopathy.  Lungs:Clear bilaterally, no wheezes, rhonci, crackles  Cardiovascular: Regular rate and rhythm.  Systolic murmur noted. No gallops or rubs.  Abdomen:Soft. Bowel sounds present. Non-tender.  Extremities: No lower extremity edema. Pulses are 2 + in the bilateral DP/PT.   Labs:   Lab Results  Component Value Date   WBC 6.1 04/01/2016   HGB 13.3 04/01/2016   HCT 39.2 04/01/2016   MCV 89.5 04/01/2016   PLT 204 04/01/2016    Recent Labs Lab 04/01/16 2319  NA 139  K 4.3  CL 105  CO2 24  BUN 14  CREATININE 0.79  CALCIUM 9.0  PROT 6.8  BILITOT 0.7  ALKPHOS 73  ALT 19  AST 36  GLUCOSE 109*   Lab Results  Component Value Date   TROPONINI 0.83 (HH) 04/02/2016     EKG: sinus, lateral T wave inversion  ASSESSMENT AND PLAN:   1. CAD s/p CABG in December 2017 with NSTEMI: [facility]. sequental SVG to Diag and OM occluded. SVG to the tiny PDA has high grade disease at the anastomosis and in the native PDA and PLA. Review of old films shows that the native posterolateral artery was at least moderate in caliber. LIMA to LAD patent. PCI attempted at [facility] through the native RCA but despite balloon angioplasty distal RCA, no flow established into PDA or PLA. PCI aborted. She was transferred to [facility] to consider PCI. She had chest pain last night and this am. Troponin mildly elevated. She was seen this am by Dr. Aundra Dubin and I was called to consider PCI urgently today. I have reviewed her films and there are limited options for revascularization. Redo CABG is not an option as the targets are very small. The SVG to PDA leads into a very small PDA. PCI of the PDA through the SVG is not an option. I will attempt PCI through the native RCA into the PLA which is a 2.25 mm branch. I have spent 15 minutes reviewing her case with her family and outline the risk and likely poor angiographic outcome. They wish for me to proceed. The patient wishes for me to proceed with attempt at PCI.    SignedLauree Chandler 04/02/2016, 12:27 PM

## 2016-04-02 NOTE — Progress Notes (Signed)
ANTICOAGULATION CONSULT NOTE - Initial Consult  Pharmacy Consult for Heparin  Indication: chest pain/ACS  Allergies  Allergen Reactions  . Pravastatin Other (See Comments)    Muscle pain "STATIN" Drugs    Patient Measurements: Height: 5' 4.5" (163.8 cm) Weight: 134 lb 1.6 oz (60.8 kg) IBW/kg (Calculated) : 55.85  Vital Signs: Temp: 98 F (36.7 C) (03/17 0449) Temp Source: Oral (03/17 0449) BP: 109/67 (03/17 1000) Pulse Rate: 64 (03/17 1000)  Labs:  Recent Labs  03/31/16 2050  04/01/16 0450 04/01/16 2319 04/02/16 0442  HGB 13.4  --   --  13.3  --   HCT 38.8  --   --  39.2  --   PLT 209  --   --  204  --   APTT  --   --   --  35  --   LABPROT  --   --   --  13.5  --   INR  --   --   --  1.03  --   CREATININE 0.61  --   --  0.79  --   TROPONINI <0.03  < > 0.03* 0.11* 0.16*  < > = values in this interval not displayed.  Estimated Creatinine Clearance: 54.4 mL/min (by C-G formula based on SCr of 0.79 mg/dL).  Medical History: Past Medical History:  Diagnosis Date  . Arthritis    OA  . Chronic fatigue   . Coronary artery disease   . GERD (gastroesophageal reflux disease)   . Hypertension   . Unstable angina [facility])     Assessment: 76 y/o F transfer from [facility] s/p cath on 3/16. Pt also had recent CABG. Pt transferred for eval of high risk intervention. Heparin started 8 hours post sheath removal. Per [facility] notes, sheath removed ~1630 3/16. Heparin started ~0030 3/17. Cath lab activated for urgent coronary angiography around ~1100 3/17 due to new ST depression. Heparin level was 0.54 on 750 units/hr before patient transferred for possible cath.  Goal of Therapy:  Heparin level 0.3-0.7 units/ml Monitor platelets by anticoagulation protocol: Yes   Plan:  -f/u post-cath for [facility] plans  Melburn Popper 04/02/2016,10:17 AM

## 2016-04-02 NOTE — Progress Notes (Addendum)
Patient ID: Sheena Rice, female   DOB: [birth year], 76 y.o.   MRN: [id number]   SUBJECTIVE: Patient has had multiple chest pain episodes overnight.  Currently chest pain-free.  Mild troponin elevation to 0.16.   ECG (personally reviewed): NSR, lateral ST depression concerning for ischemia.   Scheduled Meds: . acidophilus  1 capsule Oral Daily  . aspirin EC  81 mg Oral Daily  . cholecalciferol  5,000 Units Oral Daily  . colestipol  2 g Oral BID  . cyclobenzaprine  10 mg Oral QHS  . gemfibrozil  600 mg Oral BID AC  . metoprolol tartrate  25 mg Oral BID  . morphine      . pantoprazole  40 mg Oral Daily  . ticagrelor  180 mg Oral Once  . ticagrelor  90 mg Oral BID   Continuous Infusions: . heparin 750 Units/hr (04/02/16 0800)  . nitroGLYCERIN 20 mcg/min (04/02/16 0930)   PRN Meds:.acetaminophen, ondansetron (ZOFRAN) IV    Vitals:   04/02/16 0928 04/02/16 0931 04/02/16 0935 04/02/16 1000  BP: (!) 149/68 (!) 147/71 130/72 109/67  Pulse:  61 64 64  Resp:  15 16 14   Temp:      TempSrc:      SpO2:  95% 96% 98%  Weight:      Height:        Intake/Output Summary (Last 24 hours) at 04/02/16 1112 Last data filed at 04/02/16 0800  Gross per 24 hour  Intake              2.8 ml  Output              400 ml  Net           -397.2 ml    LABS: Basic Metabolic Panel:  Recent Labs  03/31/16 2050 04/01/16 2319  NA 137 139  K 4.0 4.3  CL 103 105  CO2 24 24  GLUCOSE 124* 109*  BUN 22* 14  CREATININE 0.61 0.79  CALCIUM 9.8 9.0   Liver Function Tests:  Recent Labs  03/31/16 2050 04/01/16 2319  AST 32 36  ALT 18 19  ALKPHOS 90 73  BILITOT 0.5 0.7  PROT 7.6 6.8  ALBUMIN 3.9 3.4*   No results for input(s): LIPASE, AMYLASE in the last 72 hours. CBC:  Recent Labs  03/31/16 2050 04/01/16 2319  WBC 5.4 6.1  NEUTROABS  --  4.3  HGB 13.4 13.3  HCT 38.8 39.2  MCV 87.0 89.5  PLT 209 204   Cardiac Enzymes:  Recent Labs  04/01/16 0450 04/01/16 2319  04/02/16 0442  TROPONINI 0.03* 0.11* 0.16*   BNP: Invalid input(s): POCBNP D-Dimer: No results for input(s): DDIMER in the last 72 hours. Hemoglobin A1C: No results for input(s): HGBA1C in the last 72 hours. Fasting Lipid Panel: No results for input(s): CHOL, HDL, LDLCALC, TRIG, CHOLHDL, LDLDIRECT in the last 72 hours. Thyroid Function Tests: No results for input(s): TSH, T4TOTAL, T3FREE, THYROIDAB in the last 72 hours.  Invalid input(s): FREET3 Anemia Panel: No results for input(s): VITAMINB12, FOLATE, FERRITIN, TIBC, IRON, RETICCTPCT in the last 72 hours.  RADIOLOGY: Dg Chest 1 View  Result Date: 03/31/2016 CLINICAL DATA:  Chest pain EXAM: CHEST 1 VIEW COMPARISON:  02/02/2016 FINDINGS: Previous median sternotomy and CABG procedure. The heart size and mediastinal contours are within normal limits. Both lungs are clear. The visualized skeletal structures are unremarkable. IMPRESSION: No active disease. Electronically Signed   By: Queen Slough.D.  On: 03/31/2016 21:13    PHYSICAL EXAM General: NAD Neck: No JVD, no thyromegaly or thyroid nodule.  Lungs: Clear to auscultation bilaterally with normal respiratory effort. CV: Nondisplaced PMI.  Heart regular S1/S2, no S3/S4, no murmur.  No peripheral edema.   Abdomen: Soft, nontender, no hepatosplenomegaly, no distention.  Neurologic: Alert and oriented x 3.  Psych: Normal affect. Extremities: No clubbing or cyanosis.   TELEMETRY: Personally reviewed telemetry pt in NSR  ASSESSMENT AND PLAN: 76 yo with history of hyperlipidemia and CAD s/p CABG in 12/17 presented with NSTEMI.  1. CAD: Patient had CABG x 4 in 12/17 with LIMA-LAD, SVG-OM, SVG-D, SVG-PDA.  She developed chest pain this week similar to prior angina.  She was taken to cath lab at [facility] yesterday, found to have occluded SVG-D and SVG-OM, patent LIMA.  There was 99% stenosis of the distal SVG-PDA as well as disease in the native PDA which is quite small (occluded distal  RCA).  The PLV is a more substantial vessel but would be difficult to approach.  Unsuccessful attempt intervene on SVG-PDA, so patient was transferred to [facility] for attempt at complex intervention. Films reviewed today.  Patient has had ongoing CP episodes overnight despite metoprolol and NTG.  Currently CP-free but had an episode an hour or so ago.  TnI 0.16 suggestive of NSTEMI.   - Given unstable symptoms, I favor taking patient to cath lab this morning.  Discussed with Dr Angelena Form, will bring in cath lab.  Risks/benefits of procedure discussed with patient and family, she agrees to proceed.  This will be very difficult, as probably our only option will be to approach the PLV (with is a fairly substantial vessel) via native right.  PDA is likely too small and diseased to approach.  - Continue metropolol and NTG gtt. She is on heparin gtt.  - She is on ASA, will load with [name].  - Had myalgias with Zocor and pravastatin.  Will start Crestor 10 to see if she can tolerate.  If not, will need Praluent or Repatha.  - Will get echo today.  2. Hyperlipidemia: As above, trial of Crestor.   Loralie Champagne 04/02/2016 11:20 AM

## 2016-04-02 NOTE — Progress Notes (Signed)
     Cath lab activated for urgent coronary angiography. Dr. Angelena Form aware.   Angelena Form PA-C  [facility]

## 2016-04-02 NOTE — Interval H&P Note (Signed)
History and Physical Interval Note:  04/02/2016 12:21 PM  Sheena Rice  has presented today for surgery, with the diagnosis of CAD/Non-stemi  The various methods of treatment have been discussed with the patient and family. After consideration of risks, benefits and other options for treatment, the patient has consented to  Procedure(s): Right/Left Heart Cath and Coronary/Graft Angiography (N/A) as a surgical intervention .  The patient's history has been reviewed, patient examined, no change in status, stable for surgery.  I have reviewed the patient's chart and labs.  Questions were answered to the patient's satisfaction.  Cath Lab Visit (complete for each Cath Lab visit)  Clinical Evaluation Leading to the Procedure:   ACS: Yes.    Non-ACS:    Anginal Classification: CCS IV  Anti-ischemic medical therapy: Minimal Therapy (1 class of medications)  Non-Invasive Test Results: No non-invasive testing performed  Prior CABG: Previous CABG        Sheena Rice

## 2016-04-02 NOTE — Progress Notes (Signed)
Pt C/O left arm and chest pain rating 5/10. VSS. EKG obtained. Pain relived to level 0 after 1 Nitro administered. MD notified. Rapid Response also assessed pt. Will continue to monitor. Isac Caddy, RN

## 2016-04-02 NOTE — Progress Notes (Signed)
Pt having C/O left arm and chest pain rating 5/10. VSS. EKG obtained. Pt placed on 3L O2.. After 1 nitro administered pain level now 0. MD notified and also informed of critical troponin level of 0.11. Will continue to monitor. Isac Caddy, RN

## 2016-04-02 NOTE — Progress Notes (Signed)
TR band removed @ 17:45. Small amount bleeding at site. Pressure dressing applied. Will continue to monitor.

## 2016-04-02 NOTE — Progress Notes (Signed)
Pt having another episode of left arm and chest pain. VSS. 1 Nitro administered with relief to 0. MD notified. Orders placed Will continue to monitor. Isac Caddy, RN

## 2016-04-02 NOTE — H&P (View-Only) (Signed)
Patient ID: Sheena Rice, female   DOB: 1940/11/11, 76 y.o.   MRN: [id number]   SUBJECTIVE: Patient has had multiple chest pain episodes overnight.  Currently chest pain-free.  Mild troponin elevation to 0.16.   ECG (personally reviewed): NSR, lateral ST depression concerning for ischemia.   Scheduled Meds: . acidophilus  1 capsule Oral Daily  . aspirin EC  81 mg Oral Daily  . cholecalciferol  5,000 Units Oral Daily  . colestipol  2 g Oral BID  . cyclobenzaprine  10 mg Oral QHS  . gemfibrozil  600 mg Oral BID AC  . metoprolol tartrate  25 mg Oral BID  . morphine      . pantoprazole  40 mg Oral Daily  . ticagrelor  180 mg Oral Once  . ticagrelor  90 mg Oral BID   Continuous Infusions: . heparin 750 Units/hr (04/02/16 0800)  . nitroGLYCERIN 20 mcg/min (04/02/16 0930)   PRN Meds:.acetaminophen, ondansetron (ZOFRAN) IV    Vitals:   04/02/16 0928 04/02/16 0931 04/02/16 0935 04/02/16 1000  BP: (!) 149/68 (!) 147/71 130/72 109/67  Pulse:  61 64 64  Resp:  15 16 14   Temp:      TempSrc:      SpO2:  95% 96% 98%  Weight:      Height:        Intake/Output Summary (Last 24 hours) at 04/02/16 1112 Last data filed at 04/02/16 0800  Gross per 24 hour  Intake              2.8 ml  Output              400 ml  Net           -397.2 ml    LABS: Basic Metabolic Panel:  Recent Labs  03/31/16 2050 04/01/16 2319  NA 137 139  K 4.0 4.3  CL 103 105  CO2 24 24  GLUCOSE 124* 109*  BUN 22* 14  CREATININE 0.61 0.79  CALCIUM 9.8 9.0   Liver Function Tests:  Recent Labs  03/31/16 2050 04/01/16 2319  AST 32 36  ALT 18 19  ALKPHOS 90 73  BILITOT 0.5 0.7  PROT 7.6 6.8  ALBUMIN 3.9 3.4*   No results for input(s): LIPASE, AMYLASE in the last 72 hours. CBC:  Recent Labs  03/31/16 2050 04/01/16 2319  WBC 5.4 6.1  NEUTROABS  --  4.3  HGB 13.4 13.3  HCT 38.8 39.2  MCV 87.0 89.5  PLT 209 204   Cardiac Enzymes:  Recent Labs  04/01/16 0450 04/01/16 2319  04/02/16 0442  TROPONINI 0.03* 0.11* 0.16*   BNP: Invalid input(s): POCBNP D-Dimer: No results for input(s): DDIMER in the last 72 hours. Hemoglobin A1C: No results for input(s): HGBA1C in the last 72 hours. Fasting Lipid Panel: No results for input(s): CHOL, HDL, LDLCALC, TRIG, CHOLHDL, LDLDIRECT in the last 72 hours. Thyroid Function Tests: No results for input(s): TSH, T4TOTAL, T3FREE, THYROIDAB in the last 72 hours.  Invalid input(s): FREET3 Anemia Panel: No results for input(s): VITAMINB12, FOLATE, FERRITIN, TIBC, IRON, RETICCTPCT in the last 72 hours.  RADIOLOGY: Dg Chest 1 View  Result Date: 03/31/2016 CLINICAL DATA:  Chest pain EXAM: CHEST 1 VIEW COMPARISON:  02/02/2016 FINDINGS: Previous median sternotomy and CABG procedure. The heart size and mediastinal contours are within normal limits. Both lungs are clear. The visualized skeletal structures are unremarkable. IMPRESSION: No active disease. Electronically Signed   By: Queen Slough.D.  On: 03/31/2016 21:13    PHYSICAL EXAM General: NAD Neck: No JVD, no thyromegaly or thyroid nodule.  Lungs: Clear to auscultation bilaterally with normal respiratory effort. CV: Nondisplaced PMI.  Heart regular S1/S2, no S3/S4, no murmur.  No peripheral edema.   Abdomen: Soft, nontender, no hepatosplenomegaly, no distention.  Neurologic: Alert and oriented x 3.  Psych: Normal affect. Extremities: No clubbing or cyanosis.   TELEMETRY: Personally reviewed telemetry pt in NSR  ASSESSMENT AND PLAN: 76 yo with history of hyperlipidemia and CAD s/p CABG in 12/17 presented with NSTEMI.  1. CAD: Patient had CABG x 4 in 12/17 with LIMA-LAD, SVG-OM, SVG-D, SVG-PDA.  She developed chest pain this week similar to prior angina.  She was taken to cath lab at [facility] yesterday, found to have occluded SVG-D and SVG-OM, patent LIMA.  There was 99% stenosis of the distal SVG-PDA as well as disease in the native PDA which is quite small (occluded distal  RCA).  The PLV is a more substantial vessel but would be difficult to approach.  Unsuccessful attempt intervene on SVG-PDA, so patient was transferred to [facility] for attempt at complex intervention. Films reviewed today.  Patient has had ongoing CP episodes overnight despite metoprolol and NTG.  Currently CP-free but had an episode an hour or so ago.  TnI 0.16 suggestive of NSTEMI.   - Given unstable symptoms, I favor taking patient to cath lab this morning.  Discussed with Dr Angelena Form, will bring in cath lab.  Risks/benefits of procedure discussed with patient and family, she agrees to proceed.  This will be very difficult, as probably our only option will be to approach the PLV (with is a fairly substantial vessel) via native right.  PDA is likely too small and diseased to approach.  - Continue metropolol and NTG gtt. She is on heparin gtt.  - She is on ASA, will load with [name].  - Had myalgias with Zocor and pravastatin.  Will start Crestor 10 to see if she can tolerate.  If not, will need Praluent or Repatha.  - Will get echo today.  2. Hyperlipidemia: As above, trial of Crestor.   Loralie Champagne 04/02/2016 11:20 AM

## 2016-04-03 LAB — CBC
HCT: 32.7 % — ABNORMAL LOW (ref 36.0–46.0)
Hemoglobin: 10.7 g/dL — ABNORMAL LOW (ref 12.0–15.0)
MCH: 29.8 pg (ref 26.0–34.0)
MCHC: 32.7 g/dL (ref 30.0–36.0)
MCV: 91.1 fL (ref 78.0–100.0)
Platelets: 188 10*3/uL (ref 150–400)
RBC: 3.59 MIL/uL — ABNORMAL LOW (ref 3.87–5.11)
RDW: 13.3 % (ref 11.5–15.5)
WBC: 7.3 10*3/uL (ref 4.0–10.5)

## 2016-04-03 LAB — BASIC METABOLIC PANEL
Anion gap: 9 (ref 5–15)
BUN: 17 mg/dL (ref 6–20)
CALCIUM: 8.6 mg/dL — AB (ref 8.9–10.3)
CO2: 20 mmol/L — ABNORMAL LOW (ref 22–32)
CREATININE: 0.71 mg/dL (ref 0.44–1.00)
Chloride: 108 mmol/L (ref 101–111)
GFR calc Af Amer: >60 mL/min (ref >60)
GLUCOSE: 120 mg/dL — AB (ref 65–99)
Potassium: 4.5 mmol/L (ref 3.5–5.1)
Sodium: 137 mmol/L (ref 135–145)

## 2016-04-03 MED ORDER — ISOSORBIDE MONONITRATE ER 30 MG PO TB24
30.0000 mg | ORAL_TABLET | Freq: Every day | ORAL | Status: DC
Start: 2016-04-03 — End: 2016-04-04
  Administered 2016-04-03 – 2016-04-04 (×2): 30 mg via ORAL
  Filled 2016-04-03 (×2): qty 1

## 2016-04-03 MED ORDER — LISINOPRIL 2.5 MG PO TABS
2.5000 mg | ORAL_TABLET | Freq: Every day | ORAL | Status: DC
  Administered 2016-04-04: 2.5 mg via ORAL
  Filled 2016-04-03 (×2): qty 1

## 2016-04-03 MED ORDER — SALINE SPRAY 0.65 % NA SOLN
1.0000 | NASAL | Status: DC | PRN
  Filled 2016-04-03: qty 44

## 2016-04-03 NOTE — Progress Notes (Signed)
Patient ID: Sheena Rice, female   DOB: October 15, 1940, 76 y.o.   MRN: [id number]   SUBJECTIVE:  No chest pain.  Resting comfortably in bed.   PCI (3/18): CTO distal RCA into PLV opened with DES.  Residual disease present in small PDA.   Scheduled Meds: . acidophilus  1 capsule Oral Daily  . aspirin EC  81 mg Oral Daily  . cholecalciferol  5,000 Units Oral Daily  . colestipol  2 g Oral BID  . cyclobenzaprine  10 mg Oral QHS  . gemfibrozil  600 mg Oral BID AC  . isosorbide mononitrate  30 mg Oral Daily  . lisinopril  2.5 mg Oral Daily  . metoprolol tartrate  25 mg Oral BID  . pantoprazole  40 mg Oral Daily  . rosuvastatin  10 mg Oral q1800  . sodium chloride flush  3 mL Intravenous Q12H  . ticagrelor  90 mg Oral BID   Continuous Infusions:  PRN Meds:.sodium chloride, acetaminophen, ondansetron (ZOFRAN) IV, sodium chloride flush    Vitals:   04/03/16 0403 04/03/16 0500 04/03/16 0600 04/03/16 0700  BP: (!) 90/49 (!) 93/47 (!) 101/51 (!) 107/57  Pulse: 64 63 64 64  Resp: 16 14 14 19   Temp:      TempSrc:      SpO2: 100% 97% 96% 98%  Weight:      Height:        Intake/Output Summary (Last 24 hours) at 04/03/16 0737 Last data filed at 04/03/16 0600  Gross per 24 hour  Intake           821.34 ml  Output              975 ml  Net          -153.66 ml    LABS: Basic Metabolic Panel:  Recent Labs  04/01/16 2319 04/03/16 0237  NA 139 137  K 4.3 4.5  CL 105 108  CO2 24 20*  GLUCOSE 109* 120*  BUN 14 17  CREATININE 0.79 0.71  CALCIUM 9.0 8.6*   Liver Function Tests:  Recent Labs  03/31/16 2050 04/01/16 2319  AST 32 36  ALT 18 19  ALKPHOS 90 73  BILITOT 0.5 0.7  PROT 7.6 6.8  ALBUMIN 3.9 3.4*   No results for input(s): LIPASE, AMYLASE in the last 72 hours. CBC:  Recent Labs  04/01/16 2319 04/03/16 0237  WBC 6.1 7.3  NEUTROABS 4.3  --   HGB 13.3 10.7*  HCT 39.2 32.7*  MCV 89.5 91.1  PLT 204 188   Cardiac Enzymes:  Recent Labs  04/01/16 2319  04/02/16 0442 04/02/16 1046  TROPONINI 0.11* 0.16* 0.83*   BNP: Invalid input(s): POCBNP D-Dimer: No results for input(s): DDIMER in the last 72 hours. Hemoglobin A1C: No results for input(s): HGBA1C in the last 72 hours. Fasting Lipid Panel:  Recent Labs  04/02/16 1046  CHOL 199  HDL 35*  LDLCALC 139*  TRIG 127  CHOLHDL 5.7   Thyroid Function Tests: No results for input(s): TSH, T4TOTAL, T3FREE, THYROIDAB in the last 72 hours.  Invalid input(s): FREET3 Anemia Panel: No results for input(s): VITAMINB12, FOLATE, FERRITIN, TIBC, IRON, RETICCTPCT in the last 72 hours.  RADIOLOGY: Dg Chest 1 View  Result Date: 03/31/2016 CLINICAL DATA:  Chest pain EXAM: CHEST 1 VIEW COMPARISON:  02/02/2016 FINDINGS: Previous median sternotomy and CABG procedure. The heart size and mediastinal contours are within normal limits. Both lungs are clear. The visualized skeletal structures are unremarkable.  IMPRESSION: No active disease. Electronically Signed   By: Kerby Moors M.D.   On: 03/31/2016 21:13    PHYSICAL EXAM General: NAD Neck: No JVD, no thyromegaly or thyroid nodule.  Lungs: Clear to auscultation bilaterally with normal respiratory effort. CV: Nondisplaced PMI.  Heart regular S1/S2, no S3/S4, no murmur.  No peripheral edema.   Abdomen: Soft, nontender, no hepatosplenomegaly, no distention.  Neurologic: Alert and oriented x 3.  Psych: Normal affect. Extremities: No clubbing or cyanosis. Right radial cath site benign.   TELEMETRY: Personally reviewed telemetry pt in NSR  ASSESSMENT AND PLAN: 76 yo with history of hyperlipidemia and CAD s/p CABG in 12/17 presented with NSTEMI.  1. CAD: Patient had CABG x 4 in 12/17 with LIMA-LAD, SVG-OM, SVG-D, SVG-PDA.  She developed chest pain this week similar to prior angina.  She was taken to cath lab at [facility] 3/16, found to have occluded SVG-D and SVG-OM, patent LIMA.  There was 99% stenosis of the distal SVG-PDA as well as disease in the  native PDA which is quite small (occluded distal RCA).  Unsuccessful attempt intervene on SVG-PDA, so patient was transferred to [facility] for attempt at complex intervention.  Patient has had ongoing CP episodes despite metoprolol and NTG with TnI to 0.83. Patient had PCI to chronic total occlusion of distal RCA into PLV yesterday with DES x 1.  There was residual disease in a small PDA.  No chest pain overnight.  - Stop NTG, start Imdur 30 daily. - Add low dose ACEI.   - Continue metropolol.  - Continue ASA and [name].  - Had myalgias with Zocor and pravastatin.  I have started Crestor 10 to see if she can tolerate.  If not, will need Praluent or Repatha.  - Will get echo today.  - Out of bed to walk today.   2. Hyperlipidemia: As above, trial of Crestor.   Walk around today, will get echo, plan for discharge in morning.   Loralie Champagne 04/03/2016 7:37 AM

## 2016-04-04 ENCOUNTER — Encounter: Payer: Self-pay | Admitting: Internal Medicine

## 2016-04-04 ENCOUNTER — Inpatient Hospital Stay (HOSPITAL_COMMUNITY): Payer: Medicare HMO

## 2016-04-04 DIAGNOSIS — R072 Precordial pain: Secondary | ICD-10-CM

## 2016-04-04 DIAGNOSIS — I2510 Atherosclerotic heart disease of native coronary artery without angina pectoris: Secondary | ICD-10-CM

## 2016-04-04 LAB — BASIC METABOLIC PANEL
ANION GAP: 8 (ref 5–15)
BUN: 15 mg/dL (ref 6–20)
CALCIUM: 8.9 mg/dL (ref 8.9–10.3)
CO2: 24 mmol/L (ref 22–32)
CREATININE: 0.82 mg/dL (ref 0.44–1.00)
Chloride: 107 mmol/L (ref 101–111)
Glucose, Bld: 121 mg/dL — ABNORMAL HIGH (ref 65–99)
Potassium: 4.4 mmol/L (ref 3.5–5.1)
SODIUM: 139 mmol/L (ref 135–145)

## 2016-04-04 LAB — ECHOCARDIOGRAM COMPLETE
Height: 64.5 in
Weight: 2124.8 oz

## 2016-04-04 LAB — POCT ACTIVATED CLOTTING TIME
ACTIVATED CLOTTING TIME: 373 s
Activated Clotting Time: 296 seconds

## 2016-04-04 LAB — CBC
HCT: 31.6 % — ABNORMAL LOW (ref 36.0–46.0)
HEMOGLOBIN: 10.7 g/dL — AB (ref 12.0–15.0)
MCH: 30.2 pg (ref 26.0–34.0)
MCHC: 33.9 g/dL (ref 30.0–36.0)
MCV: 89.3 fL (ref 78.0–100.0)
Platelets: 176 10*3/uL (ref 150–400)
RBC: 3.54 MIL/uL — AB (ref 3.87–5.11)
RDW: 13 % (ref 11.5–15.5)
WBC: 7.6 10*3/uL (ref 4.0–10.5)

## 2016-04-04 MED ORDER — ROSUVASTATIN CALCIUM 10 MG PO TABS: 10.0000 mg | ORAL_TABLET | Freq: Every day | ORAL | 5 refills | Status: AC

## 2016-04-04 MED ORDER — ISOSORBIDE MONONITRATE ER 30 MG PO TB24: 30.0000 mg | ORAL_TABLET | Freq: Every day | ORAL | 5 refills | Status: DC

## 2016-04-04 MED ORDER — TICAGRELOR 90 MG PO TABS: 90.0000 mg | ORAL_TABLET | Freq: Two times a day (BID) | ORAL | 5 refills | Status: DC

## 2016-04-04 MED ORDER — LISINOPRIL 2.5 MG PO TABS: 2.5000 mg | ORAL_TABLET | Freq: Every day | ORAL | 5 refills | Status: DC

## 2016-04-04 NOTE — Discharge Summary (Signed)
Discharge Summary    Patient ID: Sheena Rice,  MRN: [id number], DOB/AGE: Jan 16, 1941 76 y.o.  Admit date: 04/01/2016 Discharge date: 04/04/2016  Primary Care Provider: Lynett Fish B Primary Cardiologist: Dr. Ubaldo Glassing  Discharge Diagnoses    Active Problems:   Unstable angina [facility])   Coronary artery disease involving native coronary artery of native heart with unstable angina pectoris (HCC)   Allergies Allergies  Allergen Reactions  . Pravastatin Other (See Comments)    Muscle pain,"STATIN" Drugs    Diagnostic Studies/Procedures    Cardiac cath 04/02/2016   A STENT SYNERGY DES 2.25X38 drug eluting stent was successfully placed.  Post Atrio lesion, 100 %stenosed.  Post intervention, there is a 0% residual stenosis.  Dist RCA lesion, 100 %stenosed.  Post intervention, there is a 0% residual stenosis.  A stent was successfully placed.  Ost RPDA to RPDA lesion, 80 %stenosed.   1. Chronic total occlusion distal RCA 2. NSTEMI/unstable angina 3. Successful PCI of the CTO with placement of a drug eluting stent x 1.  4. Residual moderate disease distally in the posterolateral branch distal to the newly placed stent.   Recommendations: Will continue DAPT with ASA and [name] for at least one year. Continue beta blocker. Please see cath note from [facility] Dr. Clayborn Bigness 04/01/16 for other details of coronary anatomy. CCU tonight. Stop IV heparin.   _____________  Echo  04/04/16  Study Conclusions  - Left ventricle: The cavity size was normal. Systolic function was   normal. The estimated ejection fraction was in the range of 60%   to 65%. Wall motion was normal; there were no regional wall   motion abnormalities. Features are consistent with a pseudonormal   left ventricular filling pattern, with concomitant abnormal   relaxation and increased filling pressure (grade 2 diastolic   dysfunction). Doppler parameters are consistent with high   ventricular filling  pressure. - Aortic valve: Transvalvular velocity was within the normal range.   There was no stenosis. There was no regurgitation. - Mitral valve: Transvalvular velocity was within the normal range.   There was no evidence for stenosis. There was mild regurgitation. - Right ventricle: Systolic function was normal. - Tricuspid valve: There was mild-moderate regurgitation. - Pulmonary arteries: Systolic pressure was mildly increased. PA   peak pressure: 41 mm Hg (S).  History of Present Illness     76 y.o. female hx of CAD (s/p CABG (LIMA to LAD; SVG to OM; SVG to D; SVG to PDA in 12/17) Presented with NSTEMI.   Hospital Course     Consultants: None  Cardiac cath 04/01/16 at [facility] with occlusion of the sequential SVG to OM/Diagonal, patent LIMA to LAD, patent SVG to PDA but there is severe disease in the distal body of SVG to PDA and severe disease in the native PDA and PLA. Dr. Clayborn Bigness performed PCI of the distal RCA 04/01/16 but could not establish flow into the major distal RCA branches. Pt transferred to [facility]. She had chest pain overnight. Troponin mildly elevated. Dr. Aundra Dubin saw her and after review of her case, they elected to proceed with urgent cath. Underwent complex intervention with PCI and DES to distal RCA. Continue ASA and [name]. Imdur 30 mg and low dose ACE-I were added. Continue BB. Will re-attempt Crestor 10 mg and see if she can tolerate. If not, consider PCSK9 Inhibitor.   Echo showed normal LV EF 60-65%, grade 2 DD, normal wall motion  Patient has been seen by Dr.  Harrington Challenger today and deemed ready for discharge home. All follow up appointments have been scheduled. Discharge medications are listed below.  _____________  Discharge Vitals Blood pressure (!) 102/54, pulse 67, temperature 98.2 F (36.8 C), temperature source Oral, resp. rate 18, height 5' 4.5" (1.638 m), weight 132 lb 12.8 oz (60.2 kg), SpO2 95 %.  Filed Weights   04/01/16 2150 04/02/16 0920 04/04/16 0500    Weight: 134 lb 1.6 oz (60.8 kg) 133 lb (60.3 kg) 132 lb 12.8 oz (60.2 kg)    Labs & Radiologic Studies    CBC  Recent Labs  04/01/16 2319 04/03/16 0237 04/04/16 0309  WBC 6.1 7.3 7.6  NEUTROABS 4.3  --   --   HGB 13.3 10.7* 10.7*  HCT 39.2 32.7* 31.6*  MCV 89.5 91.1 89.3  PLT 204 188 902   Basic Metabolic Panel  Recent Labs  04/03/16 0237 04/04/16 0309  NA 137 139  K 4.5 4.4  CL 108 107  CO2 20* 24  GLUCOSE 120* 121*  BUN 17 15  CREATININE 0.71 0.82  CALCIUM 8.6* 8.9   Liver Function Tests  Recent Labs  04/01/16 2319  AST 36  ALT 19  ALKPHOS 73  BILITOT 0.7  PROT 6.8  ALBUMIN 3.4*   No results for input(s): LIPASE, AMYLASE in the last 72 hours. Cardiac Enzymes  Recent Labs  04/01/16 2319 04/02/16 0442 04/02/16 1046  TROPONINI 0.11* 0.16* 0.83*   BNP Invalid input(s): POCBNP D-Dimer No results for input(s): DDIMER in the last 72 hours. Hemoglobin A1C No results for input(s): HGBA1C in the last 72 hours. Fasting Lipid Panel  Recent Labs  04/02/16 1046  CHOL 199  HDL 35*  LDLCALC 139*  TRIG 127  CHOLHDL 5.7   Thyroid Function Tests No results for input(s): TSH, T4TOTAL, T3FREE, THYROIDAB in the last 72 hours.  Invalid input(s): FREET3 _____________  Dg Chest 1 View  Result Date: 03/31/2016 CLINICAL DATA:  Chest pain EXAM: CHEST 1 VIEW COMPARISON:  02/02/2016 FINDINGS: Previous median sternotomy and CABG procedure. The heart size and mediastinal contours are within normal limits. Both lungs are clear. The visualized skeletal structures are unremarkable. IMPRESSION: No active disease. Electronically Signed   By: Kerby Moors M.D.   On: 03/31/2016 21:13   Disposition   Pt is being discharged home today in good condition.  Follow-up Plans & Appointments    Follow-up Information    Teodoro Spray, MD Follow up on 04/08/2016.   Specialty:  Cardiology Why:  at 9:30 for cardiology hospital follow up. Please brings all of your  medications to this apoointment.  Contact information: [place] [address] [phone]            Discharge Medications   Current Discharge Medication List    START taking these medications   Details  isosorbide mononitrate (IMDUR) 30 MG 24 hr tablet Take 1 tablet (30 mg total) by mouth daily. Qty: 30 tablet, Refills: 5    lisinopril (PRINIVIL,ZESTRIL) 2.5 MG tablet Take 1 tablet (2.5 mg total) by mouth daily. Qty: 30 tablet, Refills: 5    rosuvastatin (CRESTOR) 10 MG tablet Take 1 tablet (10 mg total) by mouth daily at 6 PM. Qty: 30 tablet, Refills: 5    ticagrelor ([name]) 90 MG TABS tablet Take 1 tablet (90 mg total) by mouth 2 (two) times daily. Qty: 60 tablet, Refills: 5      CONTINUE these medications which have NOT  CHANGED   Details  aspirin EC 81 MG EC tablet Take 1 tablet (81 mg total) by mouth daily. Qty: 30 tablet, Refills: 0    Cholecalciferol (VITAMIN D3) 5000 units CAPS Take 5,000 Units by mouth daily.     co-enzyme Q-10 30 MG capsule Take 300 mg by mouth daily.    colestipol (COLESTID) 1 g tablet Take 2 g by mouth 2 (two) times daily.     cyclobenzaprine (FLEXERIL) 10 MG tablet Take 10 mg by mouth at bedtime.     esomeprazole (NEXIUM) 20 MG capsule Take 20 mg by mouth every morning.     Flaxseed, Linseed, (FLAX SEED OIL PO) Take 1,400 mg by mouth daily.    gemfibrozil (LOPID) 600 MG tablet Take 600 mg by mouth 2 (two) times daily.     Magnesium Oxide 400 MG CAPS Take 400 mg by mouth 2 (two) times daily.     metoprolol tartrate (LOPRESSOR) 25 MG tablet Take 1 tablet (25 mg total) by mouth 2 (two) times daily. Qty: 30 tablet, Refills: 3    Probiotic Product (PROBIOTIC DAILY) CAPS Take 1 capsule by mouth daily.         Aspirin prescribed at discharge?  Yes High Intensity Statin Prescribed? (Lipitor 40-80mg  or Crestor 20-40mg ): Yes Beta Blocker Prescribed? Yes For EF <40%, was  ACEI/ARB Prescribed? Yes ADP Receptor Inhibitor Prescribed? (i.e. Plavix etc.-Includes Medically Managed Patients): Yes For EF <40%, Aldosterone Inhibitor Prescribed? No: Normal EF Was EF assessed during THIS hospitalization? Yes Was Cardiac Rehab II ordered? (Included Medically managed Patients): No: Currently in CR   Outstanding Labs/Studies     Duration of Discharge Encounter   Greater than 30 minutes including physician time.  Signed, Daune Perch NP 04/04/2016, 9:32 AM

## 2016-04-04 NOTE — Plan of Care (Signed)
Problem: Activity: Goal: Risk for activity intolerance will decrease Outcome: Completed/Met Date Met: 04/04/16 Patient able to ambulate in the room, walked in halls for 400 ft with Cardiac rehab. Tolerated well.

## 2016-04-04 NOTE — Plan of Care (Signed)
Problem: Pain Managment: Goal: General experience of comfort will improve Outcome: Not Applicable Date Met: 86/16/83 Patient has had no issues with pain .

## 2016-04-04 NOTE — Progress Notes (Signed)
CARDIAC REHAB PHASE I   PRE:  Rate/Rhythm: 17 SR c/ PVCs  BP:  Sitting: 113/56        SaO2: 94 RA  MODE:  Ambulation: 400 ft   POST:  Rate/Rhythm: 80 SR c/ PVCs  BP:  Sitting: 131/64         SaO2: 99 RA  Pt ambulated 400 ft on RA, handheld assist, steady gait, tolerated well.  Pt c/o of some weakness in her legs with distance, denies cp, dizziness, declined rest stop. Completed MI/stent education with pt and family at bedside.  Reviewed risk factors, anti-platelet therapy, stent card, activity restrictions, ntg, exercise, heart healthy diet, and phase 2 cardiac rehab. Pt and family verbalized understanding. Pt currently in phase 2 cardiac rehab, will send letter with update. Pt advised to discuss with MD when she may return to exercise. Pt to see case manager regarding brilinta prior to discharge. Pt to recliner after walk, call bell within reach. Will follow.      [phone] Lenna Sciara, RN, BSN 04/04/2016 10:36 AM

## 2016-04-04 NOTE — Progress Notes (Signed)
Echocardiogram 2D Echocardiogram has been performed.  Sheena Rice 04/04/2016, 2:36 PM

## 2016-04-04 NOTE — Progress Notes (Signed)
Progress Note  Patient Name: Sheena Rice Date of Encounter: 04/04/2016  Primary Cardiologist: Ubaldo Glassing    Subjective   No CP  No signif SOB  A little tired after walking    Inpatient Medications    Scheduled Meds: . acidophilus  1 capsule Oral Daily  . aspirin EC  81 mg Oral Daily  . cholecalciferol  5,000 Units Oral Daily  . colestipol  2 g Oral BID  . cyclobenzaprine  10 mg Oral QHS  . gemfibrozil  600 mg Oral BID AC  . isosorbide mononitrate  30 mg Oral Daily  . lisinopril  2.5 mg Oral Daily  . metoprolol tartrate  25 mg Oral BID  . pantoprazole  40 mg Oral Daily  . rosuvastatin  10 mg Oral q1800  . sodium chloride flush  3 mL Intravenous Q12H  . ticagrelor  90 mg Oral BID   Continuous Infusions:  PRN Meds: sodium chloride, acetaminophen, ondansetron (ZOFRAN) IV, sodium chloride, sodium chloride flush   Vital Signs    Vitals:   04/03/16 1445 04/03/16 2026 04/03/16 2233 04/04/16 0500  BP: (!) 105/38 (!) 112/49 (!) 121/54 (!) 102/54  Pulse: 67 69 70 67  Resp: 18 18  18   Temp: 98.3 F (36.8 C) 98.4 F (36.9 C)  98.2 F (36.8 C)  TempSrc: Oral Oral  Oral  SpO2: 98% 97%  95%  Weight:    132 lb 12.8 oz (60.2 kg)  Height:        Intake/Output Summary (Last 24 hours) at 04/04/16 0742 Last data filed at 04/03/16 2025  Gross per 24 hour  Intake           380.43 ml  Output                0 ml  Net           380.43 ml   Filed Weights   04/01/16 2150 04/02/16 0920 04/04/16 0500  Weight: 134 lb 1.6 oz (60.8 kg) 133 lb (60.3 kg) 132 lb 12.8 oz (60.2 kg)    Telemetry    SR with PVCs - Personally Reviewed  ECG      Physical Exam   GEN: No acute distress.   Neck: No JVD Cardiac: RRR, no murmurs, rubs, or gallops.  Respiratory: Clear to auscultation bilaterally. GI: Soft, nontender, non-distended  MS: No edema; No deformity. Neuro:  Nonfocal  Psych: Normal affect   Labs    Chemistry Recent Labs Lab 03/31/16 2050 04/01/16 2319 04/03/16 0237  04/04/16 0309  NA 137 139 137 139  K 4.0 4.3 4.5 4.4  CL 103 105 108 107  CO2 24 24 20* 24  GLUCOSE 124* 109* 120* 121*  BUN 22* 14 17 15   CREATININE 0.61 0.79 0.71 0.82  CALCIUM 9.8 9.0 8.6* 8.9  PROT 7.6 6.8  --   --   ALBUMIN 3.9 3.4*  --   --   AST 32 36  --   --   ALT 18 19  --   --   ALKPHOS 90 73  --   --   BILITOT 0.5 0.7  --   --   GFRNONAA >60 >60 >60 >60  GFRAA >60 >60 >60 >60  ANIONGAP 10 10 9 8      Hematology Recent Labs Lab 04/01/16 2319 04/03/16 0237 04/04/16 0309  WBC 6.1 7.3 7.6  RBC 4.38 3.59* 3.54*  HGB 13.3 10.7* 10.7*  HCT 39.2 32.7* 31.6*  MCV 89.5  91.1 89.3  MCH 30.4 29.8 30.2  MCHC 33.9 32.7 33.9  RDW 13.1 13.3 13.0  PLT 204 188 176    Cardiac Enzymes Recent Labs Lab 04/01/16 0450 04/01/16 2319 04/02/16 0442 04/02/16 1046  TROPONINI 0.03* 0.11* 0.16* 0.83*   No results for input(s): TROPIPOC in the last 168 hours.   BNP Recent Labs Lab 04/01/16 2319  BNP 381.6*     DDimer No results for input(s): DDIMER in the last 168 hours.   Radiology    No results found.  Cardiac Studies   Echo pending    Patient Profile     76 y.o. female hx of CAD (s/p CABG (LIMA to LAD; SVG to OM; SVG to D; SVG to PDA in 12/17) Presented with NSTEMI.   Assessment & Plan    1  CAD  s/p NSTEMI    Cath at [facility] with occlusion of SVG to D and SVG to OM  99% distal SVG to PDA    PDA small  Underwent [facility] intervention at[facility] with PCI and DES to distal RCA  Continue ASA and [name] WIll get echo today then d/c  Close f/u with Dr Ubaldo Glassing    2  HL  Myalgias on Zocor and pravastatin  Crestor started  Also on Lopid  F/U with Dr Ubaldo Glassing       Signed, Dorris Carnes, MD  04/04/2016, 7:42 AM

## 2016-04-04 NOTE — Care Management (Signed)
[id number] 04-04-16 benefits check completed for [name]. RN to provide pt with 30 day free card and cost.  S/W VANESSA @ [employer] # [phone]   [name] 90 MG BID   COVER- YES  CO-PAY- $ 47.00  TIER 3 DRUG  PRIOR APPROVAL- NO   PHARMACY : [facility] AND [facility]   MAIL-ORDER FOR 90 DAY SUPPLY $ 131.00   TICAGRELOR -NOT ON FORMULARY

## 2016-04-06 ENCOUNTER — Telehealth: Payer: Self-pay | Admitting: *Deleted

## 2016-04-06 ENCOUNTER — Encounter: Payer: Self-pay | Admitting: *Deleted

## 2016-04-06 DIAGNOSIS — Z951 Presence of aortocoronary bypass graft: Secondary | ICD-10-CM

## 2016-04-06 NOTE — Telephone Encounter (Signed)
Sheena Rice was hospitalized for another MI and stent placement. She was discharged on 3/19 and has a follow up on 3/23 with Dr. Ubaldo Glassing.  I told her that we needed clearance to return and will reduced her workloads upon return.

## 2016-04-08 DIAGNOSIS — I249 Acute ischemic heart disease, unspecified: Secondary | ICD-10-CM | POA: Diagnosis not present

## 2016-04-08 DIAGNOSIS — I10 Essential (primary) hypertension: Secondary | ICD-10-CM | POA: Diagnosis not present

## 2016-04-08 DIAGNOSIS — I2510 Atherosclerotic heart disease of native coronary artery without angina pectoris: Secondary | ICD-10-CM | POA: Diagnosis not present

## 2016-04-12 ENCOUNTER — Encounter: Payer: Self-pay | Admitting: *Deleted

## 2016-04-12 ENCOUNTER — Telehealth: Payer: Self-pay | Admitting: *Deleted

## 2016-04-12 DIAGNOSIS — Z951 Presence of aortocoronary bypass graft: Secondary | ICD-10-CM

## 2016-04-12 NOTE — Telephone Encounter (Signed)
We have received Sheena Rice's clearance to return to rehab.  She will be returning on April 3.  Called to let her know.

## 2016-04-13 ENCOUNTER — Encounter: Payer: Self-pay | Admitting: *Deleted

## 2016-04-13 DIAGNOSIS — Z951 Presence of aortocoronary bypass graft: Secondary | ICD-10-CM

## 2016-04-13 NOTE — Progress Notes (Signed)
Cardiac Individual Treatment Plan  Patient Details  Name: Sheena Rice MRN: [id number] Date of Birth: 02/09/1940 Referring Provider:     Cardiac Rehab from 02/15/2016 in [facility] Cardiac and Pulmonary Rehab  Referring Provider  Bartholome Bill MD      Initial Encounter Date:    Cardiac Rehab from 02/15/2016 in [facility] Cardiac and Pulmonary Rehab  Date  02/15/16  Referring Provider  Bartholome Bill MD      Visit Diagnosis: S/P CABG x 4  Patient's Home Medications on Admission:  Current Outpatient Prescriptions:  .  aspirin EC 81 MG EC tablet, Take 1 tablet (81 mg total) by mouth daily., Disp: 30 tablet, Rfl: 0 .  Cholecalciferol (VITAMIN D3) 5000 units CAPS, Take 5,000 Units by mouth daily. , Disp: , Rfl:  .  co-enzyme Q-10 30 MG capsule, Take 300 mg by mouth daily., Disp: , Rfl:  .  colestipol (COLESTID) 1 g tablet, Take 2 g by mouth 2 (two) times daily. , Disp: , Rfl:  .  cyclobenzaprine (FLEXERIL) 10 MG tablet, Take 10 mg by mouth at bedtime. , Disp: , Rfl:  .  esomeprazole (NEXIUM) 20 MG capsule, Take 20 mg by mouth every morning. , Disp: , Rfl:  .  Flaxseed, Linseed, (FLAX SEED OIL PO), Take 1,400 mg by mouth daily., Disp: , Rfl:  .  gemfibrozil (LOPID) 600 MG tablet, Take 600 mg by mouth 2 (two) times daily. , Disp: , Rfl:  .  isosorbide mononitrate (IMDUR) 30 MG 24 hr tablet, Take 1 tablet (30 mg total) by mouth daily., Disp: 30 tablet, Rfl: 5 .  lisinopril (PRINIVIL,ZESTRIL) 2.5 MG tablet, Take 1 tablet (2.5 mg total) by mouth daily., Disp: 30 tablet, Rfl: 5 .  Magnesium Oxide 400 MG CAPS, Take 400 mg by mouth 2 (two) times daily. , Disp: , Rfl:  .  metoprolol tartrate (LOPRESSOR) 25 MG tablet, Take 1 tablet (25 mg total) by mouth 2 (two) times daily., Disp: 30 tablet, Rfl: 3 .  Probiotic Product (PROBIOTIC DAILY) CAPS, Take 1 capsule by mouth daily., Disp: , Rfl:  .  rosuvastatin (CRESTOR) 10 MG tablet, Take 1 tablet (10 mg total) by mouth daily at 6 PM., Disp: 30 tablet, Rfl: 5 .   ticagrelor ([name]) 90 MG TABS tablet, Take 1 tablet (90 mg total) by mouth 2 (two) times daily., Disp: 60 tablet, Rfl: 5 No current facility-administered medications for this visit.   Facility-Administered Medications Ordered in Other Visits:  .  0.9 %  sodium chloride infusion, 250 mL, Intravenous, PRN, Teodoro Spray, MD .  [EXPIRED] 0.9% sodium chloride infusion, 3 mL/kg/hr, Intravenous, Continuous **FOLLOWED BY** 0.9% sodium chloride infusion, 1 mL/kg/hr, Intravenous, Continuous, Teodoro Spray, MD .  sodium chloride flush (NS) 0.9 % injection 3 mL, 3 mL, Intravenous, Q12H, Teodoro Spray, MD .  sodium chloride flush (NS) 0.9 % injection 3 mL, 3 mL, Intravenous, PRN, Teodoro Spray, MD  Past Medical History: Past Medical History:  Diagnosis Date  . Arthritis    OA  . Chronic fatigue   . Coronary artery disease   . GERD (gastroesophageal reflux disease)   . Hypertension   . Unstable angina (HCC)     Tobacco Use: History  Smoking Status  . Never Smoker  Smokeless Tobacco  . Never Used    Labs: Recent Review Flowsheet Data    Labs for ITP Cardiac and Pulmonary Rehab Latest Ref Rng & Units 12/31/2015 12/31/2015 12/31/2015 01/01/2016 04/02/2016   Cholestrol  0 - 200 mg/dL - - - - 199   LDLCALC 0 - 99 mg/dL - - - - 139(H)   HDL >40 mg/dL - - - - 35(L)   Trlycerides <150 mg/dL - - - - 127   Hemoglobin A1c 4.8 - 5.6 % - - - - -   PHART 7.350 - 7.450 7.325(L) - 7.306(L) - -   PCO2ART 32.0 - 48.0 mmHg 42.5 - 41.8 - -   HCO3 20.0 - 28.0 mmol/L 22.3 - 21.0 - -   TCO2 0 - 100 mmol/L _0 -   ACIDBASEDEF 0.0 - 2.0 mmol/L 4.0(H) - 5.0(H) - -   O2SAT % 97.0 - 97.0 - -       Exercise Target Goals:    Exercise Program Goal: Individual exercise prescription set with THRR, safety & activity barriers. Participant demonstrates ability to understand and report RPE using BORG scale, to self-measure pulse accurately, and to acknowledge the importance of the exercise  prescription.  Exercise Prescription Goal: Starting with aerobic activity 30 plus minutes a day, 3 days per week for initial exercise prescription. Provide home exercise prescription and guidelines that participant acknowledges understanding prior to discharge.  Activity Barriers & Risk Stratification:     Activity Barriers & Cardiac Risk Stratification - 02/15/16 1236      Activity Barriers & Cardiac Risk Stratification   Activity Barriers Deconditioning;Muscular Weakness;Incisional Pain;Other (comment)   Comments Chronic Fatigue Syndrome but she crossed out fibromyalgia   Cardiac Risk Stratification High      6 Minute Walk:     6 Minute Walk    Row Name 02/15/16 1526         6 Minute Walk   Phase Initial     Distance 1260 feet     Walk Time 6 minutes     # of Rest Breaks 0     MPH 2.39     METS 2.77     RPE 13     VO2 Peak 1260     Symptoms No     Resting HR 64 bpm     Resting BP 138/62     Max Ex. HR 88 bpm     Max Ex. BP 128/60     2 Minute Post BP 122/60        Oxygen Initial Assessment:   Oxygen Re-Evaluation:   Oxygen Discharge (Final Oxygen Re-Evaluation):   Initial Exercise Prescription:     Initial Exercise Prescription - 02/15/16 1500      Date of Initial Exercise RX and Referring Provider   Date 02/15/16   Referring Provider Bartholome Bill MD     Treadmill   MPH 2.3   Grade 0   Minutes 15   METs 2.76     Bike   Level 0.5   Minutes 15   METs 2     NuStep   Level 2   Minutes 15   METs 2     Biostep-RELP   Level 2   Minutes 15   METs 2     Prescription Details   Frequency (times per week) 2   Duration Progress to 45 minutes of aerobic exercise without signs/symptoms of physical distress     Intensity   THRR 40-80% of Max Heartrate 97-130   Ratings of Perceived Exertion 11-15   Perceived Dyspnea 0-4     Progression   Progression Continue to progress workloads to maintain intensity without signs/symptoms of physical  distress.  Resistance Training   Training Prescription Yes   Weight 2 lbs   Reps 10-15      Perform Capillary Blood Glucose checks as needed.  Exercise Prescription Changes:     Exercise Prescription Changes    Row Name 02/15/16 1500 02/24/16 1500 03/09/16 1500 03/23/16 1500 04/06/16 1000     Response to Exercise   Blood Pressure (Admit) 138/62 128/74 128/60 134/62 118/64   Blood Pressure (Exercise) 128/60 160/80 134/64 140/66 138/64   Blood Pressure (Exit) 122/60 126/64 144/62 134/70 112/54   Heart Rate (Admit) 64 bpm 74 bpm 76 bpm 76 bpm 73 bpm   Heart Rate (Exercise) 88 bpm 98 bpm 119 bpm 116 bpm 80 bpm   Heart Rate (Exit) 70 bpm 70 bpm 62 bpm 75 bpm 64 bpm   Oxygen Saturation (Admit) 93 %  -  -  -  -   Oxygen Saturation (Exercise) 100 %  -  -  -  -   Rating of Perceived Exertion (Exercise) _0 Symptoms none none none none L arm pain and SOB   Comments  -  -  -  - later that evening she went to ER with MI   Duration  - Progress to 30 minutes of continuous aerobic without signs/symptoms of physical distress Progress to 45 minutes of aerobic exercise without signs/symptoms of physical distress Continue with 45 min of aerobic exercise without signs/symptoms of physical distress. Progress to 45 minutes of aerobic exercise without signs/symptoms of physical distress   Intensity  - THRR unchanged THRR unchanged THRR unchanged THRR unchanged     Progression   Progression  - Continue to progress workloads to maintain intensity without signs/symptoms of physical distress. Continue to progress workloads to maintain intensity without signs/symptoms of physical distress. Continue to progress workloads to maintain intensity without signs/symptoms of physical distress. Continue to progress workloads to maintain intensity without signs/symptoms of physical distress.   Average METs  - 2.49 3.65 5.82 3.1     Resistance Training   Training Prescription  - Yes Yes Yes Yes    Weight  - 2 lbs 2 lbs 2 lbs 2 lbs   Reps  - 10-15 10-15 10-15 10-15     Interval Training   Interval Training  - No No Yes Yes   Equipment  -  -  - REL-XR REL-XR   Comments  -  -  - Added some intervals on XR  Added some intervals on XR      Treadmill   MPH  - 2.3 2.5 2.6 2   Grade  - 0 _1 Minutes  - _2 METs  - 2.76 3.24 3.35 2.81     Bike   Level  - 0.5  -  -  -   Minutes  - 15  -  -  -   METs  - 2.5  -  -  -     NuStep   Level  - _3 Minutes  - _4 METs  - 2.4 3.1 3.1 2.9     REL-XR   Level  -  - _5 Minutes  -  - _6 METs  -  - 4.6 10.1 3.6     Home Exercise Plan   Plans to continue exercise at  -  -  -  Home (comment)  walking two laps Home (comment)  walking two laps   Frequency  -  -  - Add 3 additional days to program exercise sessions. Add 3 additional days to program exercise sessions.   Initial Home Exercises Provided  -  -  - 03/15/16 03/15/16     Exercise Review   Progression -  walk test results Yes Yes  -  -      Exercise Comments:     Exercise Comments    Row Name 02/18/16 7673 02/24/16 1538 03/09/16 1538 03/17/16 1040 03/24/16 0955   Exercise Comments First full day of exercise!  Patient was oriented to gym and equipment including functions, settings, policies, and procedures.  Patient's individual exercise prescription and treatment plan were reviewed.  All starting workloads were established based on the results of the 6 minute walk test done at initial orientation visit.  The plan for exercise progression was also introduced and progression will be customized based on patient's performance and goals. Adela Lank is off to a good start in rehab.  She has mentioned that she was tired after her first day and needed a nap.  I encouraged her that this is normal and hopefully her stamina will improve.  We will continue to monitor her progression. Adela Lank has continued to do well in rehab.  She is still getting her nap,  but feels that she has earned her nap after rehab.  She has traded the bike out for the recumbent elliptical and finds it more comfortable but challenging.  We will continue to monitor her progress. MET levels reviewed today. Reviewed METs average and discussed progression with pt today.   [place] Name 03/31/16 1116 04/01/16 1027 04/01/16 1031       Exercise Comments Smt. reported chest/arm pain on the NS.  Symptoms went away with rest.  All vitals were within normal limits.  Symptoms did not reoccur on the TM or seated elliptical (XR).  Lovelle was instructed to call her Dr if symptoms return. Zellie reported chest/arm pain on the NS. RN's P. Surles and K. Spencer noted ST depressions on tele which were also present at her prior sessions.  All vitals were within normal limits. Symptoms went away with rest. Symptoms did not reoccur on the TM or seated elliptical (XR).  Doriann was instructed to call her Dr if symptoms return. Blyss reported chest/arm pain on the NS. RN's P. Surles and K. Spencer noted ST depressions on tele which were also present at her prior sessions.  All vitals were within normal limits. Symptoms went away with rest. Symptoms did not reoccur on the TM or seated elliptical (XR).  Mariaha was instructed to call her Dr if symptoms return.        Exercise Goals and Review:   Exercise Goals Re-Evaluation :     Exercise Goals Re-Evaluation    Row Name 03/23/16 1544 04/06/16 1051           Exercise Goal Re-Evaluation   Exercise Goals Review Increase Physical Activity;Increase Strenth and Stamina Increase Physical Activity;Increase Strenth and Stamina      Comments Letanya has been doing well in rehab.  She has noticed an improvement in her stamina and her ability to do more around the house.  She has been able to make several increases on both the XR and the treadmill.  We will continue to monitor her progression. Up until Thursday, Anzleigh has been doing well in rehab. We will now  reduce her  workloads when she returns and then begin to progress from there.      Expected Outcomes Short: Momoko will increase her home walking to two laps versus just one lap (reviewed on 03/15/16 see progress note).  Long;  Christyana will continue to build her strength and stamina. Short: Pegge will return to rehab.  Long: Work on increasing workloads back to where she was before her MI.         Discharge Exercise Prescription (Final Exercise Prescription Changes):     Exercise Prescription Changes - 04/06/16 1000      Response to Exercise   Blood Pressure (Admit) 118/64   Blood Pressure (Exercise) 138/64   Blood Pressure (Exit) 112/54   Heart Rate (Admit) 73 bpm   Heart Rate (Exercise) 80 bpm   Heart Rate (Exit) 64 bpm   Rating of Perceived Exertion (Exercise) 13   Symptoms L arm pain and SOB   Comments later that evening she went to ER with MI   Duration Progress to 45 minutes of aerobic exercise without signs/symptoms of physical distress   Intensity THRR unchanged     Progression   Progression Continue to progress workloads to maintain intensity without signs/symptoms of physical distress.   Average METs 3.1     Resistance Training   Training Prescription Yes   Weight 2 lbs   Reps 10-15     Interval Training   Interval Training Yes   Equipment REL-XR   Comments Added some intervals on XR      Treadmill   MPH 2   Grade 1   Minutes 15   METs 2.81     NuStep   Level 4   Minutes 15   METs 2.9     REL-XR   Level 3   Minutes 15   METs 3.6     Home Exercise Plan   Plans to continue exercise at Home (comment)  walking two laps   Frequency Add 3 additional days to program exercise sessions.   Initial Home Exercises Provided 03/15/16      Nutrition:  Target Goals: Understanding of nutrition guidelines, daily intake of sodium <1537m, cholesterol <209m calories 30% from fat and 7% or less from saturated fats, daily to have 5 or more servings of fruits and  vegetables.  Biometrics:     Pre Biometrics - 02/15/16 1531      Pre Biometrics   Height 5' 5.4" (1.661 m)   Weight 131 lb 3.2 oz (59.5 kg)   Waist Circumference 29 inches   Hip Circumference 36 inches   Waist to Hip Ratio 0.81 %   BMI (Calculated) 21.6   Single Leg Stand 30 seconds       Nutrition Therapy Plan and Nutrition Goals:     Nutrition Therapy & Goals - 03/01/16 1152      Nutrition Therapy   Diet Patient accompanied by her husband was instructed on Heart Healthy Dietary Guidelines based on 1500 calories.   Protein (specify units) 6   Fiber 20 grams   Whole Grain Foods 3 servings   Saturated Fats 11 max. grams   Fruits and Vegetables 5 servings/day   Sodium 1500 grams     Personal Nutrition Goals   Nutrition Goal Read labels for saturated fat, trans fat and sodium.   Personal Goal #2 Include at least 6 oz protein foods daily.   Personal Goal #3 Include whole grain servings daily (oatmeal, brown rice, popcorn, corn, 100& whole wheat bread)  Intervention Plan   Intervention Prescribe, educate and counsel regarding individualized specific dietary modifications aiming towards targeted core components such as weight, hypertension, lipid management, diabetes, heart failure and other comorbidities.;Nutrition handout(s) given to patient.   Expected Outcomes Short Term Goal: Understand basic principles of dietary content, such as calories, fat, sodium, cholesterol and nutrients.;Short Term Goal: A plan has been developed with personal nutrition goals set during dietitian appointment.;Long Term Goal: Adherence to prescribed nutrition plan.      Nutrition Discharge: Rate Your Plate Scores:     Nutrition Assessments - 02/16/16 1131      MEDFICTS Scores   Pre Score 40      Nutrition Goals Re-Evaluation:     Nutrition Goals Re-Evaluation    [place] Name 03/10/16 1021             Goals   Nutrition Goal Read labels for saturated fat, trans fat and sodium.        Comment Reading more labels         Personal Goal #1 Re-Evaluation   Goal Progress Seen Yes         Personal Goal #2 Re-Evaluation   Personal Goal #2 Include at least 6 oz protein foods daily.       Goal Progress Seen Yes       Re-Evaluation Eating more protein         Personal Goal #3 Re-Evaluation   Personal Goal #3 Include whole grain servings daily (oatmeal, brown rice, popcorn, corn, 100& whole wheat bread)       Goal Progress Seen Yes       Re-Evaluation Looking for whole grains         Intervention Plan   Intervention Continue to educate, counsel and set short/long term goals regarding individualized specific personal dietary modifications.       Comments Heide and her husband have cut back on eating out and are more mindful when they do go out to eat. Their friends have even begun to notice the changes in thier diets. They are also eating more friuts and vegetables as well. Goals: Continue to work on improving diet and sticking with it.          Nutrition Goals Discharge (Final Nutrition Goals Re-Evaluation):     Nutrition Goals Re-Evaluation - 03/10/16 1021      Goals   Nutrition Goal Read labels for saturated fat, trans fat and sodium.   Comment Reading more labels     Personal Goal #1 Re-Evaluation   Goal Progress Seen Yes     Personal Goal #2 Re-Evaluation   Personal Goal #2 Include at least 6 oz protein foods daily.   Goal Progress Seen Yes   Re-Evaluation Eating more protein     Personal Goal #3 Re-Evaluation   Personal Goal #3 Include whole grain servings daily (oatmeal, brown rice, popcorn, corn, 100& whole wheat bread)   Goal Progress Seen Yes   Re-Evaluation Looking for whole grains     Intervention Plan   Intervention Continue to educate, counsel and set short/long term goals regarding individualized specific personal dietary modifications.   Comments Bhakti and her husband have cut back on eating out and are more mindful when they do go out to eat.  Their friends have even begun to notice the changes in thier diets. They are also eating more friuts and vegetables as well. Goals: Continue to work on improving diet and sticking with it.      Psychosocial: Target  Goals: Acknowledge presence or absence of significant depression and/or stress, maximize coping skills, provide positive support system. Participant is able to verbalize types and ability to use techniques and skills needed for reducing stress and depression.   Initial Review & Psychosocial Screening:     Initial Psych Review & Screening - 02/15/16 [place]? Yes   Comments History Chronic Fatigue Syndrome since 1991. Flare ups occur     Screening Interventions   Interventions Encouraged to exercise      Quality of Life Scores:      Quality of Life - 02/15/16 1042      Quality of Life Scores   Health/Function Pre 25.67 %   Socioeconomic Pre 28.75 %   Psych/Spiritual Pre 30 %   Family Pre 28.5 %   GLOBAL Pre 27.58 %      PHQ-9: Recent Review Flowsheet Data    Depression screen [facility] 2/9 02/15/2016   Decreased Interest 0   Down, Depressed, Hopeless 0   PHQ - 2 Score 0   Altered sleeping 0   Tired, decreased energy 1    Change in appetite 0   Feeling bad or failure about yourself  0   Trouble concentrating 0   Moving slowly or fidgety/restless 0   Suicidal thoughts 0   PHQ-9 Score 1   Difficult doing work/chores Not difficult at all     Interpretation of Total Score  Total Score Depression Severity:  1-4 = Minimal depression, 5-9 = Mild depression, 10-14 = Moderate depression, 15-19 = Moderately severe depression, 20-27 = Severe depression   Psychosocial Evaluation and Intervention:     Psychosocial Evaluation - 02/18/16 0929      Psychosocial Evaluation & Interventions   Interventions Encouraged to exercise with the program and follow exercise prescription;Stress management education;Relaxation education    Comments Counselor met with Ms. Cashatt today for initial psychosocial evaluation.  She is a 76 year old who had Quad Bypass in mid-December.  She has a strong support system with a spouse of 13 years and a daughter who lives close by.  Ms. Stirn is also actively involved in her local church.  She has Chronic Fatigue Syndrome in addition to her cardiac health issues.  She reports sleeping on the couch still due to some problems breathing when lying flat on a bed since the surgery.  She states her appetite is good.  Ms. Pagliarulo denies a history of depression or anxiety and any current symptoms and states she is typically in a positive mood most of the time.  She states her stress is mostly due to her health issues but feels that she is recovering well - it will just take time.  Her goals for this program are to increase her stamina and strength so she can get back outside to "shoot some hoops" with her 5 year old granddaughter.  She has some gym equipment at home and plans to consistently exercise following completion of this program.  Staff will continue to follow with Ms. Thong throughout the course of this program.        Psychosocial Re-Evaluation:     Psychosocial Re-Evaluation    Row Name 03/10/16 1023             Psychosocial Re-Evaluation   Comments Nyisha feels that her stress levels are doing good.  She is sleeping well.  Overall, she is feeling good about everything and has no concerns.  Interventions Encouraged to attend Cardiac Rehabilitation for the exercise;Stress management education          Psychosocial Discharge (Final Psychosocial Re-Evaluation):     Psychosocial Re-Evaluation - 03/10/16 1023      Psychosocial Re-Evaluation   Comments Vertie feels that her stress levels are doing good.  She is sleeping well.  Overall, she is feeling good about everything and has no concerns.   Interventions Encouraged to attend Cardiac Rehabilitation for the exercise;Stress management  education      Vocational Rehabilitation: Provide vocational rehab assistance to qualifying candidates.   Vocational Rehab Evaluation & Intervention:     Vocational Rehab - 02/15/16 1041      Initial Vocational Rehab Evaluation & Intervention   Assessment shows need for Vocational Rehabilitation No      Education: Education Goals: Education classes will be provided on a weekly basis, covering required topics. Participant will state understanding/return demonstration of topics presented.  Learning Barriers/Preferences:     Learning Barriers/Preferences - 02/15/16 1041      Learning Barriers/Preferences   Learning Barriers Hearing  wears hearing aides   Learning Preferences Pictoral;Individual Instruction      Education Topics: General Nutrition Guidelines/Fats and Fiber: -Group instruction provided by verbal, written material, models and posters to present the general guidelines for heart healthy nutrition. Gives an explanation and review of dietary fats and fiber.   Cardiac Rehab from 03/24/2016 in [facility] Cardiac and Pulmonary Rehab  Date  03/22/16  Educator  CR  Instruction Review Code  2- meets goals/outcomes      Controlling Sodium/Reading Food Labels: -Group verbal and written material supporting the discussion of sodium use in heart healthy nutrition. Review and explanation with models, verbal and written materials for utilization of the food label.   Exercise Physiology & Risk Factors: - Group verbal and written instruction with models to review the exercise physiology of the cardiovascular system and associated critical values. Details cardiovascular disease risk factors and the goals associated with each risk factor.   Aerobic Exercise & Resistance Training: - Gives group verbal and written discussion on the health impact of inactivity. On the components of aerobic and resistive training programs and the benefits of this training and how to safely progress  through these programs.   Flexibility, Balance, General Exercise Guidelines: - Provides group verbal and written instruction on the benefits of flexibility and balance training programs. Provides general exercise guidelines with specific guidelines to those with heart or lung disease. Demonstration and skill practice provided.   Stress Management: - Provides group verbal and written instruction about the health risks of elevated stress, cause of high stress, and healthy ways to reduce stress.   Cardiac Rehab from 03/24/2016 in [facility] Cardiac and Pulmonary Rehab  Date  02/25/16  Educator  TS  Instruction Review Code  2- meets goals/outcomes      Depression: - Provides group verbal and written instruction on the correlation between heart/lung disease and depressed mood, treatment options, and the stigmas associated with seeking treatment.   Cardiac Rehab from 03/24/2016 in [facility] Cardiac and Pulmonary Rehab  Date  03/24/16  Educator  TS  Instruction Review Code  2- meets goals/outcomes      Anatomy & Physiology of the Heart: - Group verbal and written instruction and models provide basic cardiac anatomy and physiology, with the coronary electrical and arterial systems. Review of: AMI, Angina, Valve disease, Heart Failure, Cardiac Arrhythmia, Pacemakers, and the ICD.   Cardiac Rehab from 03/24/2016 in [facility]  Cardiac and Pulmonary Rehab  Date  02/23/16  Educator  CE  Instruction Review Code  2- meets goals/outcomes      Cardiac Procedures: - Group verbal and written instruction and models to describe the testing methods done to diagnose heart disease. Reviews the outcomes of the test results. Describes the treatment choices: Medical Management, Angioplasty, or Coronary Bypass Surgery.   Cardiac Rehab from 03/24/2016 in [facility] Cardiac and Pulmonary Rehab  Date  03/01/16  Educator  CE  Instruction Review Code  2- meets goals/outcomes      Cardiac Medications: - Group verbal and written  instruction to review commonly prescribed medications for heart disease. Reviews the medication, class of the drug, and side effects. Includes the steps to properly store meds and maintain the prescription regimen.   Cardiac Rehab from 03/24/2016 in [facility] Cardiac and Pulmonary Rehab  Date  03/08/16 [03/17/16 Cardiac Meds II]  Educator  SB  Instruction Review Code  2- meets goals/outcomes      Go Sex-Intimacy & Heart Disease, Get SMART - Goal Setting: - Group verbal and written instruction through game format to discuss heart disease and the return to sexual intimacy. Provides group verbal and written material to discuss and apply goal setting through the application of the S.M.A.R.T. Method.   Cardiac Rehab from 03/24/2016 in [facility] Cardiac and Pulmonary Rehab  Date  03/01/16  Educator  CE  Instruction Review Code  2- meets goals/outcomes      Other Matters of the Heart: - Provides group verbal, written materials and models to describe Heart Failure, Angina, Valve Disease, and Diabetes in the realm of heart disease. Includes description of the disease process and treatment options available to the cardiac patient.   Cardiac Rehab from 03/24/2016 in [facility] Cardiac and Pulmonary Rehab  Date  02/23/16  Educator  CE  Instruction Review Code  2- meets goals/outcomes      Exercise & Equipment Safety: - Individual verbal instruction and demonstration of equipment use and safety with use of the equipment.   Cardiac Rehab from 03/24/2016 in [facility] Cardiac and Pulmonary Rehab  Date  02/15/16  Educator  Loletha Grayer ENterkinRN  Instruction Review Code  1- partially meets, needs review/practice      Infection Prevention: - Provides verbal and written material to individual with discussion of infection control including proper hand washing and proper equipment cleaning during exercise session.   Cardiac Rehab from 03/24/2016 in [facility] Cardiac and Pulmonary Rehab  Date  02/15/16  Educator  C. EnterkinRN  Instruction  Review Code  2- meets goals/outcomes      Falls Prevention: - Provides verbal and written material to individual with discussion of falls prevention and safety.   Cardiac Rehab from 03/24/2016 in [facility] Cardiac and Pulmonary Rehab  Date  02/15/16  Educator  C. Cecil  Instruction Review Code  2- meets goals/outcomes      Diabetes: - Individual verbal and written instruction to review signs/symptoms of diabetes, desired ranges of glucose level fasting, after meals and with exercise. Advice that pre and post exercise glucose checks will be done for 3 sessions at entry of program.   Cardiac Rehab from 03/24/2016 in [facility] Cardiac and Pulmonary Rehab  Date  02/15/16       Knowledge Questionnaire Score:     Knowledge Questionnaire Score - 02/15/16 1239      Knowledge Questionnaire Score   Pre Score 27/28      Core Components/Risk Factors/Patient Goals at Admission:  Personal Goals and Risk Factors at Admission - 02/15/16 1041      Core Components/Risk Factors/Patient Goals on Admission    Weight Management Yes;Weight Maintenance   Intervention Weight Management: Develop a combined nutrition and exercise program designed to reach desired caloric intake, while maintaining appropriate intake of nutrient and fiber, sodium and fats, and appropriate energy expenditure required for the weight goal.;Weight Management: Provide education and appropriate resources to help participant work on and attain dietary goals.   Admit Weight 131 lb 3.2 oz (59.5 kg)   Expected Outcomes Weight Maintenance: Understanding of the daily nutrition guidelines, which includes 25-35% calories from fat, 7% or less cal from saturated fats, less than 236m cholesterol, less than 1.5gm of sodium, & 5 or more servings of fruits and vegetables daily   Sedentary Yes   Intervention Provide advice, education, support and counseling about physical activity/exercise needs.;Develop an individualized exercise prescription  for aerobic and resistive training based on initial evaluation findings, risk stratification, comorbidities and participant's personal goals.   Expected Outcomes Achievement of increased cardiorespiratory fitness and enhanced flexibility, muscular endurance and strength shown through measurements of functional capacity and personal statement of participant.   Increase Strength and Stamina Yes   Intervention Provide advice, education, support and counseling about physical activity/exercise needs.;Develop an individualized exercise prescription for aerobic and resistive training based on initial evaluation findings, risk stratification, comorbidities and participant's personal goals.   Expected Outcomes Achievement of increased cardiorespiratory fitness and enhanced flexibility, muscular endurance and strength shown through measurements of functional capacity and personal statement of participant.   Hypertension Yes   Intervention Provide education on lifestyle modifcations including regular physical activity/exercise, weight management, moderate sodium restriction and increased consumption of fresh fruit, vegetables, and low fat dairy, alcohol moderation, and smoking cessation.;Monitor prescription use compliance.   Expected Outcomes Short Term: Continued assessment and intervention until BP is < 140/969mHG in hypertensive participants. < 130/806mG in hypertensive participants with diabetes, heart failure or chronic kidney disease.;Long Term: Maintenance of blood pressure at goal levels.   Lipids Yes   Intervention Provide education and support for participant on nutrition & aerobic/resistive exercise along with prescribed medications to achieve LDL <23m56mDL >40mg55mExpected Outcomes Short Term: Participant states understanding of desired cholesterol values and is compliant with medications prescribed. Participant is following exercise prescription and nutrition guidelines.;Long Term: Cholesterol  controlled with medications as prescribed, with individualized exercise RX and with personalized nutrition plan. Value goals: LDL < 23mg,19m > 40 mg.      Core Components/Risk Factors/Patient Goals Review:      Goals and Risk Factor Review    Row Name 03/10/16 1018             Core Components/Risk Factors/Patient Goals Review   Personal Goals Review Weight Management/Obesity;Sedentary;Increase Strength and Stamina;Hypertension;Lipids       Review SheilaAsmaaf to a good start in rehab.  She does not have any major concerns about the program as her incisional pain is improving..  She has already begun to notice a difference and is pleased with what she has already been able to accomplish in rehab.  She still needs to rest when she gets home, but on Tuesday she was able to also get some house work done!!  She is walking some at home as well.  Blood pressures have been good; no problems with meds.       Expected Outcomes Short: Continue to come to exercise classes and add  in exercise at home.  Long: Continue to work on risk factor modification.          Core Components/Risk Factors/Patient Goals at Discharge (Final Review):      Goals and Risk Factor Review - 03/10/16 1018      Core Components/Risk Factors/Patient Goals Review   Personal Goals Review Weight Management/Obesity;Sedentary;Increase Strength and Stamina;Hypertension;Lipids   Review Angelyse is off to a good start in rehab.  She does not have any major concerns about the program as her incisional pain is improving..  She has already begun to notice a difference and is pleased with what she has already been able to accomplish in rehab.  She still needs to rest when she gets home, but on Tuesday she was able to also get some house work done!!  She is walking some at home as well.  Blood pressures have been good; no problems with meds.   Expected Outcomes Short: Continue to come to exercise classes and add in exercise at home.  Long:  Continue to work on risk factor modification.      ITP Comments:     ITP Comments    Row Name 02/15/16 1043 02/15/16 1237 02/15/16 1242 02/17/16 0627 03/16/16 0608   ITP Comments ITP created during Medical Review/Orientation appt. Documentation of DX CHL/EPIC Encounter dated 12/30/2015 Chronic Fatigue Syndrome but she crossed out fibromyalgi Appt made for Chandrea with the [employer]. 30 day review. Continue with ITP unless directed changes per Medical Director review. New to program 30 day review. Continue with ITP unless directed changes per Medical Director review   Row Name 04/01/16 1032 04/06/16 1049 04/12/16 1526 04/13/16 0610     ITP Comments Jaylon reported chest/arm pain on the NS. RN's P. Surles and K. Spencer noted ST depressions on tele which were also present at her prior sessions.  All vitals were within normal limits. Symptoms went away with rest. Symptoms did not reoccur on the TM or seated elliptical (XR).  Skilynn was instructed to call her Dr if symptoms return. Amaia was hospitalized for another MI and stent placement. She was discharged on 3/19 and has a follow up on 3/23 with Dr. Ubaldo Glassing.  I told her that we needed clearance to return and will reduced her workloads upon return. We have received Natsuko's clearance to return to rehab.  She will be returning on April 3.  Called to let her know. 30 day review. Continue with ITP unless directed changes per Medical Director review.         Comments:

## 2016-04-19 ENCOUNTER — Encounter: Payer: Medicare HMO | Attending: Cardiology | Admitting: *Deleted

## 2016-04-19 DIAGNOSIS — Z951 Presence of aortocoronary bypass graft: Secondary | ICD-10-CM | POA: Insufficient documentation

## 2016-04-19 NOTE — Progress Notes (Signed)
Daily Session Note  Patient Details  Name: Sheena Rice MRN: [id number] Date of Birth: 05/29/1940 Referring Provider:     Cardiac Rehab from 02/15/2016 in [facility] Cardiac and Pulmonary Rehab  Referring Provider  Bartholome Bill MD      Encounter Date: 04/19/2016  Check In:     Session Check In - 04/19/16 0908      Check-In   Location [facility]-Cardiac & Pulmonary Rehab   Staff Present Alberteen Sam, MA, ACSM RCEP, Exercise Physiologist;Susanne Bice, RN, BSN, CCRP;Laureen Owens Shark, BS, RRT, Respiratory Therapist   Supervising physician immediately available to respond to emergencies See telemetry face sheet for immediately available ER MD   Medication changes reported     No   Fall or balance concerns reported    No   Warm-up and Cool-down Performed on first and last piece of equipment   Resistance Training Performed Yes   VAD Patient? No     Pain Assessment   Currently in Pain? No/denies   Multiple Pain Sites No         History  Smoking Status  . Never Smoker  Smokeless Tobacco  . Never Used    Goals Met:  Independence with exercise equipment Exercise tolerated well No report of cardiac concerns or symptoms Strength training completed today  Goals Unmet:  Not Applicable  Comments: Pt able to follow exercise prescription today without complaint.  Will continue to monitor for progression. Sheena Rice returned today after being out for another stent and MI.  She was able to return with some reduced workloads.   Dr. Emily Filbert is Medical Director for [place] and [facility].

## 2016-04-21 DIAGNOSIS — Z951 Presence of aortocoronary bypass graft: Secondary | ICD-10-CM

## 2016-04-21 NOTE — Progress Notes (Signed)
Daily Session Note  Patient Details  Name: MAVERY MILLING MRN: [id number] Date of Birth: 15-May-1940 Referring Provider:     Cardiac Rehab from 02/15/2016 in [facility] Cardiac and Pulmonary Rehab  Referring Provider  Bartholome Bill MD      Encounter Date: 04/21/2016  Check In:     Session Check In - 04/21/16 0845      Check-In   Location [facility]-Cardiac & Pulmonary Rehab   Staff Present Alberteen Sam, MA, ACSM RCEP, Exercise Physiologist;Susanne Bice, RN, BSN, Lance Sell, BA, ACSM CEP, Exercise Physiologist   Supervising physician immediately available to respond to emergencies See telemetry face sheet for immediately available ER MD   Medication changes reported     No   Fall or balance concerns reported    No   Warm-up and Cool-down Performed on first and last piece of equipment   Resistance Training Performed Yes   VAD Patient? No     Pain Assessment   Currently in Pain? No/denies         History  Smoking Status  . Never Smoker  Smokeless Tobacco  . Never Used    Goals Met:  Proper associated with RPD/PD & O2 Sat Independence with exercise equipment Exercise tolerated well Strength training completed today  Goals Unmet:  Not Applicable  Comments: Pt able to follow exercise prescription today without complaint.  Will continue to monitor for progression.    Dr. Emily Filbert is Medical Director for [place] and [facility].

## 2016-04-25 ENCOUNTER — Other Ambulatory Visit: Payer: Self-pay | Admitting: Internal Medicine

## 2016-04-25 DIAGNOSIS — Z1231 Encounter for screening mammogram for malignant neoplasm of breast: Secondary | ICD-10-CM

## 2016-04-26 ENCOUNTER — Encounter: Payer: Medicare HMO | Admitting: Respiratory Therapy

## 2016-04-26 DIAGNOSIS — Z951 Presence of aortocoronary bypass graft: Secondary | ICD-10-CM | POA: Diagnosis not present

## 2016-04-26 NOTE — Progress Notes (Signed)
Daily Session Note  Patient Details  Name: Sheena Rice MRN: [id number] Date of Birth: 1940/08/31 Referring Provider:     Cardiac Rehab from 02/15/2016 in [facility] Cardiac and Pulmonary Rehab  Referring Provider  Bartholome Bill MD      Encounter Date: 04/26/2016  Check In:     Session Check In - 04/26/16 0828      Check-In   Location [facility]-Cardiac & Pulmonary Rehab   Staff Present Alberteen Sam, MA, ACSM RCEP, Exercise Physiologist;Susanne Bice, RN, BSN, CCRP;Laureen Owens Shark, BS, RRT, Respiratory Therapist   Supervising physician immediately available to respond to emergencies See telemetry face sheet for immediately available ER MD   Medication changes reported     No   Fall or balance concerns reported    No   Warm-up and Cool-down Performed on first and last piece of equipment   Resistance Training Performed Yes   VAD Patient? No     Pain Assessment   Currently in Pain? No/denies   Multiple Pain Sites No         History  Smoking Status   Never Smoker  Smokeless Tobacco   Never Used    Goals Met:  Proper associated with RPD/PD & O2 Sat Independence with exercise equipment Exercise tolerated well No report of cardiac concerns or symptoms Strength training completed today  Goals Unmet:  Not Applicable  Comments: Pt able to follow exercise prescription today without complaint.  Will continue to monitor for progression.   Dr. Emily Filbert is Medical Director for [place] and [facility].

## 2016-04-28 ENCOUNTER — Encounter: Payer: Medicare HMO | Admitting: *Deleted

## 2016-04-28 DIAGNOSIS — Z951 Presence of aortocoronary bypass graft: Secondary | ICD-10-CM

## 2016-04-28 NOTE — Progress Notes (Signed)
Daily Session Note  Patient Details  Name: Sheena Rice MRN: [id number] Date of Birth: 1940-08-26 Referring Provider:     Cardiac Rehab from 02/15/2016 in [facility] Cardiac and Pulmonary Rehab  Referring Provider  Bartholome Bill MD      Encounter Date: 04/28/2016  Check In:     Session Check In - 04/28/16 0951      Check-In   Location [facility]-Cardiac & Pulmonary Rehab   Staff Present Alberteen Sam, MA, ACSM RCEP, Exercise Physiologist;Susanne Bice, RN, BSN, Lance Sell, BA, ACSM CEP, Exercise Physiologist   Supervising physician immediately available to respond to emergencies See telemetry face sheet for immediately available ER MD   Medication changes reported     No   Fall or balance concerns reported    No   Warm-up and Cool-down Performed on first and last piece of equipment   Resistance Training Performed Yes   VAD Patient? No     Pain Assessment   Currently in Pain? No/denies   Multiple Pain Sites No         History  Smoking Status  . Never Smoker  Smokeless Tobacco  . Never Used    Goals Met:  Independence with exercise equipment Exercise tolerated well Personal goals reviewed No report of cardiac concerns or symptoms Strength training completed today  Goals Unmet:  Not Applicable  Comments: Pt able to follow exercise prescription today without complaint.  Will continue to monitor for progression. See ITP for goal reveiw   Dr. Emily Filbert is Medical Director for [place] and [facility].

## 2016-05-03 ENCOUNTER — Encounter: Payer: Medicare HMO | Admitting: Respiratory Therapy

## 2016-05-03 DIAGNOSIS — Z951 Presence of aortocoronary bypass graft: Secondary | ICD-10-CM | POA: Diagnosis not present

## 2016-05-03 NOTE — Progress Notes (Signed)
Daily Session Note  Patient Details  Name: Sheena Rice MRN: [id number] Date of Birth: 1940/07/03 Referring Provider:     Cardiac Rehab from 02/15/2016 in [facility] Cardiac and Pulmonary Rehab  Referring Provider  Bartholome Bill MD      Encounter Date: 05/03/2016  Check In:     Session Check In - 05/03/16 0840      Check-In   Location [facility]-Cardiac & Pulmonary Rehab   Staff Present Alberteen Sam, MA, ACSM RCEP, Exercise Physiologist;Susanne Bice, RN, BSN, CCRP;Laureen Owens Shark, BS, RRT, Respiratory Therapist   Supervising physician immediately available to respond to emergencies See telemetry face sheet for immediately available ER MD   Medication changes reported     No   Fall or balance concerns reported    No   Warm-up and Cool-down Performed on first and last piece of equipment   Resistance Training Performed Yes   VAD Patient? No     Pain Assessment   Currently in Pain? No/denies   Multiple Pain Sites No         History  Smoking Status   Never Smoker  Smokeless Tobacco   Never Used    Goals Met:  Proper associated with RPD/PD & O2 Sat Independence with exercise equipment Exercise tolerated well No report of cardiac concerns or symptoms Strength training completed today  Goals Unmet:  Not Applicable  Comments: Pt able to follow exercise prescription today without complaint.  Will continue to monitor for progression.      [place] Name 02/15/16 1526 05/03/16 1028       6 Minute Walk   Phase Initial Discharge    Distance 1260 feet 1480 feet    Distance % Change  -- 17.5 %  220 ft    Walk Time 6 minutes 6 minutes    # of Rest Breaks 0 0    MPH 2.39 2.8    METS 2.77 3.24    RPE 13 15    Perceived Dyspnea   -- 2    VO2 Peak 1260 1480    Symptoms No Yes (comment)    Comments  -- SOB    Resting HR 64 bpm 59 bpm    Resting BP 138/62 138/64    Max Ex. HR 88 bpm 92 bpm    Max Ex. BP 128/60 146/64    2 Minute Post BP 122/60  --         Dr. Emily Filbert is Medical Director for [place] and [facility].

## 2016-05-10 ENCOUNTER — Encounter: Payer: Medicare HMO | Admitting: Respiratory Therapy

## 2016-05-10 DIAGNOSIS — R079 Chest pain, unspecified: Secondary | ICD-10-CM | POA: Diagnosis not present

## 2016-05-10 DIAGNOSIS — I2510 Atherosclerotic heart disease of native coronary artery without angina pectoris: Secondary | ICD-10-CM | POA: Diagnosis not present

## 2016-05-10 DIAGNOSIS — I249 Acute ischemic heart disease, unspecified: Secondary | ICD-10-CM | POA: Diagnosis not present

## 2016-05-10 DIAGNOSIS — Z951 Presence of aortocoronary bypass graft: Secondary | ICD-10-CM | POA: Diagnosis not present

## 2016-05-10 DIAGNOSIS — R5382 Chronic fatigue, unspecified: Secondary | ICD-10-CM | POA: Diagnosis not present

## 2016-05-10 DIAGNOSIS — I10 Essential (primary) hypertension: Secondary | ICD-10-CM | POA: Diagnosis not present

## 2016-05-10 DIAGNOSIS — R0602 Shortness of breath: Secondary | ICD-10-CM | POA: Diagnosis not present

## 2016-05-10 DIAGNOSIS — E7800 Pure hypercholesterolemia, unspecified: Secondary | ICD-10-CM | POA: Diagnosis not present

## 2016-05-10 NOTE — Progress Notes (Signed)
Daily Session Note  Patient Details  Name: Sheena Rice MRN: [id number] Date of Birth: Jan 18, 1940 Referring Provider:     Cardiac Rehab from 02/15/2016 in [facility] Cardiac and Pulmonary Rehab  Referring Provider  Bartholome Bill MD      Encounter Date: 05/10/2016  Check In:     Session Check In - 05/10/16 0848      Check-In   Location [facility]-Cardiac & Pulmonary Rehab   Staff Present Alberteen Sam, MA, ACSM RCEP, Exercise Physiologist;Susanne Bice, RN, BSN, CCRP;Laureen Owens Shark, BS, RRT, Respiratory Therapist   Supervising physician immediately available to respond to emergencies See telemetry face sheet for immediately available ER MD   Medication changes reported     Yes   Comments Discontinue Lisinopril   Fall or balance concerns reported    No   Warm-up and Cool-down Performed on first and last piece of equipment   Resistance Training Performed Yes   VAD Patient? No     Pain Assessment   Currently in Pain? No/denies   Multiple Pain Sites No         History  Smoking Status  . Never Smoker  Smokeless Tobacco  . Never Used    Goals Met:  Independence with exercise equipment Exercise tolerated well No report of cardiac concerns or symptoms Strength training completed today  Goals Unmet:  Not Applicable  Comments: Pt able to follow exercise prescription today without complaint.  Will continue to monitor for progression.    Dr. Emily Filbert is Medical Director for [place] and [facility].

## 2016-05-11 ENCOUNTER — Encounter: Payer: Self-pay | Admitting: *Deleted

## 2016-05-11 DIAGNOSIS — Z951 Presence of aortocoronary bypass graft: Secondary | ICD-10-CM

## 2016-05-11 NOTE — Progress Notes (Signed)
Cardiac Individual Treatment Plan  Patient Details  Name: KALANA YUST MRN: [id number] Date of Birth: 02/09/1940 Referring Provider:     Cardiac Rehab from 02/15/2016 in [facility] Cardiac and Pulmonary Rehab  Referring Provider  Bartholome Bill MD      Initial Encounter Date:    Cardiac Rehab from 02/15/2016 in [facility] Cardiac and Pulmonary Rehab  Date  02/15/16  Referring Provider  Bartholome Bill MD      Visit Diagnosis: S/P CABG x 4  Patient's Home Medications on Admission:  Current Outpatient Prescriptions:  .  aspirin EC 81 MG EC tablet, Take 1 tablet (81 mg total) by mouth daily., Disp: 30 tablet, Rfl: 0 .  Cholecalciferol (VITAMIN D3) 5000 units CAPS, Take 5,000 Units by mouth daily. , Disp: , Rfl:  .  co-enzyme Q-10 30 MG capsule, Take 300 mg by mouth daily., Disp: , Rfl:  .  colestipol (COLESTID) 1 g tablet, Take 2 g by mouth 2 (two) times daily. , Disp: , Rfl:  .  cyclobenzaprine (FLEXERIL) 10 MG tablet, Take 10 mg by mouth at bedtime. , Disp: , Rfl:  .  esomeprazole (NEXIUM) 20 MG capsule, Take 20 mg by mouth every morning. , Disp: , Rfl:  .  Flaxseed, Linseed, (FLAX SEED OIL PO), Take 1,400 mg by mouth daily., Disp: , Rfl:  .  gemfibrozil (LOPID) 600 MG tablet, Take 600 mg by mouth 2 (two) times daily. , Disp: , Rfl:  .  isosorbide mononitrate (IMDUR) 30 MG 24 hr tablet, Take 1 tablet (30 mg total) by mouth daily., Disp: 30 tablet, Rfl: 5 .  lisinopril (PRINIVIL,ZESTRIL) 2.5 MG tablet, Take 1 tablet (2.5 mg total) by mouth daily., Disp: 30 tablet, Rfl: 5 .  Magnesium Oxide 400 MG CAPS, Take 400 mg by mouth 2 (two) times daily. , Disp: , Rfl:  .  metoprolol tartrate (LOPRESSOR) 25 MG tablet, Take 1 tablet (25 mg total) by mouth 2 (two) times daily., Disp: 30 tablet, Rfl: 3 .  Probiotic Product (PROBIOTIC DAILY) CAPS, Take 1 capsule by mouth daily., Disp: , Rfl:  .  rosuvastatin (CRESTOR) 10 MG tablet, Take 1 tablet (10 mg total) by mouth daily at 6 PM., Disp: 30 tablet, Rfl: 5 .   ticagrelor ([name]) 90 MG TABS tablet, Take 1 tablet (90 mg total) by mouth 2 (two) times daily., Disp: 60 tablet, Rfl: 5 No current facility-administered medications for this visit.   Facility-Administered Medications Ordered in Other Visits:  .  0.9 %  sodium chloride infusion, 250 mL, Intravenous, PRN, Teodoro Spray, MD .  [EXPIRED] 0.9% sodium chloride infusion, 3 mL/kg/hr, Intravenous, Continuous **FOLLOWED BY** 0.9% sodium chloride infusion, 1 mL/kg/hr, Intravenous, Continuous, Teodoro Spray, MD .  sodium chloride flush (NS) 0.9 % injection 3 mL, 3 mL, Intravenous, Q12H, Teodoro Spray, MD .  sodium chloride flush (NS) 0.9 % injection 3 mL, 3 mL, Intravenous, PRN, Teodoro Spray, MD  Past Medical History: Past Medical History:  Diagnosis Date  . Arthritis    OA  . Chronic fatigue   . Coronary artery disease   . GERD (gastroesophageal reflux disease)   . Hypertension   . Unstable angina (HCC)     Tobacco Use: History  Smoking Status  . Never Smoker  Smokeless Tobacco  . Never Used    Labs: Recent Review Flowsheet Data    Labs for ITP Cardiac and Pulmonary Rehab Latest Ref Rng & Units 12/31/2015 12/31/2015 12/31/2015 01/01/2016 04/02/2016   Cholestrol  0 - 200 mg/dL - - - - 199   LDLCALC 0 - 99 mg/dL - - - - 139(H)   HDL >40 mg/dL - - - - 35(L)   Trlycerides <150 mg/dL - - - - 127   Hemoglobin A1c 4.8 - 5.6 % - - - - -   PHART 7.350 - 7.450 7.325(L) - 7.306(L) - -   PCO2ART 32.0 - 48.0 mmHg 42.5 - 41.8 - -   HCO3 20.0 - 28.0 mmol/L 22.3 - 21.0 - -   TCO2 0 - 100 mmol/L _0 -   ACIDBASEDEF 0.0 - 2.0 mmol/L 4.0(H) - 5.0(H) - -   O2SAT % 97.0 - 97.0 - -       Exercise Target Goals:    Exercise Program Goal: Individual exercise prescription set with THRR, safety & activity barriers. Participant demonstrates ability to understand and report RPE using BORG scale, to self-measure pulse accurately, and to acknowledge the importance of the exercise  prescription.  Exercise Prescription Goal: Starting with aerobic activity 30 plus minutes a day, 3 days per week for initial exercise prescription. Provide home exercise prescription and guidelines that participant acknowledges understanding prior to discharge.  Activity Barriers & Risk Stratification:     Activity Barriers & Cardiac Risk Stratification - 02/15/16 1236      Activity Barriers & Cardiac Risk Stratification   Activity Barriers Deconditioning;Muscular Weakness;Incisional Pain;Other (comment)   Comments Chronic Fatigue Syndrome but she crossed out fibromyalgia   Cardiac Risk Stratification High      6 Minute Walk:     6 Minute Walk    Row Name 02/15/16 1526 05/03/16 1028       6 Minute Walk   Phase Initial Discharge    Distance 1260 feet 1480 feet    Distance % Change  - 17.5 %  220 ft    Walk Time 6 minutes 6 minutes    # of Rest Breaks 0 0    MPH 2.39 2.8    METS 2.77 3.24    RPE 13 15    Perceived Dyspnea   - 2    VO2 Peak 1260 1480    Symptoms No Yes (comment)    Comments  - SOB    Resting HR 64 bpm 59 bpm    Resting BP 138/62 138/64    Max Ex. HR 88 bpm 92 bpm    Max Ex. BP 128/60 146/64    2 Minute Post BP 122/60  -       Oxygen Initial Assessment:   Oxygen Re-Evaluation:   Oxygen Discharge (Final Oxygen Re-Evaluation):   Initial Exercise Prescription:     Initial Exercise Prescription - 02/15/16 1500      Date of Initial Exercise RX and Referring Provider   Date 02/15/16   Referring Provider Bartholome Bill MD     Treadmill   MPH 2.3   Grade 0   Minutes 15   METs 2.76     Bike   Level 0.5   Minutes 15   METs 2     NuStep   Level 2   Minutes 15   METs 2     Biostep-RELP   Level 2   Minutes 15   METs 2     Prescription Details   Frequency (times per week) 2   Duration Progress to 45 minutes of aerobic exercise without signs/symptoms of physical distress     Intensity   THRR 40-80% of Max  Heartrate 97-130    Ratings of Perceived Exertion 11-15   Perceived Dyspnea 0-4     Progression   Progression Continue to progress workloads to maintain intensity without signs/symptoms of physical distress.     Resistance Training   Training Prescription Yes   Weight 2 lbs   Reps 10-15      Perform Capillary Blood Glucose checks as needed.  Exercise Prescription Changes:     Exercise Prescription Changes    Row Name 02/15/16 1500 02/24/16 1500 03/09/16 1500 03/23/16 1500 04/06/16 1000     Response to Exercise   Blood Pressure (Admit) 138/62 128/74 128/60 134/62 118/64   Blood Pressure (Exercise) 128/60 160/80 134/64 140/66 138/64   Blood Pressure (Exit) 122/60 126/64 144/62 134/70 112/54   Heart Rate (Admit) 64 bpm 74 bpm 76 bpm 76 bpm 73 bpm   Heart Rate (Exercise) 88 bpm 98 bpm 119 bpm 116 bpm 80 bpm   Heart Rate (Exit) 70 bpm 70 bpm 62 bpm 75 bpm 64 bpm   Oxygen Saturation (Admit) 93 %  -  -  -  -   Oxygen Saturation (Exercise) 100 %  -  -  -  -   Rating of Perceived Exertion (Exercise) _0 Symptoms none none none none L arm pain and SOB   Comments  -  -  -  - later that evening she went to ER with MI   Duration  - Progress to 30 minutes of continuous aerobic without signs/symptoms of physical distress Progress to 45 minutes of aerobic exercise without signs/symptoms of physical distress Continue with 45 min of aerobic exercise without signs/symptoms of physical distress. Progress to 45 minutes of aerobic exercise without signs/symptoms of physical distress   Intensity  - THRR unchanged THRR unchanged THRR unchanged THRR unchanged     Progression   Progression  - Continue to progress workloads to maintain intensity without signs/symptoms of physical distress. Continue to progress workloads to maintain intensity without signs/symptoms of physical distress. Continue to progress workloads to maintain intensity without signs/symptoms of physical distress. Continue to progress workloads  to maintain intensity without signs/symptoms of physical distress.   Average METs  - 2.49 3.65 5.82 3.1     Resistance Training   Training Prescription  - Yes Yes Yes Yes   Weight  - 2 lbs 2 lbs 2 lbs 2 lbs   Reps  - 10-15 10-15 10-15 10-15     Interval Training   Interval Training  - No No Yes Yes   Equipment  -  -  - REL-XR REL-XR   Comments  -  -  - Added some intervals on XR  Added some intervals on XR      Treadmill   MPH  - 2.3 2.5 2.6 2   Grade  - 0 _1 Minutes  - _2 METs  - 2.76 3.24 3.35 2.81     Bike   Level  - 0.5  -  -  -   Minutes  - 15  -  -  -   METs  - 2.5  -  -  -     NuStep   Level  - _3 Minutes  - _4 METs  - 2.4 3.1 3.1 2.9     REL-XR   Level  -  - 1 3 3  Minutes  -  - '15 15 15   '$ METs  -  - 4.6 10.1 3.6     Home Exercise Plan   Plans to continue exercise at  -  -  - Home (comment)  walking two laps Home (comment)  walking two laps   Frequency  -  -  - Add 3 additional days to program exercise sessions. Add 3 additional days to program exercise sessions.   Initial Home Exercises Provided  -  -  - 03/15/16 03/15/16     Exercise Review   Progression -  walk test results Yes Yes  -  -   Row Name 04/21/16 1500 05/06/16 1600           Response to Exercise   Blood Pressure (Admit) 116/64 138/64      Blood Pressure (Exercise) 128/62 138/70      Blood Pressure (Exit) 104/60 118/58      Heart Rate (Admit) 84 bpm 60 bpm      Heart Rate (Exercise) 109 bpm 104 bpm      Heart Rate (Exit) 63 bpm 66 bpm      Rating of Perceived Exertion (Exercise) 13 15      Symptoms none none      Duration Progress to 45 minutes of aerobic exercise without signs/symptoms of physical distress Continue with 45 min of aerobic exercise without signs/symptoms of physical distress.      Intensity THRR unchanged THRR unchanged        Progression   Progression Continue to progress workloads to maintain intensity without signs/symptoms of  physical distress. Continue to progress workloads to maintain intensity without signs/symptoms of physical distress.      Average METs 2.91 3.3        Resistance Training   Training Prescription Yes Yes      Weight 2 lbs 3 lbs      Reps 10-15 10-15        Interval Training   Interval Training No No        Treadmill   MPH 1.5 2      Grade 0 1      Minutes 15 15      METs 2.15 2.81        NuStep   Level 4 4      Minutes 15 15      METs 3.1 3.6        REL-XR   Level 3 3      Minutes 15 15      METs 3.5 3.5        Home Exercise Plan   Plans to continue exercise at Home (comment)  walking two laps Home (comment)  walking two laps      Frequency Add 3 additional days to program exercise sessions. Add 3 additional days to program exercise sessions.      Initial Home Exercises Provided 03/15/16 03/15/16         Exercise Comments:     Exercise Comments    Row Name 02/18/16 0160 02/24/16 1538 03/09/16 1538 03/17/16 1040 03/24/16 0955   Exercise Comments First full day of exercise!  Patient was oriented to gym and equipment including functions, settings, policies, and procedures.  Patient's individual exercise prescription and treatment plan were reviewed.  All starting workloads were established based on the results of the 6 minute walk test done at initial orientation visit.  The plan for exercise progression was also introduced and progression will  be customized based on patient's performance and goals. Adela Lank is off to a good start in rehab.  She has mentioned that she was tired after her first day and needed a nap.  I encouraged her that this is normal and hopefully her stamina will improve.  We will continue to monitor her progression. Adela Lank has continued to do well in rehab.  She is still getting her nap, but feels that she has earned her nap after rehab.  She has traded the bike out for the recumbent elliptical and finds it more comfortable but challenging.  We will continue to  monitor her progress. MET levels reviewed today. Reviewed METs average and discussed progression with pt today.   Row Name 03/31/16 1116 04/01/16 1027 04/01/16 1031 04/19/16 0909     Exercise Comments Minette reported chest/arm pain on the NS.  Symptoms went away with rest.  All vitals were within normal limits.  Symptoms did not reoccur on the TM or seated elliptical (XR).  Jenean was instructed to call her Dr if symptoms return. Meighan reported chest/arm pain on the NS. RN's P. Surles and K. Spencer noted ST depressions on tele which were also present at her prior sessions.  All vitals were within normal limits. Symptoms went away with rest. Symptoms did not reoccur on the TM or seated elliptical (XR).  Dari was instructed to call her Dr if symptoms return. Zierra reported chest/arm pain on the NS. RN's P. Surles and K. Spencer noted ST depressions on tele which were also present at her prior sessions.  All vitals were within normal limits. Symptoms went away with rest. Symptoms did not reoccur on the TM or seated elliptical (XR).  Chamaine was instructed to call her Dr if symptoms return. Mckynzi returned today after being out for another stent and MI.  She was able to return with some reduced workloads.       Exercise Goals and Review:   Exercise Goals Re-Evaluation :     Exercise Goals Re-Evaluation    Row Name 03/23/16 1544 04/06/16 1051 04/21/16 1526 04/28/16 0949 05/06/16 1628     Exercise Goal Re-Evaluation   Exercise Goals Review Increase Physical Activity;Increase Strenth and Stamina Increase Physical Activity;Increase Strenth and Stamina Increase Physical Activity;Increase Strenth and Stamina Increase Physical Activity;Increase Strenth and Stamina Increase Physical Activity;Increase Strenth and Stamina   Comments Ercia has been doing well in rehab.  She has noticed an improvement in her stamina and her ability to do more around the house.  She has been able to make several increases on  both the XR and the treadmill.  We will continue to monitor her progression. Up until Thursday, Blakleigh has been doing well in rehab. We will now reduce her workloads when she returns and then begin to progress from there. Kathey returned this week after her heart event. She is working her way back to where she was before.  We will continue to monitor her progress. Magin is feeling better and starting to feel stronger again.  We looked at her METs to see that she is about where she left off before we started doing intervals.  She is able to do just about every thing she wants, biggest culprit still are the stairs and inclines in her neighborhood.  She is walking at home some. Shwanda is doing well in rehab.  She improved her post 6MWT by 17%!  She is still walking at home as well.  We will continue to monitor her progression.  Expected Outcomes Short: Jamilyn will increase her home walking to two laps versus just one lap (reviewed on 03/15/16 see progress note).  Long;  Aslee will continue to build her strength and stamina. Short: Yehudis will return to rehab.  Long: Work on increasing workloads back to where she was before her MI. Short;  Charlee will work her way back up.  Long: She will continue to come to classes to work on strength and stamina. Short: Christalyn will continue to work on progressing her workloads.  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 Short: Khamryn will be graduating.  Long: She will continue to exercise to improve her strength and stamina.      Discharge Exercise Prescription (Final Exercise Prescription Changes):     Exercise Prescription Changes - 05/06/16 1600      Response to Exercise   Blood Pressure (Admit) 138/64   Blood Pressure (Exercise) 138/70   Blood Pressure (Exit) 118/58   Heart Rate (Admit) 60 bpm   Heart Rate (Exercise) 104 bpm   Heart Rate (Exit) 66 bpm   Rating of Perceived Exertion (Exercise) 15   Symptoms none   Duration Continue with  45 min of aerobic exercise without signs/symptoms of physical distress.   Intensity THRR unchanged     Progression   Progression Continue to progress workloads to maintain intensity without signs/symptoms of physical distress.   Average METs 3.3     Resistance Training   Training Prescription Yes   Weight 3 lbs   Reps 10-15     Interval Training   Interval Training No     Treadmill   MPH 2   Grade 1   Minutes 15   METs 2.81     NuStep   Level 4   Minutes 15   METs 3.6     REL-XR   Level 3   Minutes 15   METs 3.5     Home Exercise Plan   Plans to continue exercise at Home (comment)  walking two laps   Frequency Add 3 additional days to program exercise sessions.   Initial Home Exercises Provided 03/15/16      Nutrition:  Target Goals: Understanding of nutrition guidelines, daily intake of sodium '1500mg'$ , cholesterol '200mg'$ , calories 30% from fat and 7% or less from saturated fats, daily to have 5 or more servings of fruits and vegetables.  Biometrics:     Pre Biometrics - 02/15/16 1531      Pre Biometrics   Height 5' 5.4" (1.661 m)   Weight 131 lb 3.2 oz (59.5 kg)   Waist Circumference 29 inches   Hip Circumference 36 inches   Waist to Hip Ratio 0.81 %   BMI (Calculated) 21.6   Single Leg Stand 30 seconds       Nutrition Therapy Plan and Nutrition Goals:     Nutrition Therapy & Goals - 03/01/16 1152      Nutrition Therapy   Diet Patient accompanied by her husband was instructed on Heart Healthy Dietary Guidelines based on 1500 calories.   Protein (specify units) 6   Fiber 20 grams   Whole Grain Foods 3 servings   Saturated Fats 11 max. grams   Fruits and Vegetables 5 servings/day   Sodium 1500 grams     Personal Nutrition Goals   Nutrition Goal Read labels for saturated fat, trans fat and sodium.   Personal Goal #2 Include at least 6 oz protein foods daily.   Personal Goal #  3 Include whole grain servings daily (oatmeal, brown rice, popcorn,  corn, 100& whole wheat bread)     Intervention Plan   Intervention Prescribe, educate and counsel regarding individualized specific dietary modifications aiming towards targeted core components such as weight, hypertension, lipid management, diabetes, heart failure and other comorbidities.;Nutrition handout(s) given to patient.   Expected Outcomes Short Term Goal: Understand basic principles of dietary content, such as calories, fat, sodium, cholesterol and nutrients.;Short Term Goal: A plan has been developed with personal nutrition goals set during dietitian appointment.;Long Term Goal: Adherence to prescribed nutrition plan.      Nutrition Discharge: Rate Your Plate Scores:     Nutrition Assessments - 05/10/16 1141      MEDFICTS Scores   Pre Score 40   Post Score 44   Score Difference 4      Nutrition Goals Re-Evaluation:     Nutrition Goals Re-Evaluation    Row Name 03/10/16 1021 04/28/16 0915           Goals   Current Weight  - 133 lb (60.3 kg)      Nutrition Goal Read labels for saturated fat, trans fat and sodium. Reading labels, eating more protien, whole grains      Comment Reading more labels Adela Lank is doig well with her diet.  They are eating more at home and reading the food labels. Her husband does most of the cooking.  She is still struggling to get enough protein.  We discussed some other alternatives to sneak her protien in like putting nuts on her oatmeal and adding a little cheese to meals.  She is trying to do better and making healthier choices.      Expected Outcome  - Short: Continue to work on protein intake.  Long: Continue with heart healthy diet.        Personal Goal #1 Re-Evaluation   Goal Progress Seen Yes  -        Personal Goal #2 Re-Evaluation   Personal Goal #2 Include at least 6 oz protein foods daily.  -      Goal Progress Seen Yes  -      Re-Evaluation Eating more protein  -        Personal Goal #3 Re-Evaluation   Personal Goal #3 Include  whole grain servings daily (oatmeal, brown rice, popcorn, corn, 100& whole wheat bread)  -      Goal Progress Seen Yes  -      Re-Evaluation Looking for whole grains  -        Intervention Plan   Intervention Continue to educate, counsel and set short/long term goals regarding individualized specific personal dietary modifications.  -      Comments Juna and her husband have cut back on eating out and are more mindful when they do go out to eat. Their friends have even begun to notice the changes in thier diets. They are also eating more friuts and vegetables as well. Goals: Continue to work on improving diet and sticking with it.  -         Nutrition Goals Discharge (Final Nutrition Goals Re-Evaluation):     Nutrition Goals Re-Evaluation - 04/28/16 0915      Goals   Current Weight 133 lb (60.3 kg)   Nutrition Goal Reading labels, eating more protien, whole grains   Comment Shelia is doig well with her diet.  They are eating more at home and reading the food labels. Her husband does most  of the cooking.  She is still struggling to get enough protein.  We discussed some other alternatives to sneak her protien in like putting nuts on her oatmeal and adding a little cheese to meals.  She is trying to do better and making healthier choices.   Expected Outcome Short: Continue to work on protein intake.  Long: Continue with heart healthy diet.      Psychosocial: Target Goals: Acknowledge presence or absence of significant depression and/or stress, maximize coping skills, provide positive support system. Participant is able to verbalize types and ability to use techniques and skills needed for reducing stress and depression.   Initial Review & Psychosocial Screening:     Initial Psych Review & Screening - 02/15/16 [place]? Yes   Comments History Chronic Fatigue Syndrome since 1991. Flare ups occur     Screening Interventions   Interventions  Encouraged to exercise      Quality of Life Scores:      Quality of Life - 05/10/16 1142      Quality of Life Scores   Health/Function Pre 25.67 %   Health/Function Post 23.3 %   Health/Function % Change -9.23 %   Socioeconomic Pre 28.75 %   Socioeconomic Post 28.5 %   Socioeconomic % Change  -0.87 %   Psych/Spiritual Pre 30 %   Psych/Spiritual Post 26.79 %   Psych/Spiritual % Change -10.7 %   Family Pre 28.5 %   Family Post 27 %   Family % Change -5.26 %   GLOBAL Pre 27.58 %   GLOBAL Post 25.45 %   GLOBAL % Change -7.72 %      PHQ-9: Recent Review Flowsheet Data    Depression screen [facility] 2/9 05/10/2016 02/15/2016   Decreased Interest 1 0   Down, Depressed, Hopeless 1 0   PHQ - 2 Score 2 0   Altered sleeping 0 0   Tired, decreased energy 3 1    Change in appetite 0 0   Feeling bad or failure about yourself  0 0   Trouble concentrating 0 0   Moving slowly or fidgety/restless 0 0   Suicidal thoughts 0 0   PHQ-9 Score 5 1   Difficult doing work/chores Somewhat difficult Not difficult at all     Interpretation of Total Score  Total Score Depression Severity:  1-4 = Minimal depression, 5-9 = Mild depression, 10-14 = Moderate depression, 15-19 = Moderately severe depression, 20-27 = Severe depression   Psychosocial Evaluation and Intervention:     Psychosocial Evaluation - 02/18/16 0929      Psychosocial Evaluation & Interventions   Interventions Encouraged to exercise with the program and follow exercise prescription;Stress management education;Relaxation education   Comments Counselor met with Ms. Wombles today for initial psychosocial evaluation.  She is a 76 year old who had Quad Bypass in mid-December.  She has a strong support system with a spouse of 81 years and a daughter who lives close by.  Ms. Yamamoto is also actively involved in her local church.  She has Chronic Fatigue Syndrome in addition to her cardiac health issues.  She reports sleeping on the couch still  due to some problems breathing when lying flat on a bed since the surgery.  She states her appetite is good.  Ms. Meloche denies a history of depression or anxiety and any current symptoms and states she is typically in a positive mood most of the  time.  She states her stress is mostly due to her health issues but feels that she is recovering well - it will just take time.  Her goals for this program are to increase her stamina and strength so she can get back outside to "shoot some hoops" with her 19 year old granddaughter.  She has some gym equipment at home and plans to consistently exercise following completion of this program.  Staff will continue to follow with Ms. Bouvier throughout the course of this program.        Psychosocial Re-Evaluation:     Psychosocial Re-Evaluation    Row Name 03/10/16 1023 04/28/16 0914           Psychosocial Re-Evaluation   Current issues with  - None Identified      Comments Aparna feels that her stress levels are doing good.  She is sleeping well.  Overall, she is feeling good about everything and has no concerns. Adela Lank is feeling good and positive overall.  She realizes that she will not be able to do as much this summer as she did last summer.  Her husband is very supportive and helpful.  She is also sleeping good routinely.      Expected Outcomes  - Continue to have postive outlook      Interventions Encouraged to attend Cardiac Rehabilitation for the exercise;Stress management education Encouraged to attend Cardiac Rehabilitation for the exercise         Psychosocial Discharge (Final Psychosocial Re-Evaluation):     Psychosocial Re-Evaluation - 04/28/16 0914      Psychosocial Re-Evaluation   Current issues with None Identified   Comments Adela Lank is feeling good and positive overall.  She realizes that she will not be able to do as much this summer as she did last summer.  Her husband is very supportive and helpful.  She is also sleeping good  routinely.   Expected Outcomes Continue to have postive outlook   Interventions Encouraged to attend Cardiac Rehabilitation for the exercise      Vocational Rehabilitation: Provide vocational rehab assistance to qualifying candidates.   Vocational Rehab Evaluation & Intervention:     Vocational Rehab - 02/15/16 1041      Initial Vocational Rehab Evaluation & Intervention   Assessment shows need for Vocational Rehabilitation No      Education: Education Goals: Education classes will be provided on a weekly basis, covering required topics. Participant will state understanding/return demonstration of topics presented.  Learning Barriers/Preferences:     Learning Barriers/Preferences - 02/15/16 1041      Learning Barriers/Preferences   Learning Barriers Hearing  wears hearing aides   Learning Preferences Pictoral;Individual Instruction      Education Topics: General Nutrition Guidelines/Fats and Fiber: -Group instruction provided by verbal, written material, models and posters to present the general guidelines for heart healthy nutrition. Gives an explanation and review of dietary fats and fiber.   Cardiac Rehab from 05/10/2016 in [facility] Cardiac and Pulmonary Rehab  Date  03/22/16  Educator  CR  Instruction Review Code  2- meets goals/outcomes      Controlling Sodium/Reading Food Labels: -Group verbal and written material supporting the discussion of sodium use in heart healthy nutrition. Review and explanation with models, verbal and written materials for utilization of the food label.   Exercise Physiology & Risk Factors: - Group verbal and written instruction with models to review the exercise physiology of the cardiovascular system and associated critical values. Details cardiovascular disease risk factors  and the goals associated with each risk factor.   Aerobic Exercise & Resistance Training: - Gives group verbal and written discussion on the health impact of  inactivity. On the components of aerobic and resistive training programs and the benefits of this training and how to safely progress through these programs.   Flexibility, Balance, General Exercise Guidelines: - Provides group verbal and written instruction on the benefits of flexibility and balance training programs. Provides general exercise guidelines with specific guidelines to those with heart or lung disease. Demonstration and skill practice provided.   Stress Management: - Provides group verbal and written instruction about the health risks of elevated stress, cause of high stress, and healthy ways to reduce stress.   Cardiac Rehab from 05/10/2016 in [facility] Cardiac and Pulmonary Rehab  Date  04/19/16  Educator  [facility]  Instruction Review Code  2- meets goals/outcomes      Depression: - Provides group verbal and written instruction on the correlation between heart/lung disease and depressed mood, treatment options, and the stigmas associated with seeking treatment.   Cardiac Rehab from 05/10/2016 in [facility] Cardiac and Pulmonary Rehab  Date  03/24/16  Educator  TS  Instruction Review Code  2- meets goals/outcomes      Anatomy & Physiology of the Heart: - Group verbal and written instruction and models provide basic cardiac anatomy and physiology, with the coronary electrical and arterial systems. Review of: AMI, Angina, Valve disease, Heart Failure, Cardiac Arrhythmia, Pacemakers, and the ICD.   Cardiac Rehab from 05/10/2016 in [facility] Cardiac and Pulmonary Rehab  Date  04/21/16  Educator  SB  Instruction Review Code  2- meets goals/outcomes      Cardiac Procedures: - Group verbal and written instruction and models to describe the testing methods done to diagnose heart disease. Reviews the outcomes of the test results. Describes the treatment choices: Medical Management, Angioplasty, or Coronary Bypass Surgery.   Cardiac Rehab from 05/10/2016 in [facility] Cardiac and Pulmonary Rehab  Date   04/26/16  Educator  SB  Instruction Review Code  2- meets goals/outcomes      Cardiac Medications: - Group verbal and written instruction to review commonly prescribed medications for heart disease. Reviews the medication, class of the drug, and side effects. Includes the steps to properly store meds and maintain the prescription regimen.   Cardiac Rehab from 05/10/2016 in [facility] Cardiac and Pulmonary Rehab  Date  05/03/16 [03/17/16 Cardiac Meds II]  Educator  SB  Instruction Review Code  2- meets goals/outcomes [Part 1]      Go Sex-Intimacy & Heart Disease, Get SMART - Goal Setting: - Group verbal and written instruction through game format to discuss heart disease and the return to sexual intimacy. Provides group verbal and written material to discuss and apply goal setting through the application of the S.M.A.R.T. Method.   Cardiac Rehab from 05/10/2016 in [facility] Cardiac and Pulmonary Rehab  Date  04/26/16  Educator  SB  Instruction Review Code  2- meets goals/outcomes      Other Matters of the Heart: - Provides group verbal, written materials and models to describe Heart Failure, Angina, Valve Disease, and Diabetes in the realm of heart disease. Includes description of the disease process and treatment options available to the cardiac patient.   Cardiac Rehab from 05/10/2016 in [facility] Cardiac and Pulmonary Rehab  Date  02/23/16  Educator  CE  Instruction Review Code  2- meets goals/outcomes      Exercise & Equipment Safety: - Individual verbal  instruction and demonstration of equipment use and safety with use of the equipment.   Cardiac Rehab from 05/10/2016 in [facility] Cardiac and Pulmonary Rehab  Date  04/21/16  Educator  SB  Instruction Review Code  2- meets goals/outcomes      Infection Prevention: - Provides verbal and written material to individual with discussion of infection control including proper hand washing and proper equipment cleaning during exercise session.   Cardiac  Rehab from 05/10/2016 in [facility] Cardiac and Pulmonary Rehab  Date  02/15/16  Educator  C. EnterkinRN  Instruction Review Code  2- meets goals/outcomes      Falls Prevention: - Provides verbal and written material to individual with discussion of falls prevention and safety.   Cardiac Rehab from 05/10/2016 in [facility] Cardiac and Pulmonary Rehab  Date  02/15/16  Educator  C. [place]  Instruction Review Code  2- meets goals/outcomes      Diabetes: - Individual verbal and written instruction to review signs/symptoms of diabetes, desired ranges of glucose level fasting, after meals and with exercise. Advice that pre and post exercise glucose checks will be done for 3 sessions at entry of program.   Cardiac Rehab from 05/10/2016 in [facility] Cardiac and Pulmonary Rehab  Date  02/15/16       Knowledge Questionnaire Score:     Knowledge Questionnaire Score - 05/10/16 1141      Knowledge Questionnaire Score   Pre Score 27/28   Post Score 27/28      Core Components/Risk Factors/Patient Goals at Admission:     Personal Goals and Risk Factors at Admission - 02/15/16 1041      Core Components/Risk Factors/Patient Goals on Admission    Weight Management Yes;Weight Maintenance   Intervention Weight Management: Develop a combined nutrition and exercise program designed to reach desired caloric intake, while maintaining appropriate intake of nutrient and fiber, sodium and fats, and appropriate energy expenditure required for the weight goal.;Weight Management: Provide education and appropriate resources to help participant work on and attain dietary goals.   Admit Weight 131 lb 3.2 oz (59.5 kg)   Expected Outcomes Weight Maintenance: Understanding of the daily nutrition guidelines, which includes 25-35% calories from fat, 7% or less cal from saturated fats, less than '200mg'$  cholesterol, less than 1.5gm of sodium, & 5 or more servings of fruits and vegetables daily   Sedentary Yes   Intervention  Provide advice, education, support and counseling about physical activity/exercise needs.;Develop an individualized exercise prescription for aerobic and resistive training based on initial evaluation findings, risk stratification, comorbidities and participant's personal goals.   Expected Outcomes Achievement of increased cardiorespiratory fitness and enhanced flexibility, muscular endurance and strength shown through measurements of functional capacity and personal statement of participant.   Increase Strength and Stamina Yes   Intervention Provide advice, education, support and counseling about physical activity/exercise needs.;Develop an individualized exercise prescription for aerobic and resistive training based on initial evaluation findings, risk stratification, comorbidities and participant's personal goals.   Expected Outcomes Achievement of increased cardiorespiratory fitness and enhanced flexibility, muscular endurance and strength shown through measurements of functional capacity and personal statement of participant.   Hypertension Yes   Intervention Provide education on lifestyle modifcations including regular physical activity/exercise, weight management, moderate sodium restriction and increased consumption of fresh fruit, vegetables, and low fat dairy, alcohol moderation, and smoking cessation.;Monitor prescription use compliance.   Expected Outcomes Short Term: Continued assessment and intervention until BP is < 140/51m HG in hypertensive participants. < 130/850mHG in hypertensive  participants with diabetes, heart failure or chronic kidney disease.;Long Term: Maintenance of blood pressure at goal levels.   Lipids Yes   Intervention Provide education and support for participant on nutrition & aerobic/resistive exercise along with prescribed medications to achieve LDL '70mg'$ , HDL >'40mg'$ .   Expected Outcomes Short Term: Participant states understanding of desired cholesterol values and is  compliant with medications prescribed. Participant is following exercise prescription and nutrition guidelines.;Long Term: Cholesterol controlled with medications as prescribed, with individualized exercise RX and with personalized nutrition plan. Value goals: LDL < '70mg'$ , HDL > 40 mg.      Core Components/Risk Factors/Patient Goals Review:      Goals and Risk Factor Review    Row Name 03/10/16 1018 04/28/16 0918           Core Components/Risk Factors/Patient Goals Review   Personal Goals Review Weight Management/Obesity;Sedentary;Increase Strength and Stamina;Hypertension;Lipids Weight Management/Obesity;Hypertension;Lipids      Review Shadow is off to a good start in rehab.  She does not have any major concerns about the program as her incisional pain is improving..  She has already begun to notice a difference and is pleased with what she has already been able to accomplish in rehab.  She still needs to rest when she gets home, but on Tuesday she was able to also get some house work done!!  She is walking some at home as well.  Blood pressures have been good; no problems with meds. Adela Lank is doing well in rehab.  She has been feeling better.  She is still getting tired and thinks that it may be her lisinopril as it has been similar to when she had her surgery. Also her blood pressures have been running low at home and here in rehab.  She has an appointment on 4/24 and is trying to hold out until then to talk with her doctor.  She is not having any other problems with her medications.        Expected Outcomes Short: Continue to come to exercise classes and add in exercise at home.  Long: Continue to work on risk factor modification. Short: Continue to monitor blood pressure to make sure it is not too low.  Long: Continue to work on weight maintenance.         Core Components/Risk Factors/Patient Goals at Discharge (Final Review):      Goals and Risk Factor Review - 04/28/16 0918      Core  Components/Risk Factors/Patient Goals Review   Personal Goals Review Weight Management/Obesity;Hypertension;Lipids   Review Adela Lank is doing well in rehab.  She has been feeling better.  She is still getting tired and thinks that it may be her lisinopril as it has been similar to when she had her surgery. Also her blood pressures have been running low at home and here in rehab.  She has an appointment on 4/24 and is trying to hold out until then to talk with her doctor.  She is not having any other problems with her medications.     Expected Outcomes Short: Continue to monitor blood pressure to make sure it is not too low.  Long: Continue to work on weight maintenance.      ITP Comments:     ITP Comments    Row Name 02/15/16 1043 02/15/16 1237 02/15/16 1242 02/17/16 0627 03/16/16 0608   ITP Comments ITP created during Medical Review/Orientation appt. Documentation of DX CHL/EPIC Encounter dated 12/30/2015 Chronic Fatigue Syndrome but she crossed out fibromyalgi Appt  made for Duchess with the [employer]. 30 day review. Continue with ITP unless directed changes per Medical Director review. New to program 30 day review. Continue with ITP unless directed changes per Medical Director review   Row Name 04/01/16 1032 04/06/16 1049 04/12/16 1526 04/13/16 0610 05/11/16 0614   ITP Comments Jaria reported chest/arm pain on the NS. RN's P. Surles and K. Spencer noted ST depressions on tele which were also present at her prior sessions.  All vitals were within normal limits. Symptoms went away with rest. Symptoms did not reoccur on the TM or seated elliptical (XR).  Edna was instructed to call her Dr if symptoms return. Jailee was hospitalized for another MI and stent placement. She was discharged on 3/19 and has a follow up on 3/23 with Dr. Ubaldo Glassing.  I told her that we needed clearance to return and will reduced her workloads upon return. We have received Early's clearance to return to  rehab.  She will be returning on April 3.  Called to let her know. 30 day review. Continue with ITP unless directed changes per Medical Director review.   30 day review. Continue with ITP unless directed changes per Medical Director review      Comments:

## 2016-05-12 ENCOUNTER — Encounter: Payer: Medicare HMO | Admitting: *Deleted

## 2016-05-12 DIAGNOSIS — Z951 Presence of aortocoronary bypass graft: Secondary | ICD-10-CM | POA: Diagnosis not present

## 2016-05-12 NOTE — Progress Notes (Signed)
Daily Session Note  Patient Details  Name: Sheena Rice MRN: [id number] Date of Birth: 1940/03/01 Referring Provider:     Cardiac Rehab from 02/15/2016 in [facility] Cardiac and Pulmonary Rehab  Referring Provider  Bartholome Bill MD      Encounter Date: 05/12/2016  Check In:     Session Check In - 05/12/16 1045      Check-In   Location [facility]-Cardiac & Pulmonary Rehab   Staff Present Alberteen Sam, MA, ACSM RCEP, Exercise Physiologist;Amanda Oletta Darter, BA, ACSM CEP, Exercise Physiologist;Other  Darel Hong, RN, BSN   Supervising physician immediately available to respond to emergencies See telemetry face sheet for immediately available ER MD   Medication changes reported     No   Fall or balance concerns reported    No   Warm-up and Cool-down Performed on first and last piece of equipment   Resistance Training Performed Yes   VAD Patient? No     Pain Assessment   Currently in Pain? No/denies   Multiple Pain Sites No         History  Smoking Status  . Never Smoker  Smokeless Tobacco  . Never Used    Goals Met:  Independence with exercise equipment Exercise tolerated well No report of cardiac concerns or symptoms Strength training completed today  Goals Unmet:  Not Applicable  Comments: Pt able to follow exercise prescription today without complaint.  Will continue to monitor for progression. Talked to [facility] about her scores being lower for QOL and PHQ-9.  She said that she had not been feeling as well and been having more symptoms.  She was feeling better today overall.  She has a stress test scheduled for Monday about her SOB and chest pain.  She did have to take a NTG yesterday..  She feels that rehab has helped but she was doing better immediately post surgery versus post stent.   Dr. Emily Filbert is Medical Director for [place] and [facility].

## 2016-05-16 DIAGNOSIS — R079 Chest pain, unspecified: Secondary | ICD-10-CM | POA: Diagnosis not present

## 2016-05-17 ENCOUNTER — Encounter: Payer: Medicare HMO | Attending: Cardiology | Admitting: Respiratory Therapy

## 2016-05-17 DIAGNOSIS — Z951 Presence of aortocoronary bypass graft: Secondary | ICD-10-CM | POA: Diagnosis not present

## 2016-05-17 NOTE — Patient Instructions (Signed)
Discharge Progress Report  Patient Details  Name: Sheena Rice MRN: [id number] Date of Birth: [birth year] Referring Provider:  Teodoro Spray, MD   Number of Visits: 36/36  Reason for Discharge:  Patient reached a stable level of exercise. Patient independent in their exercise.  Smoking History:  History  Smoking Status  . Never Smoker  Smokeless Tobacco  . Never Used    Diagnosis:  S/P CABG x 4  Initial Exercise Prescription:     Initial Exercise Prescription - 02/15/16 1500      Date of Initial Exercise RX and Referring Provider   Date 02/15/16   Referring Provider Bartholome Bill MD     Treadmill   MPH 2.3   Grade 0   Minutes 15   METs 2.76     Bike   Level 0.5   Minutes 15   METs 2     NuStep   Level 2   Minutes 15   METs 2     Biostep-RELP   Level 2   Minutes 15   METs 2     Prescription Details   Frequency (times per week) 2   Duration Progress to 45 minutes of aerobic exercise without signs/symptoms of physical distress     Intensity   THRR 40-80% of Max Heartrate 97-130   Ratings of Perceived Exertion 11-15   Perceived Dyspnea 0-4     Progression   Progression Continue to progress workloads to maintain intensity without signs/symptoms of physical distress.     Resistance Training   Training Prescription Yes   Weight 2 lbs   Reps 10-15      Discharge Exercise Prescription (Final Exercise Prescription Changes):     Exercise Prescription Changes - 05/06/16 1600      Response to Exercise   Blood Pressure (Admit) 138/64   Blood Pressure (Exercise) 138/70   Blood Pressure (Exit) 118/58   Heart Rate (Admit) 60 bpm   Heart Rate (Exercise) 104 bpm   Heart Rate (Exit) 66 bpm   Rating of Perceived Exertion (Exercise) 15   Symptoms none   Duration Continue with 45 min of aerobic exercise without signs/symptoms of physical distress.   Intensity THRR unchanged     Progression   Progression Continue to progress workloads to maintain  intensity without signs/symptoms of physical distress.   Average METs 3.3     Resistance Training   Training Prescription Yes   Weight 3 lbs   Reps 10-15     Interval Training   Interval Training No     Treadmill   MPH 2   Grade 1   Minutes 15   METs 2.81     NuStep   Level 4   Minutes 15   METs 3.6     REL-XR   Level 3   Minutes 15   METs 3.5     Home Exercise Plan   Plans to continue exercise at Home (comment)  walking two laps   Frequency Add 3 additional days to program exercise sessions.   Initial Home Exercises Provided 03/15/16      Functional Capacity:     6 Minute Walk    Row Name 02/15/16 1526 05/03/16 1028       6 Minute Walk   Phase Initial Discharge    Distance 1260 feet 1480 feet    Distance % Change  - 17.5 %  220 ft    Walk Time 6 minutes 6 minutes    #  of Rest Breaks 0 0    MPH 2.39 2.8    METS 2.77 3.24    RPE 13 15    Perceived Dyspnea   - 2    VO2 Peak 1260 1480    Symptoms No Yes (comment)    Comments  - SOB    Resting HR 64 bpm 59 bpm    Resting BP 138/62 138/64    Max Ex. HR 88 bpm 92 bpm    Max Ex. BP 128/60 146/64    2 Minute Post BP 122/60  -       Quality of Life:     Quality of Life - 05/10/16 1142      Quality of Life Scores   Health/Function Pre 25.67 %   Health/Function Post 23.3 %   Health/Function % Change -9.23 %   Socioeconomic Pre 28.75 %   Socioeconomic Post 28.5 %   Socioeconomic % Change  -0.87 %   Psych/Spiritual Pre 30 %   Psych/Spiritual Post 26.79 %   Psych/Spiritual % Change -10.7 %   Family Pre 28.5 %   Family Post 27 %   Family % Change -5.26 %   GLOBAL Pre 27.58 %   GLOBAL Post 25.45 %   GLOBAL % Change -7.72 %      Personal Goals: Goals established at orientation with interventions provided to work toward goal.     Personal Goals and Risk Factors at Admission - 02/15/16 1041      Core Components/Risk Factors/Patient Goals on Admission    Weight Management Yes;Weight  Maintenance   Intervention Weight Management: Develop a combined nutrition and exercise program designed to reach desired caloric intake, while maintaining appropriate intake of nutrient and fiber, sodium and fats, and appropriate energy expenditure required for the weight goal.;Weight Management: Provide education and appropriate resources to help participant work on and attain dietary goals.   Admit Weight 131 lb 3.2 oz (59.5 kg)   Expected Outcomes Weight Maintenance: Understanding of the daily nutrition guidelines, which includes 25-35% calories from fat, 7% or less cal from saturated fats, less than 200mg  cholesterol, less than 1.5gm of sodium, & 5 or more servings of fruits and vegetables daily   Sedentary Yes   Intervention Provide advice, education, support and counseling about physical activity/exercise needs.;Develop an individualized exercise prescription for aerobic and resistive training based on initial evaluation findings, risk stratification, comorbidities and participant's personal goals.   Expected Outcomes Achievement of increased cardiorespiratory fitness and enhanced flexibility, muscular endurance and strength shown through measurements of functional capacity and personal statement of participant.   Increase Strength and Stamina Yes   Intervention Provide advice, education, support and counseling about physical activity/exercise needs.;Develop an individualized exercise prescription for aerobic and resistive training based on initial evaluation findings, risk stratification, comorbidities and participant's personal goals.   Expected Outcomes Achievement of increased cardiorespiratory fitness and enhanced flexibility, muscular endurance and strength shown through measurements of functional capacity and personal statement of participant.   Hypertension Yes   Intervention Provide education on lifestyle modifcations including regular physical activity/exercise, weight management,  moderate sodium restriction and increased consumption of fresh fruit, vegetables, and low fat dairy, alcohol moderation, and smoking cessation.;Monitor prescription use compliance.   Expected Outcomes Short Term: Continued assessment and intervention until BP is < 140/67mm HG in hypertensive participants. < 130/54mm HG in hypertensive participants with diabetes, heart failure or chronic kidney disease.;Long Term: Maintenance of blood pressure at goal levels.   Lipids Yes  Intervention Provide education and support for participant on nutrition & aerobic/resistive exercise along with prescribed medications to achieve LDL 70mg , HDL >40mg .   Expected Outcomes Short Term: Participant states understanding of desired cholesterol values and is compliant with medications prescribed. Participant is following exercise prescription and nutrition guidelines.;Long Term: Cholesterol controlled with medications as prescribed, with individualized exercise RX and with personalized nutrition plan. Value goals: LDL < 70mg , HDL > 40 mg.       Personal Goals Discharge:     Goals and Risk Factor Review - 04/28/16 0918      Core Components/Risk Factors/Patient Goals Review   Personal Goals Review Weight Management/Obesity;Hypertension;Lipids   Review Sheena Rice is doing well in rehab.  She has been feeling better.  She is still getting tired and thinks that it may be her lisinopril as it has been similar to when she had her surgery. Also her blood pressures have been running low at home and here in rehab.  She has an appointment on 4/24 and is trying to hold out until then to talk with her doctor.  She is not having any other problems with her medications.     Expected Outcomes Short: Continue to monitor blood pressure to make sure it is not too low.  Long: Continue to work on weight maintenance.      Nutrition & Weight - Outcomes:     Pre Biometrics - 02/15/16 1531      Pre Biometrics   Height 5' 5.4" (1.661 m)    Weight 131 lb 3.2 oz (59.5 kg)   Waist Circumference 29 inches   Hip Circumference 36 inches   Waist to Hip Ratio 0.81 %   BMI (Calculated) 21.6   Single Leg Stand 30 seconds       Nutrition:     Nutrition Therapy & Goals - 03/01/16 1152      Nutrition Therapy   Diet Patient accompanied by her husband was instructed on Heart Healthy Dietary Guidelines based on 1500 calories.   Protein (specify units) 6   Fiber 20 grams   Whole Grain Foods 3 servings   Saturated Fats 11 max. grams   Fruits and Vegetables 5 servings/day   Sodium 1500 grams     Personal Nutrition Goals   Nutrition Goal Read labels for saturated fat, trans fat and sodium.   Personal Goal #2 Include at least 6 oz protein foods daily.   Personal Goal #3 Include whole grain servings daily (oatmeal, brown rice, popcorn, corn, 100& whole wheat bread)     Intervention Plan   Intervention Prescribe, educate and counsel regarding individualized specific dietary modifications aiming towards targeted core components such as weight, hypertension, lipid management, diabetes, heart failure and other comorbidities.;Nutrition handout(s) given to patient.   Expected Outcomes Short Term Goal: Understand basic principles of dietary content, such as calories, fat, sodium, cholesterol and nutrients.;Short Term Goal: A plan has been developed with personal nutrition goals set during dietitian appointment.;Long Term Goal: Adherence to prescribed nutrition plan.      Nutrition Discharge:     Nutrition Assessments - 05/10/16 1141      MEDFICTS Scores   Pre Score 40   Post Score 44   Score Difference 4      Education Questionnaire Score:     Knowledge Questionnaire Score - 05/10/16 1141      Knowledge Questionnaire Score   Pre Score 27/28   Post Score 27/28      Goals reviewed with patient; copy given  to patient.

## 2016-05-17 NOTE — Progress Notes (Signed)
Daily Session Note  Patient Details  Name: Sheena Rice MRN: [id number] Date of Birth: 1940-07-17 Referring Provider:     Cardiac Rehab from 02/15/2016 in [facility] Cardiac [facility]  Referring Provider  Bartholome Bill MD      Encounter Date: 05/17/2016  Check In:     Session Check In - 05/17/16 0825      Check-In   Location [facility]-Cardiac & Pulmonary Rehab   Staff Present Alberteen Sam, MA, ACSM RCEP, Exercise Physiologist;Amanda Oletta Darter, BA, ACSM CEP, Exercise Physiologist;Other;Susanne Bice, RN, BSN, CCRP;Laureen Owens Shark, BS, RRT, Respiratory Therapist   Supervising physician immediately available to respond to emergencies See telemetry face sheet for immediately available ER MD   Medication changes reported     No   Fall or balance concerns reported    No   Warm-up and Cool-down Performed on first and last piece of equipment   Resistance Training Performed Yes   VAD Patient? No     Pain Assessment   Currently in Pain? No/denies   Multiple Pain Sites No         History  Smoking Status  . Never Smoker  Smokeless Tobacco  . Never Used    Goals Met:  Independence with exercise equipment Exercise tolerated well No report of cardiac concerns or symptoms Strength training completed today  Goals Unmet:  Not Applicable  Comments: Pt able to follow exercise prescription today without complaint.  Will continue to monitor for progression.    Dr. Emily Filbert is Medical Director for [place] and [facility].

## 2016-05-19 ENCOUNTER — Encounter: Payer: Medicare HMO | Admitting: *Deleted

## 2016-05-19 DIAGNOSIS — I10 Essential (primary) hypertension: Secondary | ICD-10-CM | POA: Diagnosis not present

## 2016-05-19 DIAGNOSIS — Z951 Presence of aortocoronary bypass graft: Secondary | ICD-10-CM | POA: Diagnosis not present

## 2016-05-19 NOTE — Progress Notes (Signed)
Daily Session Note  Patient Details  Name: Sheena Rice MRN: [id number] Date of Birth: [birth year] Referring Provider:     Cardiac Rehab from 02/15/2016 in [facility] Cardiac [facility] Pul[facility] Rehab  Referring Provider  Bartholome Bill MD      Encounter Date: 05/19/2016  Check In:     Session Check In - 05/19/16 1037      Check-In   Location [facility]-Cardiac & Pulmonary Rehab   Staff Present Alberteen Sam, MA, ACSM RCEP, Exercise Physiologist;Amanda Oletta Darter, BA, ACSM CEP, Exercise Physiologist;Other  Darel Hong, RN, BSN   Supervising physician immediately available to respond to emergencies See telemetry face sheet for immediately available ER MD   Medication changes reported     No   Fall or balance concerns reported    No   Warm-up and Cool-down Performed on first and last piece of equipment   Resistance Training Performed Yes   VAD Patient? No     Pain Assessment   Currently in Pain? No/denies   Multiple Pain Sites No           Exercise Prescription Changes - 05/18/16 1500      Response to Exercise   Blood Pressure (Admit) 124/56   Blood Pressure (Exercise) 152/60   Blood Pressure (Exit) 126/56   Heart Rate (Admit) 70 bpm   Heart Rate (Exercise) 115 bpm   Heart Rate (Exit) 66 bpm   Rating of Perceived Exertion (Exercise) 13   Symptoms none   Duration Continue with 45 min of aerobic exercise without signs/symptoms of physical distress.   Intensity THRR unchanged     Progression   Progression Continue to progress workloads to maintain intensity without signs/symptoms of physical distress.   Average METs 3.57     Resistance Training   Training Prescription Yes   Weight 3 lbs   Reps 10-15     Interval Training   Interval Training No     Treadmill   MPH 2   Grade 1   Minutes 15   METs 2.81     NuStep   Level 4   Minutes 15   METs 3.6     REL-XR   Level 3   Minutes 15   METs 4.3     Home Exercise Plan   Plans to continue exercise at Home (comment)   walking two laps   Frequency Add 3 additional days to program exercise sessions.   Initial Home Exercises Provided 03/15/16      History  Smoking Status  . Never Smoker  Smokeless Tobacco  . Never Used    Goals Met:  Independence with exercise equipment Exercise tolerated well No report of cardiac concerns or symptoms Strength training completed today  Goals Unmet:  Not Applicable  Comments:  Sheena Rice graduated today from cardiac rehab with 36/36 sessions completed.  Details of the patient's exercise prescription and what She needs to do in order to continue the prescription and progress were discussed with patient.  Patient was given a copy of prescription and goals.  Patient verbalized understanding.  Sheena Rice plans to continue to exercise by walking at home.    Dr. Emily Filbert is Medical Director for [place] and [facility].

## 2016-05-19 NOTE — Progress Notes (Signed)
Discharge Summary  Patient Details  Name: Sheena Rice MRN: [id number] Date of Birth: [birth year] Referring Provider:     Cardiac Rehab from 02/15/2016 in [facility]  Referring Provider  Bartholome Bill MD       Number of Visits: 36/36  Reason for Discharge:  Patient reached a stable level of exercise. Patient independent in their exercise.  Smoking History:  History  Smoking Status  . Never Smoker  Smokeless Tobacco  . Never Used    Diagnosis:  S/P CABG x 4  ADL UCSD:   Initial Exercise Prescription:     Initial Exercise Prescription - 02/15/16 1500      Date of Initial Exercise RX and Referring Provider   Date 02/15/16   Referring Provider Bartholome Bill MD     Treadmill   MPH 2.3   Grade 0   Minutes 15   METs 2.76     Bike   Level 0.5   Minutes 15   METs 2     NuStep   Level 2   Minutes 15   METs 2     Biostep-RELP   Level 2   Minutes 15   METs 2     Prescription Details   Frequency (times per week) 2   Duration Progress to 45 minutes of aerobic exercise without signs/symptoms of physical distress     Intensity   THRR 40-80% of Max Heartrate 97-130   Ratings of Perceived Exertion 11-15   Perceived Dyspnea 0-4     Progression   Progression Continue to progress workloads to maintain intensity without signs/symptoms of physical distress.     Resistance Training   Training Prescription Yes   Weight 2 lbs   Reps 10-15      Discharge Exercise Prescription (Final Exercise Prescription Changes):     Exercise Prescription Changes - 05/18/16 1500      Response to Exercise   Blood Pressure (Admit) 124/56   Blood Pressure (Exercise) 152/60   Blood Pressure (Exit) 126/56   Heart Rate (Admit) 70 bpm   Heart Rate (Exercise) 115 bpm   Heart Rate (Exit) 66 bpm   Rating of Perceived Exertion (Exercise) 13   Symptoms none   Duration Continue with 45 min of aerobic exercise without signs/symptoms of physical distress.   Intensity THRR unchanged     Progression   Progression Continue to progress workloads to maintain intensity without signs/symptoms of physical distress.   Average METs 3.57     Resistance Training   Training Prescription Yes   Weight 3 lbs   Reps 10-15     Interval Training   Interval Training No     Treadmill   MPH 2   Grade 1   Minutes 15   METs 2.81     NuStep   Level 4   Minutes 15   METs 3.6     REL-XR   Level 3   Minutes 15   METs 4.3     Home Exercise Plan   Plans to continue exercise at Home (comment)  walking two laps   Frequency Add 3 additional days to program exercise sessions.   Initial Home Exercises Provided 03/15/16      Functional Capacity:     6 Minute Walk    Row Name 02/15/16 1526 05/03/16 1028       6 Minute Walk   Phase Initial Discharge    Distance 1260 feet 1480 feet    Distance %  Change  - 17.5 %  220 ft    Walk Time 6 minutes 6 minutes    # of Rest Breaks 0 0    MPH 2.39 2.8    METS 2.77 3.24    RPE 13 15    Perceived Dyspnea   - 2    VO2 Peak 1260 1480    Symptoms No Yes (comment)    Comments  - SOB    Resting HR 64 bpm 59 bpm    Resting BP 138/62 138/64    Max Ex. HR 88 bpm 92 bpm    Max Ex. BP 128/60 146/64    2 Minute Post BP 122/60  -       Psychological, QOL, Others - Outcomes: PHQ 2/9: Depression screen [facility] 2/9 05/10/2016 02/15/2016  Decreased Interest 1 0  Down, Depressed, Hopeless 1 0  PHQ - 2 Score 2 0  Altered sleeping 0 0  Tired, decreased energy 3 1  Change in appetite 0 0  Feeling bad or failure about yourself  0 0  Trouble concentrating 0 0  Moving slowly or fidgety/restless 0 0  Suicidal thoughts 0 0  PHQ-9 Score 5 1  Difficult doing work/chores Somewhat difficult Not difficult at all    Quality of Life:     Quality of Life - 05/10/16 1142      Quality of Life Scores   Health/Function Pre 25.67 %   Health/Function Post 23.3 %   Health/Function % Change -9.23 %   Socioeconomic Pre 28.75  %   Socioeconomic Post 28.5 %   Socioeconomic % Change  -0.87 %   Psych/Spiritual Pre 30 %   Psych/Spiritual Post 26.79 %   Psych/Spiritual % Change -10.7 %   Family Pre 28.5 %   Family Post 27 %   Family % Change -5.26 %   GLOBAL Pre 27.58 %   GLOBAL Post 25.45 %   GLOBAL % Change -7.72 %      Personal Goals: Goals established at orientation with interventions provided to work toward goal.     Personal Goals and Risk Factors at Admission - 02/15/16 1041      Core Components/Risk Factors/Patient Goals on Admission    Weight Management Yes;Weight Maintenance   Intervention Weight Management: Develop a combined nutrition and exercise program designed to reach desired caloric intake, while maintaining appropriate intake of nutrient and fiber, sodium and fats, and appropriate energy expenditure required for the weight goal.;Weight Management: Provide education and appropriate resources to help participant work on and attain dietary goals.   Admit Weight 131 lb 3.2 oz (59.5 kg)   Expected Outcomes Weight Maintenance: Understanding of the daily nutrition guidelines, which includes 25-35% calories from fat, 7% or less cal from saturated fats, less than 200mg  cholesterol, less than 1.5gm of sodium, & 5 or more servings of fruits and vegetables daily   Sedentary Yes   Intervention Provide advice, education, support and counseling about physical activity/exercise needs.;Develop an individualized exercise prescription for aerobic and resistive training based on initial evaluation findings, risk stratification, comorbidities and participant's personal goals.   Expected Outcomes Achievement of increased cardiorespiratory fitness and enhanced flexibility, muscular endurance and strength shown through measurements of functional capacity and personal statement of participant.   Increase Strength and Stamina Yes   Intervention Provide advice, education, support and counseling about physical  activity/exercise needs.;Develop an individualized exercise prescription for aerobic and resistive training based on initial evaluation findings, risk stratification, comorbidities and participant's personal goals.  Expected Outcomes Achievement of increased cardiorespiratory fitness and enhanced flexibility, muscular endurance and strength shown through measurements of functional capacity and personal statement of participant.   Hypertension Yes   Intervention Provide education on lifestyle modifcations including regular physical activity/exercise, weight management, moderate sodium restriction and increased consumption of fresh fruit, vegetables, and low fat dairy, alcohol moderation, and smoking cessation.;Monitor prescription use compliance.   Expected Outcomes Short Term: Continued assessment and intervention until BP is < 140/71mm HG in hypertensive participants. < 130/59mm HG in hypertensive participants with diabetes, heart failure or chronic kidney disease.;Long Term: Maintenance of blood pressure at goal levels.   Lipids Yes   Intervention Provide education and support for participant on nutrition & aerobic/resistive exercise along with prescribed medications to achieve LDL 70mg , HDL >40mg .   Expected Outcomes Short Term: Participant states understanding of desired cholesterol values and is compliant with medications prescribed. Participant is following exercise prescription and nutrition guidelines.;Long Term: Cholesterol controlled with medications as prescribed, with individualized exercise RX and with personalized nutrition plan. Value goals: LDL < 70mg , HDL > 40 mg.       Personal Goals Discharge:     Goals and Risk Factor Review    Row Name 03/10/16 1018 04/28/16 0918           Core Components/Risk Factors/Patient Goals Review   Personal Goals Review Weight Management/Obesity;Sedentary;Increase Strength and Stamina;Hypertension;Lipids Weight  Management/Obesity;Hypertension;Lipids      Review Sheena Rice is off to a good start in rehab.  She does not have any major concerns about the program as her incisional pain is improving..  She has already begun to notice a difference and is pleased with what she has already been able to accomplish in rehab.  She still needs to rest when she gets home, but on Tuesday she was able to also get some house work done!!  She is walking some at home as well.  Blood pressures have been good; no problems with meds. Sheena Rice is doing well in rehab.  She has been feeling better.  She is still getting tired and thinks that it may be her lisinopril as it has been similar to when she had her surgery. Also her blood pressures have been running low at home and here in rehab.  She has an appointment on 4/24 and is trying to hold out until then to talk with her doctor.  She is not having any other problems with her medications.        Expected Outcomes Short: Continue to come to exercise classes and add in exercise at home.  Long: Continue to work on risk factor modification. Short: Continue to monitor blood pressure to make sure it is not too low.  Long: Continue to work on weight maintenance.         Nutrition & Weight - Outcomes:     Pre Biometrics - 02/15/16 1531      Pre Biometrics   Height 5' 5.4" (1.661 m)   Weight 131 lb 3.2 oz (59.5 kg)   Waist Circumference 29 inches   Hip Circumference 36 inches   Waist to Hip Ratio 0.81 %   BMI (Calculated) 21.6   Single Leg Stand 30 seconds       Nutrition:     Nutrition Therapy & Goals - 03/01/16 1152      Nutrition Therapy   Diet Patient accompanied by her husband was instructed on Heart Healthy Dietary Guidelines based on 1500 calories.   Protein (specify units) 6  Fiber 20 grams   Whole Grain Foods 3 servings   Saturated Fats 11 max. grams   Fruits and Vegetables 5 servings/day   Sodium 1500 grams     Personal Nutrition Goals   Nutrition Goal Read  labels for saturated fat, trans fat and sodium.   Personal Goal #2 Include at least 6 oz protein foods daily.   Personal Goal #3 Include whole grain servings daily (oatmeal, brown rice, popcorn, corn, 100& whole wheat bread)     Intervention Plan   Intervention Prescribe, educate and counsel regarding individualized specific dietary modifications aiming towards targeted core components such as weight, hypertension, lipid management, diabetes, heart failure and other comorbidities.;Nutrition handout(s) given to patient.   Expected Outcomes Short Term Goal: Understand basic principles of dietary content, such as calories, fat, sodium, cholesterol and nutrients.;Short Term Goal: A plan has been developed with personal nutrition goals set during dietitian appointment.;Long Term Goal: Adherence to prescribed nutrition plan.      Nutrition Discharge:     Nutrition Assessments - 05/10/16 1141      MEDFICTS Scores   Pre Score 40   Post Score 44   Score Difference 4      Education Questionnaire Score:     Knowledge Questionnaire Score - 05/10/16 1141      Knowledge Questionnaire Score   Pre Score 27/28   Post Score 27/28      Goals reviewed with patient; copy given to patient.

## 2016-05-19 NOTE — Progress Notes (Signed)
Cardiac Individual Treatment Plan  Patient Details  Name: Sheena Rice MRN: [id number] Date of Birth: 02/09/1940 Referring Provider:     Cardiac Rehab from 02/15/2016 in [facility] Cardiac and Pulmonary Rehab  Referring Provider  Bartholome Bill MD      Initial Encounter Date:    Cardiac Rehab from 02/15/2016 in [facility] Cardiac and Pulmonary Rehab  Date  02/15/16  Referring Provider  Bartholome Bill MD      Visit Diagnosis: S/P CABG x 4  Patient's Home Medications on Admission:  Current Outpatient Prescriptions:  .  aspirin EC 81 MG EC tablet, Take 1 tablet (81 mg total) by mouth daily., Disp: 30 tablet, Rfl: 0 .  Cholecalciferol (VITAMIN D3) 5000 units CAPS, Take 5,000 Units by mouth daily. , Disp: , Rfl:  .  co-enzyme Q-10 30 MG capsule, Take 300 mg by mouth daily., Disp: , Rfl:  .  colestipol (COLESTID) 1 g tablet, Take 2 g by mouth 2 (two) times daily. , Disp: , Rfl:  .  cyclobenzaprine (FLEXERIL) 10 MG tablet, Take 10 mg by mouth at bedtime. , Disp: , Rfl:  .  esomeprazole (NEXIUM) 20 MG capsule, Take 20 mg by mouth every morning. , Disp: , Rfl:  .  Flaxseed, Linseed, (FLAX SEED OIL PO), Take 1,400 mg by mouth daily., Disp: , Rfl:  .  gemfibrozil (LOPID) 600 MG tablet, Take 600 mg by mouth 2 (two) times daily. , Disp: , Rfl:  .  isosorbide mononitrate (IMDUR) 30 MG 24 hr tablet, Take 1 tablet (30 mg total) by mouth daily., Disp: 30 tablet, Rfl: 5 .  lisinopril (PRINIVIL,ZESTRIL) 2.5 MG tablet, Take 1 tablet (2.5 mg total) by mouth daily., Disp: 30 tablet, Rfl: 5 .  Magnesium Oxide 400 MG CAPS, Take 400 mg by mouth 2 (two) times daily. , Disp: , Rfl:  .  metoprolol tartrate (LOPRESSOR) 25 MG tablet, Take 1 tablet (25 mg total) by mouth 2 (two) times daily., Disp: 30 tablet, Rfl: 3 .  Probiotic Product (PROBIOTIC DAILY) CAPS, Take 1 capsule by mouth daily., Disp: , Rfl:  .  rosuvastatin (CRESTOR) 10 MG tablet, Take 1 tablet (10 mg total) by mouth daily at 6 PM., Disp: 30 tablet, Rfl: 5 .   ticagrelor ([name]) 90 MG TABS tablet, Take 1 tablet (90 mg total) by mouth 2 (two) times daily., Disp: 60 tablet, Rfl: 5 No current facility-administered medications for this visit.   Facility-Administered Medications Ordered in Other Visits:  .  0.9 %  sodium chloride infusion, 250 mL, Intravenous, PRN, Teodoro Spray, MD .  [EXPIRED] 0.9% sodium chloride infusion, 3 mL/kg/hr, Intravenous, Continuous **FOLLOWED BY** 0.9% sodium chloride infusion, 1 mL/kg/hr, Intravenous, Continuous, Teodoro Spray, MD .  sodium chloride flush (NS) 0.9 % injection 3 mL, 3 mL, Intravenous, Q12H, Teodoro Spray, MD .  sodium chloride flush (NS) 0.9 % injection 3 mL, 3 mL, Intravenous, PRN, Teodoro Spray, MD  Past Medical History: Past Medical History:  Diagnosis Date  . Arthritis    OA  . Chronic fatigue   . Coronary artery disease   . GERD (gastroesophageal reflux disease)   . Hypertension   . Unstable angina (HCC)     Tobacco Use: History  Smoking Status  . Never Smoker  Smokeless Tobacco  . Never Used    Labs: Recent Review Flowsheet Data    Labs for ITP Cardiac and Pulmonary Rehab Latest Ref Rng & Units 12/31/2015 12/31/2015 12/31/2015 01/01/2016 04/02/2016   Cholestrol  0 - 200 mg/dL - - - - 199   LDLCALC 0 - 99 mg/dL - - - - 139(H)   HDL >40 mg/dL - - - - 35(L)   Trlycerides <150 mg/dL - - - - 127   Hemoglobin A1c 4.8 - 5.6 % - - - - -   PHART 7.350 - 7.450 7.325(L) - 7.306(L) - -   PCO2ART 32.0 - 48.0 mmHg 42.5 - 41.8 - -   HCO3 20.0 - 28.0 mmol/L 22.3 - 21.0 - -   TCO2 0 - 100 mmol/L _0 -   ACIDBASEDEF 0.0 - 2.0 mmol/L 4.0(H) - 5.0(H) - -   O2SAT % 97.0 - 97.0 - -       Exercise Target Goals:    Exercise Program Goal: Individual exercise prescription set with THRR, safety & activity barriers. Participant demonstrates ability to understand and report RPE using BORG scale, to self-measure pulse accurately, and to acknowledge the importance of the exercise  prescription.  Exercise Prescription Goal: Starting with aerobic activity 30 plus minutes a day, 3 days per week for initial exercise prescription. Provide home exercise prescription and guidelines that participant acknowledges understanding prior to discharge.  Activity Barriers & Risk Stratification:     Activity Barriers & Cardiac Risk Stratification - 02/15/16 1236      Activity Barriers & Cardiac Risk Stratification   Activity Barriers Deconditioning;Muscular Weakness;Incisional Pain;Other (comment)   Comments Chronic Fatigue Syndrome but she crossed out fibromyalgia   Cardiac Risk Stratification High      6 Minute Walk:     6 Minute Walk    Row Name 02/15/16 1526 05/03/16 1028       6 Minute Walk   Phase Initial Discharge    Distance 1260 feet 1480 feet    Distance % Change  - 17.5 %  220 ft    Walk Time 6 minutes 6 minutes    # of Rest Breaks 0 0    MPH 2.39 2.8    METS 2.77 3.24    RPE 13 15    Perceived Dyspnea   - 2    VO2 Peak 1260 1480    Symptoms No Yes (comment)    Comments  - SOB    Resting HR 64 bpm 59 bpm    Resting BP 138/62 138/64    Max Ex. HR 88 bpm 92 bpm    Max Ex. BP 128/60 146/64    2 Minute Post BP 122/60  -       Oxygen Initial Assessment:   Oxygen Re-Evaluation:   Oxygen Discharge (Final Oxygen Re-Evaluation):   Initial Exercise Prescription:     Initial Exercise Prescription - 02/15/16 1500      Date of Initial Exercise RX and Referring Provider   Date 02/15/16   Referring Provider Bartholome Bill MD     Treadmill   MPH 2.3   Grade 0   Minutes 15   METs 2.76     Bike   Level 0.5   Minutes 15   METs 2     NuStep   Level 2   Minutes 15   METs 2     Biostep-RELP   Level 2   Minutes 15   METs 2     Prescription Details   Frequency (times per week) 2   Duration Progress to 45 minutes of aerobic exercise without signs/symptoms of physical distress     Intensity   THRR 40-80% of Max  Heartrate 97-130    Ratings of Perceived Exertion 11-15   Perceived Dyspnea 0-4     Progression   Progression Continue to progress workloads to maintain intensity without signs/symptoms of physical distress.     Resistance Training   Training Prescription Yes   Weight 2 lbs   Reps 10-15      Perform Capillary Blood Glucose checks as needed.  Exercise Prescription Changes:     Exercise Prescription Changes    Row Name 02/15/16 1500 02/24/16 1500 03/09/16 1500 03/23/16 1500 04/06/16 1000     Response to Exercise   Blood Pressure (Admit) 138/62 128/74 128/60 134/62 118/64   Blood Pressure (Exercise) 128/60 160/80 134/64 140/66 138/64   Blood Pressure (Exit) 122/60 126/64 144/62 134/70 112/54   Heart Rate (Admit) 64 bpm 74 bpm 76 bpm 76 bpm 73 bpm   Heart Rate (Exercise) 88 bpm 98 bpm 119 bpm 116 bpm 80 bpm   Heart Rate (Exit) 70 bpm 70 bpm 62 bpm 75 bpm 64 bpm   Oxygen Saturation (Admit) 93 %  -  -  -  -   Oxygen Saturation (Exercise) 100 %  -  -  -  -   Rating of Perceived Exertion (Exercise) _0 Symptoms none none none none L arm pain and SOB   Comments  -  -  -  - later that evening she went to ER with MI   Duration  - Progress to 30 minutes of continuous aerobic without signs/symptoms of physical distress Progress to 45 minutes of aerobic exercise without signs/symptoms of physical distress Continue with 45 min of aerobic exercise without signs/symptoms of physical distress. Progress to 45 minutes of aerobic exercise without signs/symptoms of physical distress   Intensity  - THRR unchanged THRR unchanged THRR unchanged THRR unchanged     Progression   Progression  - Continue to progress workloads to maintain intensity without signs/symptoms of physical distress. Continue to progress workloads to maintain intensity without signs/symptoms of physical distress. Continue to progress workloads to maintain intensity without signs/symptoms of physical distress. Continue to progress workloads  to maintain intensity without signs/symptoms of physical distress.   Average METs  - 2.49 3.65 5.82 3.1     Resistance Training   Training Prescription  - Yes Yes Yes Yes   Weight  - 2 lbs 2 lbs 2 lbs 2 lbs   Reps  - 10-15 10-15 10-15 10-15     Interval Training   Interval Training  - No No Yes Yes   Equipment  -  -  - REL-XR REL-XR   Comments  -  -  - Added some intervals on XR  Added some intervals on XR      Treadmill   MPH  - 2.3 2.5 2.6 2   Grade  - 0 _1 Minutes  - _2 METs  - 2.76 3.24 3.35 2.81     Bike   Level  - 0.5  -  -  -   Minutes  - 15  -  -  -   METs  - 2.5  -  -  -     NuStep   Level  - _3 Minutes  - _4 METs  - 2.4 3.1 3.1 2.9     REL-XR   Level  -  - 1 3 3  Minutes  -  - _0 METs  -  - 4.6 10.1 3.6     Home Exercise Plan   Plans to continue exercise at  -  -  - Home (comment)  walking two laps Home (comment)  walking two laps   Frequency  -  -  - Add 3 additional days to program exercise sessions. Add 3 additional days to program exercise sessions.   Initial Home Exercises Provided  -  -  - 03/15/16 03/15/16     Exercise Review   Progression -  walk test results Yes Yes  -  -   Row Name 04/21/16 1500 05/06/16 1600 05/18/16 1500         Response to Exercise   Blood Pressure (Admit) 116/64 138/64 124/56     Blood Pressure (Exercise) 128/62 138/70 152/60     Blood Pressure (Exit) 104/60 118/58 126/56     Heart Rate (Admit) 84 bpm 60 bpm 70 bpm     Heart Rate (Exercise) 109 bpm 104 bpm 115 bpm     Heart Rate (Exit) 63 bpm 66 bpm 66 bpm     Rating of Perceived Exertion (Exercise) _1 Symptoms none none none     Duration Progress to 45 minutes of aerobic exercise without signs/symptoms of physical distress Continue with 45 min of aerobic exercise without signs/symptoms of physical distress. Continue with 45 min of aerobic exercise without signs/symptoms of physical distress.     Intensity THRR  unchanged THRR unchanged THRR unchanged       Progression   Progression Continue to progress workloads to maintain intensity without signs/symptoms of physical distress. Continue to progress workloads to maintain intensity without signs/symptoms of physical distress. Continue to progress workloads to maintain intensity without signs/symptoms of physical distress.     Average METs 2.91 3.3 3.57       Resistance Training   Training Prescription Yes Yes Yes     Weight 2 lbs 3 lbs 3 lbs     Reps 10-15 10-15 10-15       Interval Training   Interval Training No No No       Treadmill   MPH 1._2 Grade 0 1 1     Minutes _3 METs 2.15 2.81 2.81       NuStep   Level _4 Minutes _5 METs 3.1 3.6 3.6       REL-XR   Level _6 Minutes _7 METs 3.5 3.5 4.3       Home Exercise Plan   Plans to continue exercise at Home (comment)  walking two laps Home (comment)  walking two laps Home (comment)  walking two laps     Frequency Add 3 additional days to program exercise sessions. Add 3 additional days to program exercise sessions. Add 3 additional days to program exercise sessions.     Initial Home Exercises Provided 03/15/16 03/15/16 03/15/16        Exercise Comments:     Exercise Comments    Row Name 02/18/16 8110 02/24/16 1538 03/09/16 1538 03/17/16 1040 03/24/16 0955   Exercise Comments First full day of exercise!  Patient was oriented to gym and equipment including functions, settings, policies, and procedures.  Patient's individual exercise  prescription and treatment plan were reviewed.  All starting workloads were established based on the results of the 6 minute walk test done at initial orientation visit.  The plan for exercise progression was also introduced and progression will be customized based on patient's performance and goals. Adela Lank is off to a good start in rehab.  She has mentioned that she was tired after her first day and  needed a nap.  I encouraged her that this is normal and hopefully her stamina will improve.  We will continue to monitor her progression. Adela Lank has continued to do well in rehab.  She is still getting her nap, but feels that she has earned her nap after rehab.  She has traded the bike out for the recumbent elliptical and finds it more comfortable but challenging.  We will continue to monitor her progress. MET levels reviewed today. Reviewed METs average and discussed progression with pt today.   Row Name 03/31/16 1116 04/01/16 1027 04/01/16 1031 04/19/16 0909 05/19/16 1039   Exercise Comments Shellene reported chest/arm pain on the NS.  Symptoms went away with rest.  All vitals were within normal limits.  Symptoms did not reoccur on the TM or seated elliptical (XR).  Torra was instructed to call her Dr if symptoms return. Ludell reported chest/arm pain on the NS. RN's P. Surles and K. Spencer noted ST depressions on tele which were also present at her prior sessions.  All vitals were within normal limits. Symptoms went away with rest. Symptoms did not reoccur on the TM or seated elliptical (XR).  Ayme was instructed to call her Dr if symptoms return. Markesia reported chest/arm pain on the NS. RN's P. Surles and K. Spencer noted ST depressions on tele which were also present at her prior sessions.  All vitals were within normal limits. Symptoms went away with rest. Symptoms did not reoccur on the TM or seated elliptical (XR).  Vance was instructed to call her Dr if symptoms return. Taetum returned today after being out for another stent and MI.  She was able to return with some reduced workloads. Nema graduated today from cardiac rehab with 36/36 sessions completed.  Details of the patient's exercise prescription and what She needs to do in order to continue the prescription and progress were discussed with patient.  Patient was given a copy of prescription and goals.  Patient verbalized understanding.  Sanyiah  plans to continue to exercise by walking at home.      Exercise Goals and Review:   Exercise Goals Re-Evaluation :     Exercise Goals Re-Evaluation    Row Name 03/23/16 1544 04/06/16 1051 04/21/16 1526 04/28/16 0949 05/06/16 1628     Exercise Goal Re-Evaluation   Exercise Goals Review Increase Physical Activity;Increase Strenth and Stamina Increase Physical Activity;Increase Strenth and Stamina Increase Physical Activity;Increase Strenth and Stamina Increase Physical Activity;Increase Strenth and Stamina Increase Physical Activity;Increase Strenth and Stamina   Comments Kriya has been doing well in rehab.  She has noticed an improvement in her stamina and her ability to do more around the house.  She has been able to make several increases on both the XR and the treadmill.  We will continue to monitor her progression. Up until Thursday, Makynleigh has been doing well in rehab. We will now reduce her workloads when she returns and then begin to progress from there. Timber returned this week after her heart event. She is working her way back to where she was before.  We  will continue to monitor her progress. Monta is feeling better and starting to feel stronger again.  We looked at her METs to see that she is about where she left off before we started doing intervals.  She is able to do just about every thing she wants, biggest culprit still are the stairs and inclines in her neighborhood.  She is walking at home some. Aprel is doing well in rehab.  She improved her post 6MWT by 17%!  She is still walking at home as well.  We will continue to monitor her progression.   Expected Outcomes Short: Ciclaly will increase her home walking to two laps versus just one lap (reviewed on 03/15/16 see progress note).  Long;  Veralyn will continue to build her strength and stamina. Short: Mitzi will return to rehab.  Long: Work on increasing workloads back to where she was before her MI. Short;  Voula will work her  way back up.  Long: She will continue to come to classes to work on strength and stamina. Short: Liane will continue to work on progressing her workloads.  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 Short: Vanshika will be graduating.  Long: She will continue to exercise to improve her strength and stamina.   [place] Name 05/18/16 1539             Exercise Goal Re-Evaluation   Exercise Goals Review Increase Physical Activity;Increase Strenth and Stamina       Comments [employer]!!  She has done great and plans to continue to exercise at home by walking.         Expected Outcomes Digna will continue to keep exercise as part of her daily life!          Discharge Exercise Prescription (Final Exercise Prescription Changes):     Exercise Prescription Changes - 05/18/16 1500      Response to Exercise   Blood Pressure (Admit) 124/56   Blood Pressure (Exercise) 152/60   Blood Pressure (Exit) 126/56   Heart Rate (Admit) 70 bpm   Heart Rate (Exercise) 115 bpm   Heart Rate (Exit) 66 bpm   Rating of Perceived Exertion (Exercise) 13   Symptoms none   Duration Continue with 45 min of aerobic exercise without signs/symptoms of physical distress.   Intensity THRR unchanged     Progression   Progression Continue to progress workloads to maintain intensity without signs/symptoms of physical distress.   Average METs 3.57     Resistance Training   Training Prescription Yes   Weight 3 lbs   Reps 10-15     Interval Training   Interval Training No     Treadmill   MPH 2   Grade 1   Minutes 15   METs 2.81     NuStep   Level 4   Minutes 15   METs 3.6     REL-XR   Level 3   Minutes 15   METs 4.3     Home Exercise Plan   Plans to continue exercise at Home (comment)  walking two laps   Frequency Add 3 additional days to program exercise sessions.   Initial Home Exercises Provided 03/15/16      Nutrition:  Target Goals: Understanding of  nutrition guidelines, daily intake of sodium <1547m, cholesterol <2037m calories 30% from fat and 7% or less from saturated fats, daily to have 5 or more servings of fruits and vegetables.  Biometrics:  Pre Biometrics - 02/15/16 1531      Pre Biometrics   Height 5' 5.4" (1.661 m)   Weight 131 lb 3.2 oz (59.5 kg)   Waist Circumference 29 inches   Hip Circumference 36 inches   Waist to Hip Ratio 0.81 %   BMI (Calculated) 21.6   Single Leg Stand 30 seconds       Nutrition Therapy Plan and Nutrition Goals:     Nutrition Therapy & Goals - 03/01/16 1152      Nutrition Therapy   Diet Patient accompanied by her husband was instructed on Heart Healthy Dietary Guidelines based on 1500 calories.   Protein (specify units) 6   Fiber 20 grams   Whole Grain Foods 3 servings   Saturated Fats 11 max. grams   Fruits and Vegetables 5 servings/day   Sodium 1500 grams     Personal Nutrition Goals   Nutrition Goal Read labels for saturated fat, trans fat and sodium.   Personal Goal #2 Include at least 6 oz protein foods daily.   Personal Goal #3 Include whole grain servings daily (oatmeal, brown rice, popcorn, corn, 100& whole wheat bread)     Intervention Plan   Intervention Prescribe, educate and counsel regarding individualized specific dietary modifications aiming towards targeted core components such as weight, hypertension, lipid management, diabetes, heart failure and other comorbidities.;Nutrition handout(s) given to patient.   Expected Outcomes Short Term Goal: Understand basic principles of dietary content, such as calories, fat, sodium, cholesterol and nutrients.;Short Term Goal: A plan has been developed with personal nutrition goals set during dietitian appointment.;Long Term Goal: Adherence to prescribed nutrition plan.      Nutrition Discharge: Rate Your Plate Scores:     Nutrition Assessments - 05/10/16 1141      MEDFICTS Scores   Pre Score 40   Post Score 44    Score Difference 4      Nutrition Goals Re-Evaluation:     Nutrition Goals Re-Evaluation    Row Name 03/10/16 1021 04/28/16 0915           Goals   Current Weight  - 133 lb (60.3 kg)      Nutrition Goal Read labels for saturated fat, trans fat and sodium. Reading labels, eating more protien, whole grains      Comment Reading more labels Adela Lank is doig well with her diet.  They are eating more at home and reading the food labels. Her husband does most of the cooking.  She is still struggling to get enough protein.  We discussed some other alternatives to sneak her protien in like putting nuts on her oatmeal and adding a little cheese to meals.  She is trying to do better and making healthier choices.      Expected Outcome  - Short: Continue to work on protein intake.  Long: Continue with heart healthy diet.        Personal Goal #1 Re-Evaluation   Goal Progress Seen Yes  -        Personal Goal #2 Re-Evaluation   Personal Goal #2 Include at least 6 oz protein foods daily.  -      Goal Progress Seen Yes  -      Re-Evaluation Eating more protein  -        Personal Goal #3 Re-Evaluation   Personal Goal #3 Include whole grain servings daily (oatmeal, brown rice, popcorn, corn, 100& whole wheat bread)  -      Goal Progress Seen  Yes  -      Re-Evaluation Looking for whole grains  -        Intervention Plan   Intervention Continue to educate, counsel and set short/long term goals regarding individualized specific personal dietary modifications.  -      Comments Khia and her husband have cut back on eating out and are more mindful when they do go out to eat. Their friends have even begun to notice the changes in thier diets. They are also eating more friuts and vegetables as well. Goals: Continue to work on improving diet and sticking with it.  -         Nutrition Goals Discharge (Final Nutrition Goals Re-Evaluation):     Nutrition Goals Re-Evaluation - 04/28/16 0915      Goals    Current Weight 133 lb (60.3 kg)   Nutrition Goal Reading labels, eating more protien, whole grains   Comment Shelia is doig well with her diet.  They are eating more at home and reading the food labels. Her husband does most of the cooking.  She is still struggling to get enough protein.  We discussed some other alternatives to sneak her protien in like putting nuts on her oatmeal and adding a little cheese to meals.  She is trying to do better and making healthier choices.   Expected Outcome Short: Continue to work on protein intake.  Long: Continue with heart healthy diet.      Psychosocial: Target Goals: Acknowledge presence or absence of significant depression and/or stress, maximize coping skills, provide positive support system. Participant is able to verbalize types and ability to use techniques and skills needed for reducing stress and depression.   Initial Review & Psychosocial Screening:     Initial Psych Review & Screening - 02/15/16 [place]? Yes   Comments History Chronic Fatigue Syndrome since 1991. Flare ups occur     Screening Interventions   Interventions Encouraged to exercise      Quality of Life Scores:      Quality of Life - 05/10/16 1142      Quality of Life Scores   Health/Function Pre 25.67 %   Health/Function Post 23.3 %   Health/Function % Change -9.23 %   Socioeconomic Pre 28.75 %   Socioeconomic Post 28.5 %   Socioeconomic % Change  -0.87 %   Psych/Spiritual Pre 30 %   Psych/Spiritual Post 26.79 %   Psych/Spiritual % Change -10.7 %   Family Pre 28.5 %   Family Post 27 %   Family % Change -5.26 %   GLOBAL Pre 27.58 %   GLOBAL Post 25.45 %   GLOBAL % Change -7.72 %      PHQ-9: Recent Review Flowsheet Data    Depression screen [facility] 2/9 05/10/2016 02/15/2016   Decreased Interest 1 0   Down, Depressed, Hopeless 1 0   PHQ - 2 Score 2 0   Altered sleeping 0 0   Tired, decreased energy 3 1    Change in  appetite 0 0   Feeling bad or failure about yourself  0 0   Trouble concentrating 0 0   Moving slowly or fidgety/restless 0 0   Suicidal thoughts 0 0   PHQ-9 Score 5 1   Difficult doing work/chores Somewhat difficult Not difficult at all     Interpretation of Total Score  Total Score Depression Severity:  1-4 = Minimal depression,  5-9 = Mild depression, 10-14 = Moderate depression, 15-19 = Moderately severe depression, 20-27 = Severe depression   Psychosocial Evaluation and Intervention:     Psychosocial Evaluation - 05/17/16 0945      Discharge Psychosocial Assessment & Intervention   Comments Counselor follow up with Freda Munro today reporting almost finished with this program.  She had some minor "set backs" in her health with another stent being inserted at [facility] but feels like she has recovered from that and is stronger than before.  She reports sleeping better and her mood is positive.  She continues to have strong supports and plans to exercise consistently following completion of this program.  Counselor commended Margean on her progress made and commitment to improved health through exercise.        Psychosocial Re-Evaluation:     Psychosocial Re-Evaluation    [place] Name 03/10/16 1023 04/28/16 0914 05/12/16 1046         Psychosocial Re-Evaluation   Current issues with  - None Identified Current Stress Concerns     Comments Evin feels that her stress levels are doing good.  She is sleeping well.  Overall, she is feeling good about everything and has no concerns. Adela Lank is feeling good and positive overall.  She realizes that she will not be able to do as much this summer as she did last summer.  Her husband is very supportive and helpful.  She is also sleeping good routinely. Talked to [facility] about her scores being lower for QOL and PHQ-9.  She said that she had not been feeling as well and been having more symptoms.  She was feeling better today overall.  She has a  stress test scheduled for Monday about her SOB and chest pain.  She did have to take a NTG yesterday..  She feels that rehab has helped but she was doing better immediately post surgery versus post stent.     Expected Outcomes  - Continue to have postive outlook Continue to have positive outlooks and hopefully learn about what is going on health wise.     Interventions Encouraged to attend Cardiac Rehabilitation for the exercise;Stress management education Encouraged to attend Cardiac Rehabilitation for the exercise  -        Psychosocial Discharge (Final Psychosocial Re-Evaluation):     Psychosocial Re-Evaluation - 05/12/16 1046      Psychosocial Re-Evaluation   Current issues with Current Stress Concerns   Comments Talked to [facility] about her scores being lower for QOL and PHQ-9.  She said that she had not been feeling as well and been having more symptoms.  She was feeling better today overall.  She has a stress test scheduled for Monday about her SOB and chest pain.  She did have to take a NTG yesterday..  She feels that rehab has helped but she was doing better immediately post surgery versus post stent.   Expected Outcomes Continue to have positive outlooks and hopefully learn about what is going on health wise.      Vocational Rehabilitation: Provide vocational rehab assistance to qualifying candidates.   Vocational Rehab Evaluation & Intervention:     Vocational Rehab - 02/15/16 1041      Initial Vocational Rehab Evaluation & Intervention   Assessment shows need for Vocational Rehabilitation No      Education: Education Goals: Education classes will be provided on a weekly basis, covering required topics. Participant will state understanding/return demonstration of topics presented.  Learning Barriers/Preferences:  Learning Barriers/Preferences - 02/15/16 1041      Learning Barriers/Preferences   Learning Barriers Hearing  wears hearing aides   Learning Preferences  Pictoral;Individual Instruction      Education Topics: General Nutrition Guidelines/Fats and Fiber: -Group instruction provided by verbal, written material, models and posters to present the general guidelines for heart healthy nutrition. Gives an explanation and review of dietary fats and fiber.   Cardiac Rehab from 05/19/2016 in [facility] Cardiac and Pulmonary Rehab  Date  05/17/16  Educator  PI  Instruction Review Code  2- meets goals/outcomes      Controlling Sodium/Reading Food Labels: -Group verbal and written material supporting the discussion of sodium use in heart healthy nutrition. Review and explanation with models, verbal and written materials for utilization of the food label.   Exercise Physiology & Risk Factors: - Group verbal and written instruction with models to review the exercise physiology of the cardiovascular system and associated critical values. Details cardiovascular disease risk factors and the goals associated with each risk factor.   Aerobic Exercise & Resistance Training: - Gives group verbal and written discussion on the health impact of inactivity. On the components of aerobic and resistive training programs and the benefits of this training and how to safely progress through these programs.   Flexibility, Balance, General Exercise Guidelines: - Provides group verbal and written instruction on the benefits of flexibility and balance training programs. Provides general exercise guidelines with specific guidelines to those with heart or lung disease. Demonstration and skill practice provided.   Stress Management: - Provides group verbal and written instruction about the health risks of elevated stress, cause of high stress, and healthy ways to reduce stress.   Cardiac Rehab from 05/19/2016 in [facility] Cardiac and Pulmonary Rehab  Date  04/19/16  Educator  [facility]  Instruction Review Code  2- meets goals/outcomes      Depression: - Provides group verbal and written  instruction on the correlation between heart/lung disease and depressed mood, treatment options, and the stigmas associated with seeking treatment.   Cardiac Rehab from 05/19/2016 in [facility] Cardiac and Pulmonary Rehab  Date  03/24/16  Educator  TS  Instruction Review Code  2- meets goals/outcomes      Anatomy & Physiology of the Heart: - Group verbal and written instruction and models provide basic cardiac anatomy and physiology, with the coronary electrical and arterial systems. Review of: AMI, Angina, Valve disease, Heart Failure, Cardiac Arrhythmia, Pacemakers, and the ICD.   Cardiac Rehab from 05/19/2016 in [facility] Cardiac and Pulmonary Rehab  Date  05/19/16  Educator  [place]  Instruction Review Code  2- meets goals/outcomes      Cardiac Procedures: - Group verbal and written instruction and models to describe the testing methods done to diagnose heart disease. Reviews the outcomes of the test results. Describes the treatment choices: Medical Management, Angioplasty, or Coronary Bypass Surgery.   Cardiac Rehab from 05/19/2016 in [facility] Cardiac and Pulmonary Rehab  Date  04/26/16  Educator  SB  Instruction Review Code  2- meets goals/outcomes      Cardiac Medications: - Group verbal and written instruction to review commonly prescribed medications for heart disease. Reviews the medication, class of the drug, and side effects. Includes the steps to properly store meds and maintain the prescription regimen.   Cardiac Rehab from 05/19/2016 in [facility] Cardiac and Pulmonary Rehab  Date  05/03/16 [03/17/16 Cardiac Meds II]  Educator  SB  Instruction Review Code  2- meets goals/outcomes [Part 1]  Go Sex-Intimacy & Heart Disease, Get SMART - Goal Setting: - Group verbal and written instruction through game format to discuss heart disease and the return to sexual intimacy. Provides group verbal and written material to discuss and apply goal setting through the application of the S.M.A.R.T. Method.    Cardiac Rehab from 05/19/2016 in [facility] Cardiac and Pulmonary Rehab  Date  04/26/16  Educator  SB  Instruction Review Code  2- meets goals/outcomes      Other Matters of the Heart: - Provides group verbal, written materials and models to describe Heart Failure, Angina, Valve Disease, and Diabetes in the realm of heart disease. Includes description of the disease process and treatment options available to the cardiac patient.   Cardiac Rehab from 05/19/2016 in [facility] Cardiac and Pulmonary Rehab  Date  05/19/16  Educator  [place]  Instruction Review Code  2- meets goals/outcomes      Exercise & Equipment Safety: - Individual verbal instruction and demonstration of equipment use and safety with use of the equipment.   Cardiac Rehab from 05/19/2016 in [facility] Cardiac and Pulmonary Rehab  Date  04/21/16  Educator  SB  Instruction Review Code  2- meets goals/outcomes      Infection Prevention: - Provides verbal and written material to individual with discussion of infection control including proper hand washing and proper equipment cleaning during exercise session.   Cardiac Rehab from 05/19/2016 in [facility] Cardiac and Pulmonary Rehab  Date  02/15/16  Educator  C. EnterkinRN  Instruction Review Code  2- meets goals/outcomes      Falls Prevention: - Provides verbal and written material to individual with discussion of falls prevention and safety.   Cardiac Rehab from 05/19/2016 in [facility] Cardiac and Pulmonary Rehab  Date  02/15/16  Educator  C. Lewis  Instruction Review Code  2- meets goals/outcomes      Diabetes: - Individual verbal and written instruction to review signs/symptoms of diabetes, desired ranges of glucose level fasting, after meals and with exercise. Advice that pre and post exercise glucose checks will be done for 3 sessions at entry of program.   Cardiac Rehab from 05/19/2016 in [facility] Cardiac and Pulmonary Rehab  Date  02/15/16       Knowledge Questionnaire Score:     Knowledge  Questionnaire Score - 05/10/16 1141      Knowledge Questionnaire Score   Pre Score 27/28   Post Score 27/28      Core Components/Risk Factors/Patient Goals at Admission:     Personal Goals and Risk Factors at Admission - 02/15/16 1041      Core Components/Risk Factors/Patient Goals on Admission    Weight Management Yes;Weight Maintenance   Intervention Weight Management: Develop a combined nutrition and exercise program designed to reach desired caloric intake, while maintaining appropriate intake of nutrient and fiber, sodium and fats, and appropriate energy expenditure required for the weight goal.;Weight Management: Provide education and appropriate resources to help participant work on and attain dietary goals.   Admit Weight 131 lb 3.2 oz (59.5 kg)   Expected Outcomes Weight Maintenance: Understanding of the daily nutrition guidelines, which includes 25-35% calories from fat, 7% or less cal from saturated fats, less than 24m cholesterol, less than 1.5gm of sodium, & 5 or more servings of fruits and vegetables daily   Sedentary Yes   Intervention Provide advice, education, support and counseling about physical activity/exercise needs.;Develop an individualized exercise prescription for aerobic and resistive training based on initial evaluation findings, risk stratification, comorbidities  and participant's personal goals.   Expected Outcomes Achievement of increased cardiorespiratory fitness and enhanced flexibility, muscular endurance and strength shown through measurements of functional capacity and personal statement of participant.   Increase Strength and Stamina Yes   Intervention Provide advice, education, support and counseling about physical activity/exercise needs.;Develop an individualized exercise prescription for aerobic and resistive training based on initial evaluation findings, risk stratification, comorbidities and participant's personal goals.   Expected Outcomes  Achievement of increased cardiorespiratory fitness and enhanced flexibility, muscular endurance and strength shown through measurements of functional capacity and personal statement of participant.   Hypertension Yes   Intervention Provide education on lifestyle modifcations including regular physical activity/exercise, weight management, moderate sodium restriction and increased consumption of fresh fruit, vegetables, and low fat dairy, alcohol moderation, and smoking cessation.;Monitor prescription use compliance.   Expected Outcomes Short Term: Continued assessment and intervention until BP is < 140/56m HG in hypertensive participants. < 130/83mHG in hypertensive participants with diabetes, heart failure or chronic kidney disease.;Long Term: Maintenance of blood pressure at goal levels.   Lipids Yes   Intervention Provide education and support for participant on nutrition & aerobic/resistive exercise along with prescribed medications to achieve LDL <7012mHDL >59m63m Expected Outcomes Short Term: Participant states understanding of desired cholesterol values and is compliant with medications prescribed. Participant is following exercise prescription and nutrition guidelines.;Long Term: Cholesterol controlled with medications as prescribed, with individualized exercise RX and with personalized nutrition plan. Value goals: LDL < 70mg26mL > 40 mg.      Core Components/Risk Factors/Patient Goals Review:      Goals and Risk Factor Review    Row Name 03/10/16 1018 04/28/16 0918           Core Components/Risk Factors/Patient Goals Review   Personal Goals Review Weight Management/Obesity;Sedentary;Increase Strength and Stamina;Hypertension;Lipids Weight Management/Obesity;Hypertension;Lipids      Review SheilHarukoff to a good start in rehab.  She does not have any major concerns about the program as her incisional pain is improving..  She has already begun to notice a difference and is pleased  with what she has already been able to accomplish in rehab.  She still needs to rest when she gets home, but on Tuesday she was able to also get some house work done!!  She is walking some at home as well.  Blood pressures have been good; no problems with meds. SheliAdela Lankoing well in rehab.  She has been feeling better.  She is still getting tired and thinks that it may be her lisinopril as it has been similar to when she had her surgery. Also her blood pressures have been running low at home and here in rehab.  She has an appointment on 4/24 and is trying to hold out until then to talk with her doctor.  She is not having any other problems with her medications.        Expected Outcomes Short: Continue to come to exercise classes and add in exercise at home.  Long: Continue to work on risk factor modification. Short: Continue to monitor blood pressure to make sure it is not too low.  Long: Continue to work on weight maintenance.         Core Components/Risk Factors/Patient Goals at Discharge (Final Review):      Goals and Risk Factor Review - 04/28/16 0918      Core Components/Risk Factors/Patient Goals Review   Personal Goals Review Weight Management/Obesity;Hypertension;Lipids   Review SheliAdela Lank  is doing well in rehab.  She has been feeling better.  She is still getting tired and thinks that it may be her lisinopril as it has been similar to when she had her surgery. Also her blood pressures have been running low at home and here in rehab.  She has an appointment on 4/24 and is trying to hold out until then to talk with her doctor.  She is not having any other problems with her medications.     Expected Outcomes Short: Continue to monitor blood pressure to make sure it is not too low.  Long: Continue to work on weight maintenance.      ITP Comments:     ITP Comments    Row Name 02/15/16 1043 02/15/16 1237 02/15/16 1242 02/17/16 0627 03/16/16 0608   ITP Comments ITP created during Medical  Review/Orientation appt. Documentation of DX CHL/EPIC Encounter dated 12/30/2015 Chronic Fatigue Syndrome but she crossed out fibromyalgi Appt made for Charmaine with the [employer]. 30 day review. Continue with ITP unless directed changes per Medical Director review. New to program 30 day review. Continue with ITP unless directed changes per Medical Director review   Row Name 04/01/16 1032 04/06/16 1049 04/12/16 1526 04/13/16 0610 05/11/16 0614   ITP Comments Shamiracle reported chest/arm pain on the NS. RN's P. Surles and K. Spencer noted ST depressions on tele which were also present at her prior sessions.  All vitals were within normal limits. Symptoms went away with rest. Symptoms did not reoccur on the TM or seated elliptical (XR).  Biddie was instructed to call her Dr if symptoms return. Lacara was hospitalized for another MI and stent placement. She was discharged on 3/19 and has a follow up on 3/23 with Dr. Ubaldo Glassing.  I told her that we needed clearance to return and will reduced her workloads upon return. We have received Nikita's clearance to return to rehab.  She will be returning on April 3.  Called to let her know. 30 day review. Continue with ITP unless directed changes per Medical Director review.   30 day review. Continue with ITP unless directed changes per Medical Director review      Comments: Discharge ITP

## 2016-06-02 DIAGNOSIS — E7800 Pure hypercholesterolemia, unspecified: Secondary | ICD-10-CM | POA: Diagnosis not present

## 2016-06-02 DIAGNOSIS — I2510 Atherosclerotic heart disease of native coronary artery without angina pectoris: Secondary | ICD-10-CM | POA: Diagnosis not present

## 2016-06-02 DIAGNOSIS — I249 Acute ischemic heart disease, unspecified: Secondary | ICD-10-CM | POA: Diagnosis not present

## 2016-06-02 DIAGNOSIS — R5382 Chronic fatigue, unspecified: Secondary | ICD-10-CM | POA: Diagnosis not present

## 2016-06-02 DIAGNOSIS — I10 Essential (primary) hypertension: Secondary | ICD-10-CM | POA: Diagnosis not present

## 2016-06-08 ENCOUNTER — Other Ambulatory Visit: Payer: Self-pay | Admitting: Internal Medicine

## 2016-06-08 ENCOUNTER — Ambulatory Visit
Admission: RE | Admit: 2016-06-08 | Discharge: 2016-06-08 | Disposition: A | Discharge status: Home / Self Care | Payer: Medicare HMO | Source: Ambulatory Visit | Attending: Internal Medicine | Admitting: Internal Medicine

## 2016-06-08 DIAGNOSIS — Z1231 Encounter for screening mammogram for malignant neoplasm of breast: Secondary | ICD-10-CM

## 2016-08-29 ENCOUNTER — Other Ambulatory Visit: Payer: Self-pay | Admitting: Cardiology

## 2016-09-01 DIAGNOSIS — I10 Essential (primary) hypertension: Secondary | ICD-10-CM | POA: Diagnosis not present

## 2016-09-02 DIAGNOSIS — I10 Essential (primary) hypertension: Secondary | ICD-10-CM | POA: Diagnosis not present

## 2016-09-02 DIAGNOSIS — I249 Acute ischemic heart disease, unspecified: Secondary | ICD-10-CM | POA: Diagnosis not present

## 2016-09-02 DIAGNOSIS — K210 Gastro-esophageal reflux disease with esophagitis: Secondary | ICD-10-CM | POA: Diagnosis not present

## 2016-09-02 DIAGNOSIS — Z951 Presence of aortocoronary bypass graft: Secondary | ICD-10-CM | POA: Diagnosis not present

## 2016-09-02 DIAGNOSIS — I2510 Atherosclerotic heart disease of native coronary artery without angina pectoris: Secondary | ICD-10-CM | POA: Diagnosis not present

## 2016-09-02 DIAGNOSIS — R5382 Chronic fatigue, unspecified: Secondary | ICD-10-CM | POA: Diagnosis not present

## 2016-09-02 DIAGNOSIS — Z86010 Personal history of colonic polyps: Secondary | ICD-10-CM | POA: Diagnosis not present

## 2016-09-02 DIAGNOSIS — E7800 Pure hypercholesterolemia, unspecified: Secondary | ICD-10-CM | POA: Diagnosis not present

## 2016-09-06 DIAGNOSIS — Z86010 Personal history of colonic polyps: Secondary | ICD-10-CM | POA: Insufficient documentation

## 2016-09-08 DIAGNOSIS — R5382 Chronic fatigue, unspecified: Secondary | ICD-10-CM | POA: Diagnosis not present

## 2016-09-08 DIAGNOSIS — I2510 Atherosclerotic heart disease of native coronary artery without angina pectoris: Secondary | ICD-10-CM | POA: Diagnosis not present

## 2016-09-08 DIAGNOSIS — I10 Essential (primary) hypertension: Secondary | ICD-10-CM | POA: Diagnosis not present

## 2016-09-08 DIAGNOSIS — R7301 Impaired fasting glucose: Secondary | ICD-10-CM | POA: Diagnosis not present

## 2016-09-20 ENCOUNTER — Observation Stay
Admission: EM | Admit: 2016-09-20 | Discharge: 2016-09-21 | Disposition: A | Discharge status: Home / Self Care | Payer: Medicare HMO | Attending: Internal Medicine | Admitting: Internal Medicine

## 2016-09-20 ENCOUNTER — Emergency Department: Payer: Medicare HMO

## 2016-09-20 DIAGNOSIS — Z951 Presence of aortocoronary bypass graft: Secondary | ICD-10-CM | POA: Insufficient documentation

## 2016-09-20 DIAGNOSIS — R739 Hyperglycemia, unspecified: Secondary | ICD-10-CM | POA: Insufficient documentation

## 2016-09-20 DIAGNOSIS — R5382 Chronic fatigue, unspecified: Secondary | ICD-10-CM | POA: Diagnosis not present

## 2016-09-20 DIAGNOSIS — R0602 Shortness of breath: Secondary | ICD-10-CM | POA: Insufficient documentation

## 2016-09-20 DIAGNOSIS — I25110 Atherosclerotic heart disease of native coronary artery with unstable angina pectoris: Secondary | ICD-10-CM | POA: Diagnosis not present

## 2016-09-20 DIAGNOSIS — Z888 Allergy status to other drugs, medicaments and biological substances status: Secondary | ICD-10-CM | POA: Insufficient documentation

## 2016-09-20 DIAGNOSIS — Z79899 Other long term (current) drug therapy: Secondary | ICD-10-CM | POA: Insufficient documentation

## 2016-09-20 DIAGNOSIS — I10 Essential (primary) hypertension: Secondary | ICD-10-CM | POA: Insufficient documentation

## 2016-09-20 DIAGNOSIS — Z955 Presence of coronary angioplasty implant and graft: Secondary | ICD-10-CM | POA: Insufficient documentation

## 2016-09-20 DIAGNOSIS — Z9889 Other specified postprocedural states: Secondary | ICD-10-CM | POA: Diagnosis not present

## 2016-09-20 DIAGNOSIS — Z7982 Long term (current) use of aspirin: Secondary | ICD-10-CM | POA: Insufficient documentation

## 2016-09-20 DIAGNOSIS — R079 Chest pain, unspecified: Secondary | ICD-10-CM | POA: Diagnosis not present

## 2016-09-20 DIAGNOSIS — R0789 Other chest pain: Secondary | ICD-10-CM | POA: Diagnosis not present

## 2016-09-20 DIAGNOSIS — M1990 Unspecified osteoarthritis, unspecified site: Secondary | ICD-10-CM | POA: Insufficient documentation

## 2016-09-20 DIAGNOSIS — E785 Hyperlipidemia, unspecified: Secondary | ICD-10-CM | POA: Insufficient documentation

## 2016-09-20 DIAGNOSIS — K219 Gastro-esophageal reflux disease without esophagitis: Secondary | ICD-10-CM | POA: Diagnosis not present

## 2016-09-20 HISTORY — DX: Presence of aortocoronary bypass graft: Z95.1

## 2016-09-20 LAB — BASIC METABOLIC PANEL
ANION GAP: 9 (ref 5–15)
BUN: 14 mg/dL (ref 6–20)
CO2: 23 mmol/L (ref 22–32)
Calcium: 9.6 mg/dL (ref 8.9–10.3)
Chloride: 106 mmol/L (ref 101–111)
Creatinine, Ser: 0.84 mg/dL (ref 0.44–1.00)
GFR calc Af Amer: >60 mL/min (ref >60)
Glucose, Bld: 149 mg/dL — ABNORMAL HIGH (ref 65–99)
POTASSIUM: 4.3 mmol/L (ref 3.5–5.1)
Sodium: 138 mmol/L (ref 135–145)

## 2016-09-20 LAB — CBC
HEMATOCRIT: 36.4 % (ref 35.0–47.0)
HEMOGLOBIN: 12.7 g/dL (ref 12.0–16.0)
MCH: 30.3 pg (ref 26.0–34.0)
MCHC: 34.8 g/dL (ref 32.0–36.0)
MCV: 87.1 fL (ref 80.0–100.0)
Platelets: 206 10*3/uL (ref 150–440)
RBC: 4.18 MIL/uL (ref 3.80–5.20)
RDW: 13.5 % (ref 11.5–14.5)
WBC: 5 10*3/uL (ref 3.6–11.0)

## 2016-09-20 LAB — TROPONIN I: Troponin I: <0.03 ng/mL (ref <0.03)

## 2016-09-20 MED ORDER — ACETAMINOPHEN 650 MG RE SUPP: 650.0000 mg | Freq: Four times a day (QID) | RECTAL | Status: DC | PRN

## 2016-09-20 MED ORDER — POLYETHYLENE GLYCOL 3350 17 G PO PACK: 17.0000 g | PACK | Freq: Every day | ORAL | Status: DC | PRN

## 2016-09-20 MED ORDER — ENOXAPARIN SODIUM 40 MG/0.4ML ~~LOC~~ SOLN
40.0000 mg | SUBCUTANEOUS | Status: DC
  Administered 2016-09-20: 40 mg via SUBCUTANEOUS
  Filled 2016-09-20: qty 0.4

## 2016-09-20 MED ORDER — CYCLOBENZAPRINE HCL 10 MG PO TABS
10.0000 mg | ORAL_TABLET | Freq: Every day | ORAL | Status: DC
  Administered 2016-09-20: 10 mg via ORAL
  Filled 2016-09-20: qty 1

## 2016-09-20 MED ORDER — COENZYME Q10 30 MG PO CAPS
300.0000 mg | ORAL_CAPSULE | Freq: Every day | ORAL | Status: DC
Start: 2016-09-20 — End: 2016-09-20

## 2016-09-20 MED ORDER — COLESTIPOL HCL 1 G PO TABS
2.0000 g | ORAL_TABLET | Freq: Two times a day (BID) | ORAL | Status: DC
  Administered 2016-09-21: 2 g via ORAL
  Filled 2016-09-20 (×3): qty 2

## 2016-09-20 MED ORDER — TICAGRELOR 90 MG PO TABS
90.0000 mg | ORAL_TABLET | Freq: Two times a day (BID) | ORAL | Status: DC
  Administered 2016-09-20 – 2016-09-21 (×2): 90 mg via ORAL
  Filled 2016-09-20 (×2): qty 1

## 2016-09-20 MED ORDER — MAGNESIUM OXIDE 400 (241.3 MG) MG PO TABS
400.0000 mg | ORAL_TABLET | Freq: Two times a day (BID) | ORAL | Status: DC
  Administered 2016-09-20 – 2016-09-21 (×2): 400 mg via ORAL
  Filled 2016-09-20 (×4): qty 1

## 2016-09-20 MED ORDER — ONDANSETRON HCL 4 MG/2ML IJ SOLN: 4.0000 mg | Freq: Four times a day (QID) | INTRAMUSCULAR | Status: DC | PRN

## 2016-09-20 MED ORDER — PANTOPRAZOLE SODIUM 40 MG PO TBEC
40.0000 mg | DELAYED_RELEASE_TABLET | Freq: Every day | ORAL | Status: DC
  Administered 2016-09-21: 40 mg via ORAL
  Filled 2016-09-20: qty 1

## 2016-09-20 MED ORDER — LISINOPRIL 5 MG PO TABS
2.5000 mg | ORAL_TABLET | Freq: Every day | ORAL | Status: DC
  Administered 2016-09-20: 2.5 mg via ORAL
  Filled 2016-09-20: qty 1

## 2016-09-20 MED ORDER — ISOSORBIDE MONONITRATE ER 30 MG PO TB24
30.0000 mg | ORAL_TABLET | Freq: Every day | ORAL | Status: DC
  Administered 2016-09-20: 30 mg via ORAL
  Filled 2016-09-20: qty 1

## 2016-09-20 MED ORDER — NITROGLYCERIN 0.4 MG SL SUBL: 0.4000 mg | SUBLINGUAL_TABLET | SUBLINGUAL | Status: DC | PRN

## 2016-09-20 MED ORDER — ASPIRIN EC 81 MG PO TBEC
243.0000 mg | DELAYED_RELEASE_TABLET | Freq: Once | ORAL | Status: AC
  Administered 2016-09-20: 243 mg via ORAL
  Filled 2016-09-20: qty 3

## 2016-09-20 MED ORDER — VITAMIN D 1000 UNITS PO TABS
5000.0000 [IU] | ORAL_TABLET | Freq: Every day | ORAL | Status: DC
  Administered 2016-09-20 – 2016-09-21 (×2): 5000 [IU] via ORAL
  Filled 2016-09-20 (×2): qty 5

## 2016-09-20 MED ORDER — TICAGRELOR 90 MG PO TABS: 90.0000 mg | ORAL_TABLET | Freq: Two times a day (BID) | ORAL | Status: DC

## 2016-09-20 MED ORDER — SODIUM CHLORIDE 0.9% FLUSH
3.0000 mL | Freq: Two times a day (BID) | INTRAVENOUS | Status: DC
  Administered 2016-09-20 – 2016-09-21 (×2): 3 mL via INTRAVENOUS

## 2016-09-20 MED ORDER — ONDANSETRON HCL 4 MG PO TABS: 4.0000 mg | ORAL_TABLET | Freq: Four times a day (QID) | ORAL | Status: DC | PRN

## 2016-09-20 MED ORDER — METOPROLOL TARTRATE 25 MG PO TABS
25.0000 mg | ORAL_TABLET | Freq: Two times a day (BID) | ORAL | Status: DC
  Administered 2016-09-20 – 2016-09-21 (×2): 25 mg via ORAL
  Filled 2016-09-20 (×2): qty 1

## 2016-09-20 MED ORDER — BACID PO TABS
1.0000 | ORAL_TABLET | Freq: Every day | ORAL | Status: DC
  Filled 2016-09-20 (×2): qty 1

## 2016-09-20 MED ORDER — GEMFIBROZIL 600 MG PO TABS
600.0000 mg | ORAL_TABLET | Freq: Two times a day (BID) | ORAL | Status: DC
  Administered 2016-09-20 – 2016-09-21 (×2): 600 mg via ORAL
  Filled 2016-09-20 (×3): qty 1

## 2016-09-20 MED ORDER — ROSUVASTATIN CALCIUM 10 MG PO TABS: 10.0000 mg | ORAL_TABLET | Freq: Every day | ORAL | Status: DC

## 2016-09-20 MED ORDER — ACETAMINOPHEN 325 MG PO TABS: 650.0000 mg | ORAL_TABLET | Freq: Four times a day (QID) | ORAL | Status: DC | PRN

## 2016-09-20 MED ORDER — ROSUVASTATIN CALCIUM 10 MG PO TABS
10.0000 mg | ORAL_TABLET | Freq: Every day | ORAL | Status: DC
  Administered 2016-09-20: 10 mg via ORAL
  Filled 2016-09-20: qty 1

## 2016-09-20 MED ORDER — ALBUTEROL SULFATE (2.5 MG/3ML) 0.083% IN NEBU: 2.5000 mg | INHALATION_SOLUTION | RESPIRATORY_TRACT | Status: DC | PRN

## 2016-09-20 MED ORDER — ASPIRIN EC 81 MG PO TBEC
81.0000 mg | DELAYED_RELEASE_TABLET | Freq: Every day | ORAL | Status: DC
  Administered 2016-09-21: 81 mg via ORAL
  Filled 2016-09-20 (×2): qty 1

## 2016-09-20 NOTE — ED Notes (Signed)
Patient given ED sandwich tray per request.

## 2016-09-20 NOTE — ED Provider Notes (Signed)
[facility] Emergency Department Provider Note  ___________________________________________   First MD Initiated Contact with Patient 09/20/16 1605     (approximate)  I have reviewed the triage vital signs and the nursing notes.   HISTORY  Chief Complaint Chest Pain   HPI Sheena Rice is a 76 y.o. female with a history of CABG as well as a stent and right RCA who is presenting to the emergency department with chest pressure. Says that her chest pain had started between 1 and 3 PM today and feels like a pressure. She says it initially was an 8 out of 10 and started while she was sitting in her recliner. She also reports shortness of breath it was associated with chest pressure. She denies any radiation of the chest pressure since is to her mid sternum. She says this is a similar pressure that she was feeling when she had her stent placed this past March. She said that she is also exertional pressure was source of breath recently over the past several weeks. She has been compliant with aspirin as well as [name]. Tried 2 nitroglycerin at home as well as Mylanta which brought her pain down from an 8 to a 4 at this time.   Past Medical History:  Diagnosis Date  . Arthritis    OA  . Chronic fatigue   . Coronary artery disease   . GERD (gastroesophageal reflux disease)   . Hypertension   . Unstable angina [facility])     Patient Active Problem List   Diagnosis Date Noted  . Coronary artery disease involving native coronary artery of native heart with unstable angina pectoris ([place])   . Chest pain, rule out acute myocardial infarction 03/31/2016  . S/P CABG x 4 01/04/2016  . Acute coronary syndrome ([place]) 12/30/2015  . Unstable angina ([place]) 12/29/2015    Past Surgical History:  Procedure Laterality Date  . APPENDECTOMY    . CARDIAC CATHETERIZATION Left 12/29/2015   Procedure: Left Heart Cath and Coronary Angiography;  Surgeon: Teodoro Spray, MD;  Location:  [place] [facility];  Service: Cardiovascular;  Laterality: Left;  . CATARACT EXTRACTION    . CHOLECYSTECTOMY    . CORONARY ARTERY BYPASS GRAFT N/A 12/31/2015   Procedure: CORONARY ARTERY BYPASS GRAFTING (CABG), ON PUMP, TIMES FOUR, USING LEFT INTERNAL MAMMARY ARTERY, RIGHT GREATER SAPHENOUS VEIN HARVESTED ENDOSCOPICALLY;  Surgeon: [name], MD;  Location: [place];  Service: Open Heart Surgery;  Laterality: N/A;  -LIMA to LAD -SVG to DIAGONAL -SVG to OM -SVG to PDA  . CORONARY STENT INTERVENTION N/A 04/01/2016   Procedure: Coronary Stent Intervention;  Surgeon: Yolonda Kida, MD;  Location: [place] [facility];  Service: Cardiovascular;  Laterality: N/A;  . CORONARY STENT INTERVENTION N/A 04/02/2016   Procedure: Coronary Stent Intervention;  Surgeon: Burnell Blanks, MD;  Location: [place] [facility];  Service: Cardiovascular;  Laterality: N/A;  . LEFT HEART CATH AND CORS/GRAFTS ANGIOGRAPHY N/A 04/01/2016   Procedure: Left Heart Cath and Cors/Grafts Angiography and possible PCI;  Surgeon: Yolonda Kida, MD;  Location: [place] [facility];  Service: Cardiovascular;  Laterality: N/A;  . TEE WITHOUT CARDIOVERSION N/A 12/31/2015   Procedure: TRANSESOPHAGEAL ECHOCARDIOGRAM (TEE);  Surgeon: [name], MD;  Location: [place];  Service: Open Heart Surgery;  Laterality: N/A;    Prior to Admission medications   Medication Sig Start Date End Date Taking? Authorizing Provider  aspirin EC 81 MG EC tablet Take 1 tablet (  81 mg total) by mouth daily. 12/30/15   Teodoro Spray, MD  Cholecalciferol (VITAMIN D3) 5000 units CAPS Take 5,000 Units by mouth daily.     [provider]  co-enzyme Q-10 30 MG capsule Take 300 mg by mouth daily.    [provider]  colestipol (COLESTID) 1 g tablet Take 2 g by mouth 2 (two) times daily.  03/24/15   [provider]  cyclobenzaprine (FLEXERIL) 10 MG tablet Take 10 mg by mouth at bedtime.  11/17/14   [provider]  esomeprazole (NEXIUM) 20 MG capsule Take 20 mg by mouth every morning.     [provider]  Flaxseed, Linseed, (FLAX SEED OIL PO) Take 1,400 mg by mouth daily.    [provider]  gemfibrozil (LOPID) 600 MG tablet Take 600 mg by mouth 2 (two) times daily.  11/17/14   [provider]  isosorbide mononitrate (IMDUR) 30 MG 24 hr tablet Take 1 tablet (30 mg total) by mouth daily. 04/04/16   Daune Perch, NP  lisinopril (PRINIVIL,ZESTRIL) 2.5 MG tablet Take 1 tablet (2.5 mg total) by mouth daily. 04/04/16   Daune Perch, NP  Magnesium Oxide 400 MG CAPS Take 400 mg by mouth 2 (two) times daily.     [provider]  metoprolol tartrate (LOPRESSOR) 25 MG tablet Take 1 tablet (25 mg total) by mouth 2 (two) times daily. 01/05/16   Barrett, Erin R, PA-C  Probiotic Product (PROBIOTIC DAILY) CAPS Take 1 capsule by mouth daily.    [provider]  rosuvastatin (CRESTOR) 10 MG tablet Take 1 tablet (10 mg total) by mouth daily at 6 PM. 04/04/16   Daune Perch, NP  ticagrelor ([name]) 90 MG TABS tablet Take 1 tablet (90 mg total) by mouth 2 (two) times daily. 04/04/16   Daune Perch, NP    Allergies Pravastatin  Family History  Problem Relation Age of Onset  . Breast cancer Neg Hx     Social History Social History  Substance Use Topics  . Smoking status: Never Smoker  . Smokeless tobacco: Never Used  . Alcohol use No    Review of Systems  Constitutional: No fever/chills Eyes: No visual changes. ENT: No sore throat. Cardiovascular: as above Respiratory: as above Gastrointestinal: No abdominal pain.  No nausea, no vomiting.  No diarrhea.  No constipation. Genitourinary: Negative for dysuria. Musculoskeletal: Negative for back pain. Skin: Negative for rash. Neurological: Negative for headaches, focal weakness or numbness.   ____________________________________________   PHYSICAL EXAM:  VITAL SIGNS: ED Triage Vitals    Enc Vitals Group     BP 09/20/16 1600 (!) 148/58     Pulse Rate 09/20/16 1600 70     Resp 09/20/16 1600 17     Temp 09/20/16 1600 98.1 F (36.7 C)     Temp Source 09/20/16 1600 Oral     SpO2 09/20/16 1600 99 %     Weight 09/20/16 1555 134 lb (60.8 kg)     Height 09/20/16 1555 5\' 4"  (1.626 m)     Head Circumference --      Peak Flow --      Pain Score 09/20/16 1555 8     Pain Loc --      Pain Edu? --      Excl. in [place]? --     Constitutional: Alert and oriented. Well appearing and in no acute distress. Eyes: Conjunctivae are normal.  Head: Atraumatic. Nose: No congestion/rhinnorhea. Mouth/Throat: Mucous membranes are moist.  Neck: No stridor.   Cardiovascular: Normal rate, regular rhythm. Grossly normal heart sounds.  Respiratory: Normal respiratory effort.  No retractions. Lungs CTAB. Gastrointestinal: Soft and nontender. No distention.  Musculoskeletal: No lower extremity tenderness nor edema.  No joint effusions. Neurologic:  Normal speech and language. No gross focal neurologic deficits are appreciated. Skin:  Skin is warm, dry and intact. No rash noted. Psychiatric: Mood and affect are normal. Speech and behavior are normal.  ____________________________________________   LABS (all labs ordered are listed, but only abnormal results are displayed)  Labs Reviewed  BASIC METABOLIC PANEL - Abnormal; Notable for the following:       Result Value   Glucose, Bld 149 (*)    All other components within normal limits  CBC  TROPONIN I   ____________________________________________  EKG  ED ECG REPORT I, Doran Stabler, the attending physician, personally viewed and interpreted this ECG.   Date: 09/20/2016  EKG Time: 1554  Rate: 77  Rhythm: normal sinus rhythm. PVC 2.  Axis: Normal  Intervals:none  ST&T Change: No ST segment elevation or depression. No abnormal T-wave inversion.  ____________________________________________  RADIOLOGY  No active  cardiopulmonary disease. ____________________________________________   PROCEDURES  Procedure(s) performed:   Procedures  Critical Care performed:   ____________________________________________   INITIAL IMPRESSION / ASSESSMENT AND PLAN / ED COURSE  Pertinent labs & imaging results that were available during my care of the patient were reviewed by me and considered in my medical decision making (see chart for details).  ----------------------------------------- 5:47 PM on 09/20/2016 -----------------------------------------  Patient to be admitted to the hospital. Signed out to Dr. Darvin Neighbours. Patient offered but does not want any medication for pain at this time. She was given 3 more aspirin to bring her for a full dose. She is high risk given that she reports having several bypass graft failures and requiring a stent after crate bypass disease just this past March. Initial labs and imaging a reassuring.     ____________________________________________   FINAL CLINICAL IMPRESSION(S) / ED DIAGNOSES  Chest pain.    NEW MEDICATIONS STARTED DURING THIS VISIT:  New Prescriptions   No medications on file     Note:  This document was prepared using Dragon voice recognition software and may include unintentional dictation errors.     Orbie Pyo, MD 09/20/16 [id number]

## 2016-09-20 NOTE — ED Notes (Signed)
Patient left for imaging. 

## 2016-09-20 NOTE — ED Notes (Signed)
Patient states she took one 81mg  Aspirin at 0745 this a.m. Patient states she took one Nitro SL at 1415 and another at 1500 without relief of the pain. Patient c/o headache.

## 2016-09-20 NOTE — H&P (Signed)
[facility] [place] NAME: Sheena Rice    MR#:  [id number]  DATE OF BIRTH:  05-07-1940  DATE OF ADMISSION:  09/20/2016  PRIMARY CARE PHYSICIAN: Madelyn Brunner, MD   REQUESTING/REFERRING PHYSICIAN: Dr. Dineen Kid  CHIEF COMPLAINT:   Chief Complaint  Patient presents with  . Chest Pain    HISTORY OF PRESENT ILLNESS:  Sheena Rice  is a 76 y.o. female with a known history of CAD status post CABG, complicated With drug-eluting stent placed in distal RCA in March 2018, hypertension, chronic angina who follows with Dr. Ubaldo Glassing presents to the emergency room with worsening chest pain today on exertion. Patient has had exertional chest pain, shortness of breath and fatigue since her last cardiac catheterization. Had stress test in April 2018 which was normal per patient. Her symptoms have continued to worsen making her come to the emergency room today. Her EKG is unchanged. Troponin is normal. Patient is presently chest pain-free and is being admitted to the hospitalist service for further monitoring and treatment.  PAST MEDICAL HISTORY:   Past Medical History:  Diagnosis Date  . Arthritis    OA  . Chronic fatigue   . Coronary artery disease   . GERD (gastroesophageal reflux disease)   . Hx of CABG   . Hypertension   . Unstable angina ([place])     PAST SURGICAL HISTORY:   Past Surgical History:  Procedure Laterality Date  . APPENDECTOMY    . CARDIAC CATHETERIZATION Left 12/29/2015   Procedure: Left Heart Cath and Coronary Angiography;  Surgeon: Teodoro Spray, MD;  Location: [place] [facility];  Service: Cardiovascular;  Laterality: Left;  . CATARACT EXTRACTION    . CHOLECYSTECTOMY    . CORONARY ARTERY BYPASS GRAFT N/A 12/31/2015   Procedure: CORONARY ARTERY BYPASS GRAFTING (CABG), ON PUMP, TIMES FOUR, USING LEFT INTERNAL MAMMARY ARTERY, RIGHT GREATER SAPHENOUS VEIN HARVESTED ENDOSCOPICALLY;  Surgeon: [name], MD;  Location: [place];  Service: Open Heart Surgery;  Laterality: N/A;  -LIMA to LAD -SVG to DIAGONAL -SVG to OM -SVG to PDA  . CORONARY STENT INTERVENTION N/A 04/01/2016   Procedure: Coronary Stent Intervention;  Surgeon: Yolonda Kida, MD;  Location: [place] [facility];  Service: Cardiovascular;  Laterality: N/A;  . CORONARY STENT INTERVENTION N/A 04/02/2016   Procedure: Coronary Stent Intervention;  Surgeon: Burnell Blanks, MD;  Location: [place] [facility];  Service: Cardiovascular;  Laterality: N/A;  . LEFT HEART CATH AND CORS/GRAFTS ANGIOGRAPHY N/A 04/01/2016   Procedure: Left Heart Cath and Cors/Grafts Angiography and possible PCI;  Surgeon: Yolonda Kida, MD;  Location: [place] [facility];  Service: Cardiovascular;  Laterality: N/A;  . TEE WITHOUT CARDIOVERSION N/A 12/31/2015   Procedure: TRANSESOPHAGEAL ECHOCARDIOGRAM (TEE);  Surgeon: [name], MD;  Location: [place];  Service: Open Heart Surgery;  Laterality: N/A;    SOCIAL HISTORY:   Social History  Substance Use Topics  . Smoking status: Never Smoker  . Smokeless tobacco: Never Used  . Alcohol use No    FAMILY HISTORY:   Family History  Problem Relation Age of Onset  . Breast cancer Neg Hx     DRUG ALLERGIES:   Allergies  Allergen Reactions  . Pravastatin Other (See Comments)    Muscle pain,"STATIN" Drugs    REVIEW OF SYSTEMS:   Review of Systems  Constitutional: Positive for malaise/fatigue. Negative for chills and fever.  HENT: Negative for sore throat.  Eyes: Negative for blurred vision, double vision and pain.  Respiratory: Positive for shortness of breath. Negative for cough, hemoptysis and wheezing.   Cardiovascular: Positive for chest pain. Negative for palpitations, orthopnea and leg swelling.  Gastrointestinal: Negative for abdominal pain, constipation, diarrhea, heartburn, nausea and vomiting.  Genitourinary: Negative for dysuria and hematuria.  Musculoskeletal: Negative for back pain and  joint pain.  Skin: Negative for rash.  Neurological: Positive for weakness. Negative for sensory change, speech change, focal weakness and headaches.  Endo/Heme/Allergies: Does not bruise/bleed easily.  Psychiatric/Behavioral: Negative for depression. The patient is not nervous/anxious.     MEDICATIONS AT HOME:   Prior to Admission medications   Medication Sig Start Date End Date Taking? Authorizing Provider  aspirin EC 81 MG EC tablet Take 1 tablet (81 mg total) by mouth daily. 12/30/15   Teodoro Spray, MD  Cholecalciferol (VITAMIN D3) 5000 units CAPS Take 5,000 Units by mouth daily.     [provider]  co-enzyme Q-10 30 MG capsule Take 300 mg by mouth daily.    [provider]  colestipol (COLESTID) 1 g tablet Take 2 g by mouth 2 (two) times daily.  03/24/15   [provider]  cyclobenzaprine (FLEXERIL) 10 MG tablet Take 10 mg by mouth at bedtime.  11/17/14   [provider]  esomeprazole (NEXIUM) 20 MG capsule Take 20 mg by mouth every morning.     [provider]  Flaxseed, Linseed, (FLAX SEED OIL PO) Take 1,400 mg by mouth daily.    [provider]  gemfibrozil (LOPID) 600 MG tablet Take 600 mg by mouth 2 (two) times daily.  11/17/14   [provider]  isosorbide mononitrate (IMDUR) 30 MG 24 hr tablet Take 1 tablet (30 mg total) by mouth daily. 04/04/16   Daune Perch, NP  lisinopril (PRINIVIL,ZESTRIL) 2.5 MG tablet Take 1 tablet (2.5 mg total) by mouth daily. 04/04/16   Daune Perch, NP  Magnesium Oxide 400 MG CAPS Take 400 mg by mouth 2 (two) times daily.     [provider]  metoprolol tartrate (LOPRESSOR) 25 MG tablet Take 1 tablet (25 mg total) by mouth 2 (two) times daily. 01/05/16   Barrett, Erin R, PA-C  Probiotic Product (PROBIOTIC DAILY) CAPS Take 1 capsule by mouth daily.    [provider]  rosuvastatin (CRESTOR) 10 MG tablet Take 1 tablet (10 mg total) by mouth daily at 6 PM. 04/04/16    Daune Perch, NP  ticagrelor ([name]) 90 MG TABS tablet Take 1 tablet (90 mg total) by mouth 2 (two) times daily. 04/04/16   Daune Perch, NP     VITAL SIGNS:  Blood pressure (!) 162/78, pulse 65, temperature 98.1 F (36.7 C), temperature source Oral, resp. rate 12, height 5\' 4"  (1.626 m), weight 60.8 kg (134 lb), SpO2 99 %.  PHYSICAL EXAMINATION:  Physical Exam  GENERAL:  76 y.o.-year-old patient lying in the bed with no acute distress.  EYES: Pupils equal, round, reactive to light and accommodation. No scleral icterus. Extraocular muscles intact.  HEENT: Head atraumatic, normocephalic. Oropharynx and nasopharynx clear. No oropharyngeal erythema, moist oral mucosa  NECK:  Supple, no jugular venous distention. No thyroid enlargement, no tenderness.  LUNGS: Normal breath sounds bilaterally, no wheezing, rales, rhonchi. No use of accessory muscles of respiration.  CARDIOVASCULAR: S1, S2 normal. No murmurs, rubs, or gallops.  ABDOMEN: Soft, nontender, nondistended. Bowel sounds present. No organomegaly or mass.  EXTREMITIES: No pedal edema, cyanosis, or clubbing. + 2  pedal & radial pulses b/l.   NEUROLOGIC: Cranial nerves II through XII are intact. No focal Motor or sensory deficits appreciated b/l PSYCHIATRIC: The patient is alert and oriented x 3. Good affect.  SKIN: No obvious rash, lesion, or ulcer.   LABORATORY PANEL:   CBC  Recent Labs Lab 09/20/16 1559  WBC 5.0  HGB 12.7  HCT 36.4  PLT 206   ------------------------------------------------------------------------------------------------------------------  Chemistries   Recent Labs Lab 09/20/16 1559  NA 138  K 4.3  CL 106  CO2 23  GLUCOSE 149*  BUN 14  CREATININE 0.84  CALCIUM 9.6   ------------------------------------------------------------------------------------------------------------------  Cardiac Enzymes  Recent Labs Lab 09/20/16 1559  TROPONINI <0.03    ------------------------------------------------------------------------------------------------------------------  RADIOLOGY:  Dg Chest 2 View  Result Date: 09/20/2016 CLINICAL DATA:  Central chest pain today. EXAM: CHEST  2 VIEW COMPARISON:  03/31/2016 FINDINGS: Sequelae of prior CABG are again identified. The cardiac silhouette is upper limits of normal in size. The lungs are well inflated without evidence of airspace consolidation, edema, pleural effusion, or pneumothorax. No acute osseous abnormality is seen. IMPRESSION: No active cardiopulmonary disease. Electronically Signed   By: Logan Bores M.D.   On: 09/20/2016 16:35     IMPRESSION AND PLAN:   * Chest pain. Patient does have chronic angina. Seems to have progressive worsening of symptoms over the past few weeks. We'll admit patient under observation. Rule out acute MI. Consult [facility] cardiology. Telemetry monitoring. We'll keep her nothing by mouth after midnight in case patient had stress test or cardiac catheterization tomorrow a.m.  * hyperglycemia. We'll check hemoglobin A1c.  * hypertension. Continue home medications.  * hyperlipidemia. Continue medications.  All the records are reviewed and case discussed with ED provider. Management plans discussed with the patient, family and they are in agreement.  CODE STATUS: FULL CODE  TOTAL TIME TAKING CARE OF THIS PATIENT: 40 minutes.   Hillary Bow R M.D on 09/20/2016 at 5:37 PM  Between 7am to 6pm - Pager - [phone]  After 6pm go to [email or web address] - password [email or web address]  Office  [phone]  CC: Primary care physician; Madelyn Brunner, MD  Note: This dictation was prepared with Dragon dictation along with smaller phrase technology. Any transcriptional errors that result from this process are unintentional.

## 2016-09-20 NOTE — ED Triage Notes (Signed)
Pt c/o chest pain with SOB and nausea since 10am, states she took nitro and ASA without any relief.the patient had cardiac bypass and stent placement last December.

## 2016-09-21 ENCOUNTER — Other Ambulatory Visit: Payer: Self-pay

## 2016-09-21 ENCOUNTER — Other Ambulatory Visit: Payer: Self-pay | Admitting: Radiology

## 2016-09-21 DIAGNOSIS — R079 Chest pain, unspecified: Secondary | ICD-10-CM | POA: Diagnosis not present

## 2016-09-21 DIAGNOSIS — I25790 Atherosclerosis of other coronary artery bypass graft(s) with unstable angina pectoris: Secondary | ICD-10-CM | POA: Diagnosis not present

## 2016-09-21 DIAGNOSIS — E785 Hyperlipidemia, unspecified: Secondary | ICD-10-CM | POA: Diagnosis not present

## 2016-09-21 DIAGNOSIS — I10 Essential (primary) hypertension: Secondary | ICD-10-CM | POA: Diagnosis not present

## 2016-09-21 LAB — TROPONIN I

## 2016-09-21 LAB — HEMOGLOBIN A1C
HEMOGLOBIN A1C: 6.4 % — AB (ref 4.8–5.6)
MEAN PLASMA GLUCOSE: 136.98 mg/dL

## 2016-09-21 MED ORDER — NITROGLYCERIN 0.4 MG SL SUBL: 0.4000 mg | SUBLINGUAL_TABLET | SUBLINGUAL | 0 refills | Status: AC | PRN

## 2016-09-21 MED ORDER — LISINOPRIL 2.5 MG PO TABS: 2.5000 mg | ORAL_TABLET | Freq: Every day | ORAL | 0 refills | Status: DC

## 2016-09-21 MED ORDER — RANOLAZINE ER 500 MG PO TB12: 500.0000 mg | ORAL_TABLET | Freq: Two times a day (BID) | ORAL | 0 refills | Status: DC

## 2016-09-21 MED ORDER — METOPROLOL TARTRATE 25 MG PO TABS: 25.0000 mg | ORAL_TABLET | Freq: Two times a day (BID) | ORAL | 0 refills | Status: AC

## 2016-09-21 MED ORDER — RANOLAZINE ER 500 MG PO TB12
500.0000 mg | ORAL_TABLET | Freq: Two times a day (BID) | ORAL | Status: DC
  Administered 2016-09-21: 500 mg via ORAL
  Filled 2016-09-21 (×2): qty 1

## 2016-09-21 NOTE — Progress Notes (Signed)
Talked to Dr. Benjie Karvonen about patient and family asking if we need to discontinue Imdur and Lisinopril since we are starting her with Ranexa, order to not give the dose this morning and will possibly discharge patient today. No other concern at the moment. RN will continue to monitor.

## 2016-09-21 NOTE — Plan of Care (Signed)
Problem: Pain Managment: Goal: General experience of comfort will improve Outcome: Progressing Slight complaints of mid sternal chest pressure especially with activity, but pt states it is much better than it has been, pt did not want anything for pain at the time.   Problem: Tissue Perfusion: Goal: Risk factors for ineffective tissue perfusion will decrease Outcome: Completed/Met Date Met: 09/21/16 lovenox for VTE

## 2016-09-21 NOTE — Discharge Summary (Signed)
[facility] [place] NAME: Sheena Rice    MR#:  [id number]  DATE OF BIRTH:  [birth year]  DATE OF ADMISSION:  09/20/2016 ADMITTING PHYSICIAN: Hillary Bow, MD  DATE OF DISCHARGE: 09/21/2016  PRIMARY CARE PHYSICIAN: Madelyn Brunner, MD    ADMISSION DIAGNOSIS:  Chest pain, unspecified type [R07.9]  DISCHARGE DIAGNOSIS:  Active Problems:   Chest pain   SECONDARY DIAGNOSIS:   Past Medical History:  Diagnosis Date  . Arthritis    OA  . Chronic fatigue   . Coronary artery disease   . GERD (gastroesophageal reflux disease)   . Hx of CABG   . Hypertension   . Unstable angina [facility])     HOSPITAL COURSE:   76 year old female with known coronary artery disease status post carotid bypass graft with multiple occlusions of the grafts and coronary arteries and some stenting of distal right coronary artery with a patent LIMA to the LAD presents with chest pain  1. Chest pain: Patient has ruled out for acute coronary syndrome with negative troponins. Patient was evaluated by cardiology. Patient had recent stress test in April which was negative. Medical management was recommended by cardiology. She has been started on Ranexa. She is intolerant isosorbide   2. CAD/CABG: Continue aspirin, metoprolol, Crestor, [name]  3. Hyperlipidemia: Continue statin and gemfibrozil  4. Essential hypertension: Continue lisinopril, metoprolol   DISCHARGE CONDITIONS AND DIET:   Stable for discharge on heart healthy diet  CONSULTS OBTAINED:  Treatment Team:  Corey Skains, MD  DRUG ALLERGIES:   Allergies  Allergen Reactions  . Pravastatin Other (See Comments)    Muscle pain,"STATIN" Drugs    DISCHARGE MEDICATIONS:   Current Discharge Medication List    START taking these medications   Details  nitroGLYCERIN (NITROSTAT) 0.4 MG SL tablet Place 1 tablet (0.4 mg total) under the tongue every 5 (five) minutes as needed for chest  pain. Qty: 30 tablet, Refills: 0    ranolazine (RANEXA) 500 MG 12 hr tablet Take 1 tablet (500 mg total) by mouth 2 (two) times daily. Qty: 60 tablet, Refills: 0      CONTINUE these medications which have CHANGED   Details  metoprolol tartrate (LOPRESSOR) 25 MG tablet Take 1 tablet (25 mg total) by mouth 2 (two) times daily. Qty: 60 tablet, Refills: 0      CONTINUE these medications which have NOT CHANGED   Details  aspirin EC 81 MG EC tablet Take 1 tablet (81 mg total) by mouth daily. Qty: 30 tablet, Refills: 0    Cholecalciferol (VITAMIN D3) 5000 units CAPS Take 5,000 Units by mouth daily.     cyclobenzaprine (FLEXERIL) 10 MG tablet Take 10 mg by mouth at bedtime.     esomeprazole (NEXIUM) 20 MG capsule Take 20 mg by mouth every morning.     Flaxseed, Linseed, (FLAX SEED OIL PO) Take 1,400 mg by mouth daily.    gemfibrozil (LOPID) 600 MG tablet Take 600 mg by mouth 2 (two) times daily.     Magnesium Oxide 400 MG CAPS Take 400 mg by mouth 2 (two) times daily.     rosuvastatin (CRESTOR) 10 MG tablet Take 1 tablet (10 mg total) by mouth daily at 6 PM. Qty: 30 tablet, Refills: 5    ticagrelor ([name]) 90 MG TABS tablet Take 1 tablet (90 mg total) by mouth 2 (two) times daily. Qty: 60 tablet, Refills: 5    lisinopril (PRINIVIL,ZESTRIL) 2.5 MG tablet Take  1 tablet (2.5 mg total) by mouth daily. Qty: 30 tablet, Refills: 5      STOP taking these medications     isosorbide mononitrate (IMDUR) 30 MG 24 hr tablet      colestipol (COLESTID) 1 g tablet      Probiotic Product (PROBIOTIC DAILY) CAPS           Today   CHIEF COMPLAINT:  Patient doing okay this morning. Denies chest pain at this time. Had 9 hours of chest pain.  VITAL SIGNS:  Blood pressure 121/67, pulse 60, temperature 97.8 F (36.6 C), temperature source Oral, resp. rate 18, height 5\' 4"  (1.626 m), weight 64.4 kg (142 lb), SpO2 97 %.   REVIEW OF SYSTEMS:  Review of Systems  Constitutional:  Negative.  Negative for chills, fever and malaise/fatigue.  HENT: Negative.  Negative for ear discharge, ear pain, hearing loss, nosebleeds and sore throat.   Eyes: Negative.  Negative for blurred vision and pain.  Respiratory: Negative.  Negative for cough, hemoptysis, shortness of breath and wheezing.   Cardiovascular: Negative.  Negative for chest pain, palpitations and leg swelling.  Gastrointestinal: Negative.  Negative for abdominal pain, blood in stool, diarrhea, nausea and vomiting.  Genitourinary: Negative.  Negative for dysuria.  Musculoskeletal: Negative.  Negative for back pain.  Skin: Negative.   Neurological: Negative for dizziness, tremors, speech change, focal weakness, seizures and headaches.  Endo/Heme/Allergies: Negative.  Does not bruise/bleed easily.  Psychiatric/Behavioral: Negative.  Negative for depression, hallucinations and suicidal ideas.     PHYSICAL EXAMINATION:  GENERAL:  76 y.o.-year-old patient lying in the bed with no acute distress.  NECK:  Supple, no jugular venous distention. No thyroid enlargement, no tenderness.  LUNGS: Normal breath sounds bilaterally, no wheezing, rales,rhonchi  No use of accessory muscles of respiration.  CARDIOVASCULAR: S1, S2 normal. No murmurs, rubs, or gallops.  ABDOMEN: Soft, non-tender, non-distended. Bowel sounds present. No organomegaly or mass.  EXTREMITIES: No pedal edema, cyanosis, or clubbing.  PSYCHIATRIC: The patient is alert and oriented x 3.  SKIN: No obvious rash, lesion, or ulcer.   DATA REVIEW:   CBC  Recent Labs Lab 09/20/16 1559  WBC 5.0  HGB 12.7  HCT 36.4  PLT 206    Chemistries   Recent Labs Lab 09/20/16 1559  NA 138  K 4.3  CL 106  CO2 23  GLUCOSE 149*  BUN 14  CREATININE 0.84  CALCIUM 9.6    Cardiac Enzymes  Recent Labs Lab 09/20/16 1559 09/20/16 2102 09/21/16 0310  TROPONINI <0.03 <0.03 <0.03    Microbiology Results  @MICRORSLT48 @  RADIOLOGY:  Dg Chest 2  View  Result Date: 09/20/2016 CLINICAL DATA:  Central chest pain today. EXAM: CHEST  2 VIEW COMPARISON:  03/31/2016 FINDINGS: Sequelae of prior CABG are again identified. The cardiac silhouette is upper limits of normal in size. The lungs are well inflated without evidence of airspace consolidation, edema, pleural effusion, or pneumothorax. No acute osseous abnormality is seen. IMPRESSION: No active cardiopulmonary disease. Electronically Signed   By: Logan Bores M.D.   On: 09/20/2016 16:35      Current Discharge Medication List    START taking these medications   Details  nitroGLYCERIN (NITROSTAT) 0.4 MG SL tablet Place 1 tablet (0.4 mg total) under the tongue every 5 (five) minutes as needed for chest pain. Qty: 30 tablet, Refills: 0    ranolazine (RANEXA) 500 MG 12 hr tablet Take 1 tablet (500 mg total) by mouth 2 (two) times daily.  Qty: 60 tablet, Refills: 0      CONTINUE these medications which have CHANGED   Details  metoprolol tartrate (LOPRESSOR) 25 MG tablet Take 1 tablet (25 mg total) by mouth 2 (two) times daily. Qty: 60 tablet, Refills: 0      CONTINUE these medications which have NOT CHANGED   Details  aspirin EC 81 MG EC tablet Take 1 tablet (81 mg total) by mouth daily. Qty: 30 tablet, Refills: 0    Cholecalciferol (VITAMIN D3) 5000 units CAPS Take 5,000 Units by mouth daily.     cyclobenzaprine (FLEXERIL) 10 MG tablet Take 10 mg by mouth at bedtime.     esomeprazole (NEXIUM) 20 MG capsule Take 20 mg by mouth every morning.     Flaxseed, Linseed, (FLAX SEED OIL PO) Take 1,400 mg by mouth daily.    gemfibrozil (LOPID) 600 MG tablet Take 600 mg by mouth 2 (two) times daily.     Magnesium Oxide 400 MG CAPS Take 400 mg by mouth 2 (two) times daily.     rosuvastatin (CRESTOR) 10 MG tablet Take 1 tablet (10 mg total) by mouth daily at 6 PM. Qty: 30 tablet, Refills: 5    ticagrelor ([name]) 90 MG TABS tablet Take 1 tablet (90 mg total) by mouth 2 (two) times  daily. Qty: 60 tablet, Refills: 5    lisinopril (PRINIVIL,ZESTRIL) 2.5 MG tablet Take 1 tablet (2.5 mg total) by mouth daily. Qty: 30 tablet, Refills: 5      STOP taking these medications     isosorbide mononitrate (IMDUR) 30 MG 24 hr tablet      colestipol (COLESTID) 1 g tablet      Probiotic Product (PROBIOTIC DAILY) CAPS            Management plans discussed with the patient and she is in agreement. Stable for discharge home  Patient should follow up with cardiology  CODE STATUS:     Code Status Orders        Start     Ordered   09/20/16 1733  Full code  Continuous     09/20/16 1734    Code Status History    Date Active Date Inactive Code Status Order ID Comments User Context   04/01/2016 10:19 PM 04/04/2016  7:54 PM Full Code [id number]  Doylene Canning, MD Inpatient   03/31/2016 10:53 PM 04/01/2016  9:48 PM Full Code [id number]  Grayling, Ubaldo Glassing, DO Inpatient   12/30/2015  1:23 PM 01/05/2016  3:14 PM Full Code [id number]  Elgie Collard, PA-C Inpatient   12/29/2015  5:53 PM 12/30/2015  9:45 AM Full Code [id number]  Teodoro Spray, MD Inpatient   12/29/2015  4:28 PM 12/29/2015  5:53 PM Full Code [id number]  Teodoro Spray, MD Inpatient    Advance Directive Documentation     Most Recent Value  Type of Advance Directive  Healthcare Power of Attorney, Living will [still on process to be set up]  Pre-existing out of facility DNR order (yellow form or pink MOST form)  -  "MOST" Form in Place?  -      TOTAL TIME TAKING CARE OF THIS PATIENT: 38 minutes.    Note: This dictation was prepared with Dragon dictation along with smaller phrase technology. Any transcriptional errors that result from this process are unintentional.  Eliana Lueth M.D on 09/21/2016 at 8:55 AM  Between 7am to 6pm - Pager - [phone] After 6pm go to [email or web address] - password [email or web address]  [employer]  Office  [phone]  CC: Primary care physician; Madelyn Brunner,  MD

## 2016-09-21 NOTE — Progress Notes (Signed)
Went over discharge instruction and follow up appointment with the patient and family. Discontinue peripheral IV and telemetry monitor. Optometrist for transport.

## 2016-09-21 NOTE — Consult Note (Signed)
[facility] Consultation Note  Patient ID: Sheena Rice, MRN: [id number], DOB/AGE: [birth year] 76 y.o. Admit date: 09/20/2016   Date of Consult: 09/21/2016 Primary Physician: Madelyn Brunner, MD Primary Cardiologist:Fath  Chief Complaint:  Chief Complaint  Patient presents with  . Chest Pain   Reason for Consult: chest pain  HPI: 76 y.o. female with known coronary artery disease status post coronary artery bypass graft to the in December 2017 and subsequent occlusion of graft to right coronary artery obtuse marginal and diagonal with patent LIMA to the LAD. The patient subsequently had a PCI and stent placement of distal right coronary artery to posterior lateral with continued significant stenosis of the PDA. Since then over the last 3 months she has been fatigued on appropriate medication management including high intensity cholesterol therapy hypertension medication management. The patient was placed on isosorbide for which the patient had significant symptoms of weakness and fatigue which was adjusted. She has had new onset of evidence of left sternal chest discomfort of unknown etiology lasting for hours at a time and not changing any character depending on her activity level. She had slow improvements of symptoms and currently has full relief. She has not had any evidence of myocardial infarction with a normal troponin and her EKG is unchanged. There is been no heart failure type symptoms. Based on previous cardiac catheterization and there appears not to be any primary ability for further intervention at this time which would be necessarily helpful  Past Medical History:  Diagnosis Date  . Arthritis    OA  . Chronic fatigue   . Coronary artery disease   . GERD (gastroesophageal reflux disease)   . Hx of CABG   . Hypertension   . Unstable angina [facility])       Surgical History:  Past Surgical History:  Procedure Laterality Date  . APPENDECTOMY    . CARDIAC  CATHETERIZATION Left 12/29/2015   Procedure: Left Heart Cath and Coronary Angiography;  Surgeon: Teodoro Spray, MD;  Location: [place] [facility];  Service: Cardiovascular;  Laterality: Left;  . CATARACT EXTRACTION    . CHOLECYSTECTOMY    . CORONARY ARTERY BYPASS GRAFT N/A 12/31/2015   Procedure: CORONARY ARTERY BYPASS GRAFTING (CABG), ON PUMP, TIMES FOUR, USING LEFT INTERNAL MAMMARY ARTERY, RIGHT GREATER SAPHENOUS VEIN HARVESTED ENDOSCOPICALLY;  Surgeon: [name], MD;  Location: [place];  Service: Open Heart Surgery;  Laterality: N/A;  -LIMA to LAD -SVG to DIAGONAL -SVG to OM -SVG to PDA  . CORONARY STENT INTERVENTION N/A 04/01/2016   Procedure: Coronary Stent Intervention;  Surgeon: Yolonda Kida, MD;  Location: [place] [facility];  Service: Cardiovascular;  Laterality: N/A;  . CORONARY STENT INTERVENTION N/A 04/02/2016   Procedure: Coronary Stent Intervention;  Surgeon: Burnell Blanks, MD;  Location: [place] [facility];  Service: Cardiovascular;  Laterality: N/A;  . LEFT HEART CATH AND CORS/GRAFTS ANGIOGRAPHY N/A 04/01/2016   Procedure: Left Heart Cath and Cors/Grafts Angiography and possible PCI;  Surgeon: Yolonda Kida, MD;  Location: [place] [facility];  Service: Cardiovascular;  Laterality: N/A;  . TEE WITHOUT CARDIOVERSION N/A 12/31/2015   Procedure: TRANSESOPHAGEAL ECHOCARDIOGRAM (TEE);  Surgeon: [name], MD;  Location: [place];  Service: Open Heart Surgery;  Laterality: N/A;     Home Meds: Prior to Admission medications   Medication Sig Start Date End Date Taking? Authorizing Provider  aspirin EC 81 MG EC tablet Take 1 tablet (81 mg total) by  mouth daily. 12/30/15  Yes Teodoro Spray, MD  Cholecalciferol (VITAMIN D3) 5000 units CAPS Take 5,000 Units by mouth daily.    Yes [provider]  cyclobenzaprine (FLEXERIL) 10 MG tablet Take 10 mg by mouth at bedtime.  11/17/14  Yes [provider]  esomeprazole (NEXIUM) 20 MG  capsule Take 20 mg by mouth every morning.    Yes [provider]  Flaxseed, Linseed, (FLAX SEED OIL PO) Take 1,400 mg by mouth daily.   Yes [provider]  gemfibrozil (LOPID) 600 MG tablet Take 600 mg by mouth 2 (two) times daily.  11/17/14  Yes [provider]  Magnesium Oxide 400 MG CAPS Take 400 mg by mouth 2 (two) times daily.    Yes [provider]  metoprolol tartrate (LOPRESSOR) 25 MG tablet Take 1 tablet (25 mg total) by mouth 2 (two) times daily. Patient taking differently: Take 12.5 mg by mouth 2 (two) times daily.  01/05/16  Yes Barrett, Erin R, PA-C  rosuvastatin (CRESTOR) 10 MG tablet Take 1 tablet (10 mg total) by mouth daily at 6 PM. 04/04/16  Yes Daune Perch, NP  ticagrelor ([name]) 90 MG TABS tablet Take 1 tablet (90 mg total) by mouth 2 (two) times daily. 04/04/16  Yes Daune Perch, NP  isosorbide mononitrate (IMDUR) 30 MG 24 hr tablet Take 1 tablet (30 mg total) by mouth daily. Patient not taking: Reported on 09/20/2016 04/04/16   Daune Perch, NP  lisinopril (PRINIVIL,ZESTRIL) 2.5 MG tablet Take 1 tablet (2.5 mg total) by mouth daily. Patient not taking: Reported on 09/20/2016 04/04/16   Daune Perch, NP    Inpatient Medications:  . aspirin EC  81 mg Oral Daily  . cholecalciferol  5,000 Units Oral Daily  . colestipol  2 g Oral BID  . cyclobenzaprine  10 mg Oral QHS  . enoxaparin (LOVENOX) injection  40 mg Subcutaneous Q24H  . gemfibrozil  600 mg Oral BID  . isosorbide mononitrate  30 mg Oral Daily  . lactobacillus acidophilus  1 tablet Oral Daily  . lisinopril  2.5 mg Oral Daily  . magnesium oxide  400 mg Oral BID  . metoprolol tartrate  25 mg Oral BID  . pantoprazole  40 mg Oral Daily  . rosuvastatin  10 mg Oral q1800  . sodium chloride flush  3 mL Intravenous Q12H  . ticagrelor  90 mg Oral BID     Allergies:  Allergies  Allergen Reactions  . Pravastatin Other (See Comments)    Muscle pain,"STATIN" Drugs     Social History   Social History  . Marital status: Married    Spouse name: N/A  . Number of children: N/A  . Years of education: N/A   Occupational History  . Not on file.   Social History Main Topics  . Smoking status: Never Smoker  . Smokeless tobacco: Never Used  . Alcohol use No  . Drug use: No  . Sexual activity: Not on file   Other Topics Concern  . Not on file   Social History Narrative  . No narrative on file     Family History  Problem Relation Age of Onset  . Breast cancer Neg Hx      Review of Systems Positive forChest pain Negative for: General:  chills, fever, night sweats or weight changes.  Cardiovascular: PND orthopnea syncope dizziness  Dermatological skin lesions rashes Respiratory: Cough congestion Urologic: Frequent urination urination at night and hematuria Abdominal: negative for nausea, vomiting, diarrhea,  bright red blood per rectum, melena, or hematemesis Neurologic: negative for visual changes, and/or hearing changes  All other systems reviewed and are otherwise negative except as noted above.  Labs:  Recent Labs  09/20/16 1559 09/20/16 2102 09/21/16 0310  TROPONINI <0.03 <0.03 <0.03   Lab Results  Component Value Date   WBC 5.0 09/20/2016   HGB 12.7 09/20/2016   HCT 36.4 09/20/2016   MCV 87.1 09/20/2016   PLT 206 09/20/2016    Recent Labs Lab 09/20/16 1559  NA 138  K 4.3  CL 106  CO2 23  BUN 14  CREATININE 0.84  CALCIUM 9.6  GLUCOSE 149*   Lab Results  Component Value Date   CHOL 199 04/02/2016   HDL 35 (L) 04/02/2016   LDLCALC 139 (H) 04/02/2016   TRIG 127 04/02/2016   No results found for: DDIMER  Radiology/Studies:  Dg Chest 2 View  Result Date: 09/20/2016 CLINICAL DATA:  Central chest pain today. EXAM: CHEST  2 VIEW COMPARISON:  03/31/2016 FINDINGS: Sequelae of prior CABG are again identified. The cardiac silhouette is upper limits of normal in size. The lungs are well inflated without evidence of  airspace consolidation, edema, pleural effusion, or pneumothorax. No acute osseous abnormality is seen. IMPRESSION: No active cardiopulmonary disease. Electronically Signed   By: Logan Bores M.D.   On: 09/20/2016 16:35    EXH:BZJIRC sinus rhythm  Weights: Filed Weights   09/20/16 1555 09/21/16 0401  Weight: 60.8 kg (134 lb) 64.4 kg (142 lb)     Physical Exam: Blood pressure 121/67, pulse 60, temperature 97.8 F (36.6 C), temperature source Oral, resp. rate 18, height 5\' 4"  (1.626 m), weight 64.4 kg (142 lb), SpO2 97 %. Body mass index is 24.37 kg/m. General: Well developed, well nourished, in no acute distress. Head eyes ears nose throat: Normocephalic, atraumatic, sclera non-icteric, no xanthomas, nares are without discharge. No apparent thyromegaly and/or mass  Lungs: Normal respiratory effort.  no wheezes, no rales, no rhonchi.  Heart: RRR with normal S1 S2. no murmur gallop, no rub, PMI is normal size and placement, carotid upstroke normal without bruit, jugular venous pressure is normal Abdomen: Soft, non-tender, non-distended with normoactive bowel sounds. No hepatomegaly. No rebound/guarding. No obvious abdominal masses. Abdominal aorta is normal size without bruit Extremities: No edema. no cyanosis, no clubbing, no ulcers  Peripheral : 2+ bilateral upper extremity pulses, 2+ bilateral femoral pulses, 2+ bilateral dorsal pedal pulse Neuro: Alert and oriented. No facial asymmetry. No focal deficit. Moves all extremities spontaneously. Musculoskeletal: Normal muscle tone without kyphosis Psych:  Responds to questions appropriately with a normal affect.    Assessment: 76 year old female with known coronary artery disease status post carotid bypass graft with multiple occlusions of the grafts and coronary arteries and some stenting of distal right coronary artery with a patent LIMA to the LAD needing further medical management without current evidence of myocardial  infarction  Plan: And. Continue supportive care for chest pain including nitrates 2. Isosorbide for chest discomfort 3. Continue ACE inhibitor beta blocker for hypertension control and anginal symptoms 4. High intensity cholesterol therapy with Crestor and increased dose to maximum dose if able 5. Add Ranexa 500 mg twice per day for treatment of possible chest discomfort 6. Ambulate and follow for improvements of symptoms and possible discharged home with further follow-up in adjustments as an outpatient  Signed, Corey Skains M.D. [facility] 09/21/2016, 8:44 AM

## 2016-09-21 NOTE — Care Management (Addendum)
Spoke with attending and anticipate patient will discharge home today.  Spoke with patient's daughter Eustaquio Maize and she is in agreement. Daughter verbalizes some concerns regarding medication reconciliation as medications that have been discontinued previously are still on active medication list.  Nursing staff will work with attending to ensure meds are reconciled correctly at discharge. Discussed that patient was taken off Imdur in January 2018. There was also discussion about patient not being able to bring her own medications from home to take under observation status rather than being billed for them.  CM discussed this is a [employer].  Reviewed the information on the observation notice regarding filing these meds with patient medicare part d plan.  At present, there are not discharge needs identified by members of the care team.

## 2016-09-21 NOTE — Care Management Obs Status (Signed)
[facility] NOTIFICATION   Patient Details  Name: Sheena Rice MRN: [id number] Date of Birth: 1940-08-20   Medicare Observation Status Notification Given:  No  < 24 hours  Katrina Stack, RN 09/21/2016, 8:54 AM

## 2016-09-21 NOTE — Progress Notes (Signed)
[place] [facility] [place] NAME: Sheena Rice    MR#:  [id number]  DATE OF BIRTH:  10-29-1940  SUBJECTIVE:   Patient had 9 hours Of constant chest pressure. She denies chest pain, shortness of breath currently.  REVIEW OF SYSTEMS:    Review of Systems  Constitutional: Negative for fever, chills weight loss HENT: Negative for ear pain, nosebleeds, congestion, facial swelling, rhinorrhea, neck pain, neck stiffness and ear discharge.   Respiratory: Negative for cough, shortness of breath, wheezing  Cardiovascular: Negative for chest pain, palpitations and leg swelling.  Gastrointestinal: Negative for heartburn, abdominal pain, vomiting, diarrhea or consitpation Genitourinary: Negative for dysuria, urgency, frequency, hematuria Musculoskeletal: Negative for back pain or joint pain Neurological: Negative for dizziness, seizures, syncope, focal weakness,  numbness and headaches.  Hematological: Does not bruise/bleed easily.  Psychiatric/Behavioral: Negative for hallucinations, confusion, dysphoric mood    Tolerating Diet:npo      DRUG ALLERGIES:   Allergies  Allergen Reactions  . Pravastatin Other (See Comments)    Muscle pain,"STATIN" Drugs    VITALS:  Blood pressure (!) 108/52, pulse (!) 54, temperature 97.8 F (36.6 C), temperature source Oral, resp. rate 18, height 5\' 4"  (1.626 m), weight 64.4 kg (142 lb), SpO2 96 %.  PHYSICAL EXAMINATION:  Constitutional: Appears well-developed and well-nourished. No distress. HENT: Normocephalic. Marland Kitchen Oropharynx is clear and moist.  Eyes: Conjunctivae and EOM are normal. PERRLA, no scleral icterus.  Neck: Normal ROM. Neck supple. No JVD. No tracheal deviation. CVS: RRR, S1/S2 +, no murmurs, no gallops, no carotid bruit.  Pulmonary: Effort and breath sounds normal, no stridor, rhonchi, wheezes, rales.  Abdominal: Soft. BS +,  no distension, tenderness, rebound or guarding.  Musculoskeletal: Normal  range of motion. No edema and no tenderness.  Neuro: Alert. CN 2-12 grossly intact. No focal deficits. Skin: Skin is warm and dry. No rash noted. Psychiatric: Normal mood and affect.      LABORATORY PANEL:   CBC  Recent Labs Lab 09/20/16 1559  WBC 5.0  HGB 12.7  HCT 36.4  PLT 206   ------------------------------------------------------------------------------------------------------------------  Chemistries   Recent Labs Lab 09/20/16 1559  NA 138  K 4.3  CL 106  CO2 23  GLUCOSE 149*  BUN 14  CREATININE 0.84  CALCIUM 9.6   ------------------------------------------------------------------------------------------------------------------  Cardiac Enzymes  Recent Labs Lab 09/20/16 1559 09/20/16 2102 09/21/16 0310  TROPONINI <0.03 <0.03 <0.03   ------------------------------------------------------------------------------------------------------------------  RADIOLOGY:  Dg Chest 2 View  Result Date: 09/20/2016 CLINICAL DATA:  Central chest pain today. EXAM: CHEST  2 VIEW COMPARISON:  03/31/2016 FINDINGS: Sequelae of prior CABG are again identified. The cardiac silhouette is upper limits of normal in size. The lungs are well inflated without evidence of airspace consolidation, edema, pleural effusion, or pneumothorax. No acute osseous abnormality is seen. IMPRESSION: No active cardiopulmonary disease. Electronically Signed   By: Logan Bores M.D.   On: 09/20/2016 16:35     ASSESSMENT AND PLAN:   76 year old female with history of CAD/CABG with drug-eluting stent placed in distal RCA in March 2018 who presents with chest pain.  1. Chest pain: Patient has ruled out for acute coronary syndrome with negative troponins. Discussed case with cardiology this morning Plan is to optimize medication and evaluate further chest pain. She had recent stress test in April 2018.  2. CAD/CABG: Continue aspirin, metoprolol, Crestor [name]  3. Hyperlipidemia: Continue statin  and gemfibrozil  4. Essential hypertension: Continue isosorbide, lisinopril, metoprolol   Management plans  discussed with the patient and daughter and they are in agreement.  CODE STATUS: full  TOTAL TIME TAKING CARE OF THIS PATIENT: 30 minutes.     POSSIBLE D/C tomorrow, DEPENDING ON CLINICAL CONDITION.   Willine Schwalbe M.D on 09/21/2016 at 8:12 AM  Between 7am to 6pm - Pager - [phone] After 6pm go to [email or web address] - password [email or web address] [facility]  Office  [phone]  CC: Primary care physician; Madelyn Brunner, MD  Note: This dictation was prepared with Dragon dictation along with smaller phrase technology. Any transcriptional errors that result from this process are unintentional.

## 2016-09-29 DIAGNOSIS — I2510 Atherosclerotic heart disease of native coronary artery without angina pectoris: Secondary | ICD-10-CM | POA: Diagnosis not present

## 2016-09-29 DIAGNOSIS — R079 Chest pain, unspecified: Secondary | ICD-10-CM | POA: Diagnosis not present

## 2016-09-29 DIAGNOSIS — I10 Essential (primary) hypertension: Secondary | ICD-10-CM | POA: Diagnosis not present

## 2016-09-30 DIAGNOSIS — R319 Hematuria, unspecified: Secondary | ICD-10-CM | POA: Diagnosis not present

## 2016-09-30 DIAGNOSIS — I2510 Atherosclerotic heart disease of native coronary artery without angina pectoris: Secondary | ICD-10-CM | POA: Diagnosis not present

## 2016-09-30 DIAGNOSIS — I249 Acute ischemic heart disease, unspecified: Secondary | ICD-10-CM | POA: Diagnosis not present

## 2016-09-30 DIAGNOSIS — I10 Essential (primary) hypertension: Secondary | ICD-10-CM | POA: Diagnosis not present

## 2016-09-30 DIAGNOSIS — E7800 Pure hypercholesterolemia, unspecified: Secondary | ICD-10-CM | POA: Diagnosis not present

## 2016-10-20 DIAGNOSIS — H903 Sensorineural hearing loss, bilateral: Secondary | ICD-10-CM | POA: Diagnosis not present

## 2016-10-20 DIAGNOSIS — H6123 Impacted cerumen, bilateral: Secondary | ICD-10-CM | POA: Diagnosis not present

## 2016-11-29 DIAGNOSIS — L905 Scar conditions and fibrosis of skin: Secondary | ICD-10-CM | POA: Diagnosis not present

## 2016-11-29 DIAGNOSIS — D229 Melanocytic nevi, unspecified: Secondary | ICD-10-CM | POA: Diagnosis not present

## 2016-11-29 DIAGNOSIS — Z1283 Encounter for screening for malignant neoplasm of skin: Secondary | ICD-10-CM | POA: Diagnosis not present

## 2016-11-29 DIAGNOSIS — L821 Other seborrheic keratosis: Secondary | ICD-10-CM | POA: Diagnosis not present

## 2016-11-29 DIAGNOSIS — I788 Other diseases of capillaries: Secondary | ICD-10-CM | POA: Diagnosis not present

## 2016-11-29 DIAGNOSIS — R7301 Impaired fasting glucose: Secondary | ICD-10-CM | POA: Diagnosis not present

## 2016-11-29 DIAGNOSIS — D1800 Hemangioma unspecified site: Secondary | ICD-10-CM | POA: Diagnosis not present

## 2016-12-06 DIAGNOSIS — Z23 Encounter for immunization: Secondary | ICD-10-CM | POA: Diagnosis not present

## 2016-12-06 DIAGNOSIS — E7800 Pure hypercholesterolemia, unspecified: Secondary | ICD-10-CM | POA: Diagnosis not present

## 2016-12-06 DIAGNOSIS — I2510 Atherosclerotic heart disease of native coronary artery without angina pectoris: Secondary | ICD-10-CM | POA: Diagnosis not present

## 2016-12-06 DIAGNOSIS — Z5181 Encounter for therapeutic drug level monitoring: Secondary | ICD-10-CM | POA: Diagnosis not present

## 2016-12-06 DIAGNOSIS — K210 Gastro-esophageal reflux disease with esophagitis: Secondary | ICD-10-CM | POA: Diagnosis not present

## 2016-12-06 DIAGNOSIS — J069 Acute upper respiratory infection, unspecified: Secondary | ICD-10-CM | POA: Diagnosis not present

## 2016-12-06 DIAGNOSIS — I10 Essential (primary) hypertension: Secondary | ICD-10-CM | POA: Diagnosis not present

## 2016-12-30 DIAGNOSIS — E7800 Pure hypercholesterolemia, unspecified: Secondary | ICD-10-CM | POA: Diagnosis not present

## 2016-12-30 DIAGNOSIS — I2510 Atherosclerotic heart disease of native coronary artery without angina pectoris: Secondary | ICD-10-CM | POA: Diagnosis not present

## 2016-12-30 DIAGNOSIS — I10 Essential (primary) hypertension: Secondary | ICD-10-CM | POA: Diagnosis not present

## 2016-12-30 DIAGNOSIS — I249 Acute ischemic heart disease, unspecified: Secondary | ICD-10-CM | POA: Diagnosis not present

## 2017-02-16 DIAGNOSIS — H524 Presbyopia: Secondary | ICD-10-CM | POA: Diagnosis not present

## 2017-02-28 DIAGNOSIS — E7800 Pure hypercholesterolemia, unspecified: Secondary | ICD-10-CM | POA: Diagnosis not present

## 2017-02-28 DIAGNOSIS — Z5181 Encounter for therapeutic drug level monitoring: Secondary | ICD-10-CM | POA: Diagnosis not present

## 2017-03-07 DIAGNOSIS — Z0000 Encounter for general adult medical examination without abnormal findings: Secondary | ICD-10-CM | POA: Diagnosis not present

## 2017-03-07 DIAGNOSIS — I10 Essential (primary) hypertension: Secondary | ICD-10-CM | POA: Diagnosis not present

## 2017-03-07 DIAGNOSIS — I2510 Atherosclerotic heart disease of native coronary artery without angina pectoris: Secondary | ICD-10-CM | POA: Diagnosis not present

## 2017-03-07 DIAGNOSIS — K210 Gastro-esophageal reflux disease with esophagitis: Secondary | ICD-10-CM | POA: Diagnosis not present

## 2017-03-07 DIAGNOSIS — E7800 Pure hypercholesterolemia, unspecified: Secondary | ICD-10-CM | POA: Diagnosis not present

## 2017-03-21 DIAGNOSIS — Z86010 Personal history of colonic polyps: Secondary | ICD-10-CM | POA: Diagnosis not present

## 2017-03-21 DIAGNOSIS — I2510 Atherosclerotic heart disease of native coronary artery without angina pectoris: Secondary | ICD-10-CM | POA: Diagnosis not present

## 2017-03-21 DIAGNOSIS — Z951 Presence of aortocoronary bypass graft: Secondary | ICD-10-CM | POA: Diagnosis not present

## 2017-04-28 ENCOUNTER — Ambulatory Visit: Payer: Medicare HMO | Admitting: Anesthesiology

## 2017-04-28 ENCOUNTER — Encounter: Payer: Self-pay | Admitting: *Deleted

## 2017-04-28 ENCOUNTER — Ambulatory Visit
Admission: RE | Admit: 2017-04-28 | Discharge: 2017-04-28 | Disposition: A | Discharge status: Home / Self Care | Payer: Medicare HMO | Source: Ambulatory Visit | Attending: Unknown Physician Specialty | Admitting: Unknown Physician Specialty

## 2017-04-28 ENCOUNTER — Encounter
Admission: RE | Disposition: A | Discharge status: Home / Self Care | Payer: Self-pay | Source: Ambulatory Visit | Attending: Unknown Physician Specialty

## 2017-04-28 DIAGNOSIS — Z888 Allergy status to other drugs, medicaments and biological substances status: Secondary | ICD-10-CM | POA: Diagnosis not present

## 2017-04-28 DIAGNOSIS — Z955 Presence of coronary angioplasty implant and graft: Secondary | ICD-10-CM | POA: Insufficient documentation

## 2017-04-28 DIAGNOSIS — Z7982 Long term (current) use of aspirin: Secondary | ICD-10-CM | POA: Diagnosis not present

## 2017-04-28 DIAGNOSIS — Z79899 Other long term (current) drug therapy: Secondary | ICD-10-CM | POA: Insufficient documentation

## 2017-04-28 DIAGNOSIS — M810 Age-related osteoporosis without current pathological fracture: Secondary | ICD-10-CM | POA: Insufficient documentation

## 2017-04-28 DIAGNOSIS — K219 Gastro-esophageal reflux disease without esophagitis: Secondary | ICD-10-CM | POA: Insufficient documentation

## 2017-04-28 DIAGNOSIS — I2510 Atherosclerotic heart disease of native coronary artery without angina pectoris: Secondary | ICD-10-CM | POA: Insufficient documentation

## 2017-04-28 DIAGNOSIS — I10 Essential (primary) hypertension: Secondary | ICD-10-CM | POA: Diagnosis not present

## 2017-04-28 DIAGNOSIS — K635 Polyp of colon: Secondary | ICD-10-CM | POA: Diagnosis not present

## 2017-04-28 DIAGNOSIS — K640 First degree hemorrhoids: Secondary | ICD-10-CM | POA: Diagnosis not present

## 2017-04-28 DIAGNOSIS — Z7902 Long term (current) use of antithrombotics/antiplatelets: Secondary | ICD-10-CM | POA: Insufficient documentation

## 2017-04-28 DIAGNOSIS — Z951 Presence of aortocoronary bypass graft: Secondary | ICD-10-CM | POA: Insufficient documentation

## 2017-04-28 DIAGNOSIS — K648 Other hemorrhoids: Secondary | ICD-10-CM | POA: Diagnosis not present

## 2017-04-28 DIAGNOSIS — E785 Hyperlipidemia, unspecified: Secondary | ICD-10-CM | POA: Insufficient documentation

## 2017-04-28 DIAGNOSIS — M1990 Unspecified osteoarthritis, unspecified site: Secondary | ICD-10-CM | POA: Insufficient documentation

## 2017-04-28 DIAGNOSIS — Z1211 Encounter for screening for malignant neoplasm of colon: Secondary | ICD-10-CM | POA: Insufficient documentation

## 2017-04-28 DIAGNOSIS — Z86010 Personal history of colonic polyps: Secondary | ICD-10-CM | POA: Insufficient documentation

## 2017-04-28 HISTORY — DX: Hyperlipidemia, unspecified: E78.5

## 2017-04-28 HISTORY — PX: COLONOSCOPY WITH PROPOFOL: SHX5780

## 2017-04-28 HISTORY — DX: Personal history of colonic polyps: Z86.010

## 2017-04-28 HISTORY — DX: Age-related osteoporosis without current pathological fracture: M81.0

## 2017-04-28 SURGERY — COLONOSCOPY WITH PROPOFOL
Anesthesia: General

## 2017-04-28 MED ORDER — PHENYLEPHRINE HCL 10 MG/ML IJ SOLN
INTRAMUSCULAR | Status: AC
  Filled 2017-04-28: qty 1

## 2017-04-28 MED ORDER — FENTANYL CITRATE (PF) 100 MCG/2ML IJ SOLN
INTRAMUSCULAR | Status: DC | PRN
  Administered 2017-04-28: 50 ug via INTRAVENOUS

## 2017-04-28 MED ORDER — LIDOCAINE HCL (PF) 2 % IJ SOLN
INTRAMUSCULAR | Status: AC
Start: 2017-04-28 — End: 2017-04-28
  Filled 2017-04-28: qty 10

## 2017-04-28 MED ORDER — SODIUM CHLORIDE 0.9 % IV SOLN: INTRAVENOUS | Status: DC

## 2017-04-28 MED ORDER — FENTANYL CITRATE (PF) 100 MCG/2ML IJ SOLN
INTRAMUSCULAR | Status: AC
  Filled 2017-04-28: qty 2

## 2017-04-28 MED ORDER — GLYCOPYRROLATE 0.2 MG/ML IJ SOLN
INTRAMUSCULAR | Status: DC | PRN
  Administered 2017-04-28: 0.2 mg via INTRAVENOUS

## 2017-04-28 MED ORDER — EPHEDRINE SULFATE 50 MG/ML IJ SOLN
INTRAMUSCULAR | Status: AC
  Filled 2017-04-28: qty 1

## 2017-04-28 MED ORDER — LIDOCAINE HCL (PF) 2 % IJ SOLN
INTRAMUSCULAR | Status: DC | PRN
  Administered 2017-04-28: 60 mg

## 2017-04-28 MED ORDER — SODIUM CHLORIDE 0.9 % IV SOLN
INTRAVENOUS | Status: DC
  Administered 2017-04-28: 1000 mL via INTRAVENOUS

## 2017-04-28 MED ORDER — PROPOFOL 500 MG/50ML IV EMUL
INTRAVENOUS | Status: DC | PRN
  Administered 2017-04-28: 50 ug/kg/min via INTRAVENOUS

## 2017-04-28 MED ORDER — MIDAZOLAM HCL 2 MG/2ML IJ SOLN
INTRAMUSCULAR | Status: AC
  Filled 2017-04-28: qty 2

## 2017-04-28 MED ORDER — PROPOFOL 10 MG/ML IV BOLUS
INTRAVENOUS | Status: DC | PRN
  Administered 2017-04-28 (×2): 20 mg via INTRAVENOUS

## 2017-04-28 MED ORDER — PROPOFOL 500 MG/50ML IV EMUL
INTRAVENOUS | Status: AC
  Filled 2017-04-28: qty 50

## 2017-04-28 MED ORDER — GLYCOPYRROLATE 0.2 MG/ML IJ SOLN
INTRAMUSCULAR | Status: AC
  Filled 2017-04-28: qty 1

## 2017-04-28 MED ORDER — MIDAZOLAM HCL 5 MG/5ML IJ SOLN
INTRAMUSCULAR | Status: DC | PRN
  Administered 2017-04-28: 1 mg via INTRAVENOUS

## 2017-04-28 NOTE — Anesthesia Postprocedure Evaluation (Signed)
Anesthesia Post Note  Patient: Sheena Rice  Procedure(s) Performed: COLONOSCOPY WITH PROPOFOL (N/A )  Patient location during evaluation: PACU Anesthesia Type: General Level of consciousness: awake and alert Pain management: pain level controlled Vital Signs Assessment: post-procedure vital signs reviewed and stable Respiratory status: spontaneous breathing, nonlabored ventilation, respiratory function stable and patient connected to nasal cannula oxygen Cardiovascular status: blood pressure returned to baseline and stable Postop Assessment: no apparent nausea or vomiting Anesthetic complications: no     Last Vitals:  Vitals:   04/28/17 0713 04/28/17 0759  BP: (!) 187/76 (!) 96/48  Pulse: 60   Resp: 18   Temp: (!) 36.1 C (!) 36.1 C  SpO2: 100%     Last Pain:  Vitals:   04/28/17 0759  TempSrc: Tympanic  PainSc:                  Precious Haws Billi Bright

## 2017-04-28 NOTE — Op Note (Addendum)
[facility] Gastroenterology Patient Name: Sheena Rice Procedure Date: 04/28/2017 7:11 AM MRN: [id number] Account #: [id number] Date of Birth: Feb 18, 1940 Admit Type: Outpatient Age: 77 Room: [facility] ENDO ROOM 1 Gender: Female Note Status: Supervisor Override Procedure:            Colonoscopy Indications:          High risk colon cancer surveillance: Personal history                        of colonic polyps Providers:            Manya Silvas, MD Referring MD:         Baxter Hire, MD (Referring MD) Medicines:            Propofol per Anesthesia Complications:        No immediate complications. Procedure:            Pre-Anesthesia Assessment:                       - After reviewing the risks and benefits, the patient                        was deemed in satisfactory condition to undergo the                        procedure.                       After obtaining informed consent, the colonoscope was                        passed under direct vision. Throughout the procedure,                        the patient's blood pressure, pulse, and oxygen                        saturations were monitored continuously. The                        Colonoscope was introduced through the anus and                        advanced to the the cecum, identified by appendiceal                        orifice and ileocecal valve. The colonoscopy was                        performed without difficulty. The patient tolerated the                        procedure well. The quality of the bowel preparation                        was excellent. Findings:      Internal hemorrhoids were found during endoscopy. The hemorrhoids were       medium-sized and Grade I (internal hemorrhoids that do not prolapse).      The entire examined colon appeared normal. Impression:           -  Internal hemorrhoids.                       - The entire examined colon is normal.                       - No  specimens collected. Recommendation:       - Repeat colonoscopy in 5 years for screening purposes.                        If in good health. Manya Silvas, MD 04/28/2017 7:59:55 AM This report has been signed electronically. Number of Addenda: 0 Note Initiated On: 04/28/2017 7:11 AM Scope Withdrawal Time: 0 hours 7 minutes 18 seconds  Total Procedure Duration: 0 hours 16 minutes 20 seconds       [facility]

## 2017-04-28 NOTE — Anesthesia Preprocedure Evaluation (Signed)
Anesthesia Evaluation  Patient identified by MRN, date of birth, ID band Patient awake    Reviewed: Allergy & Precautions, H&P , NPO status , Patient's Chart, lab work & pertinent test results  Airway Mallampati: III  TM Distance: <3 FB Neck ROM: full    Dental  (+) Chipped, Implants   Pulmonary neg pulmonary ROS, neg shortness of breath,           Cardiovascular Exercise Tolerance: Good hypertension, (-) angina+ CAD and + Cardiac Stents  negative cardio ROS       Neuro/Psych negative neurological ROS  negative psych ROS   GI/Hepatic Neg liver ROS, GERD  Medicated and Controlled,  Endo/Other  negative endocrine ROS  Renal/GU negative Renal ROS  negative genitourinary   Musculoskeletal  (+) Arthritis ,   Abdominal   Peds  Hematology negative hematology ROS (+)   Anesthesia Other Findings Past Medical History: No date: Arthritis     Comment:  OA No date: Chronic fatigue No date: Coronary artery disease No date: GERD (gastroesophageal reflux disease) No date: Hx of adenomatous colonic polyps No date: Hx of CABG No date: Hyperlipidemia No date: Hypertension No date: Osteoporosis No date: Unstable angina ([place])  Past Surgical History: No date: APPENDECTOMY 12/29/2015: CARDIAC CATHETERIZATION; Left     Comment:  Procedure: Left Heart Cath and Coronary Angiography;                Surgeon: Teodoro Spray, MD;  Location: [place] [facility]               LAB;  Service: Cardiovascular;  Laterality: Left; No date: CATARACT EXTRACTION No date: CHOLECYSTECTOMY 09/04/1998, 11/18/1993, 02/17/2003, 04/25/2006, 08/01/2011: COLONOSCOPY 12/31/2015: CORONARY ARTERY BYPASS GRAFT; N/A     Comment:  Procedure: CORONARY ARTERY BYPASS GRAFTING (CABG), ON               PUMP, TIMES FOUR, USING LEFT INTERNAL MAMMARY ARTERY,               RIGHT GREATER SAPHENOUS VEIN HARVESTED ENDOSCOPICALLY;                Surgeon: [name], MD;  Location: [place];                Service: Open Heart Surgery;  Laterality: N/A;  -LIMA to               LAD -SVG to DIAGONAL -SVG to OM -SVG to PDA 04/01/2016: CORONARY STENT INTERVENTION; N/A     Comment:  Procedure: Coronary Stent Intervention;  Surgeon: Yolonda Kida, MD;  Location: [place] [facility];                Service: Cardiovascular;  Laterality: N/A; 04/02/2016: CORONARY STENT INTERVENTION; N/A     Comment:  Procedure: Coronary Stent Intervention;  Surgeon:               Burnell Blanks, MD;  Location: [place] [facility]               LAB;  Service: Cardiovascular;  Laterality: N/A; No date: ESOPHAGOGASTRODUODENOSCOPY 04/01/2016: LEFT HEART CATH AND CORS/GRAFTS ANGIOGRAPHY; N/A     Comment:  Procedure: Left Heart Cath and Cors/Grafts Angiography               and possible PCI;  Surgeon: Yolonda Kida, MD;  Location: [place] [facility] LAB;  Service: Cardiovascular;              Laterality: N/A; 12/31/2015: TEE WITHOUT CARDIOVERSION; N/A     Comment:  Procedure: TRANSESOPHAGEAL ECHOCARDIOGRAM (TEE);                Surgeon: [name], MD;  Location: [place];                Service: Open Heart Surgery;  Laterality: N/A;  BMI    Body Mass Index:  23.66 kg/m      Reproductive/Obstetrics negative OB ROS                             Anesthesia Physical Anesthesia Plan  ASA: III  Anesthesia Plan: General   Post-op Pain Management:    Induction: Intravenous  PONV Risk Score and Plan: Propofol infusion and TIVA  Airway Management Planned: Natural Airway and Nasal Cannula  Additional Equipment:   Intra-op Plan:   Post-operative Plan:   Informed Consent: I have reviewed the patients History and Physical, chart, labs and discussed the procedure including the risks, benefits and alternatives for the proposed anesthesia with the patient or authorized representative who has indicated  his/her understanding and acceptance.   Dental Advisory Given  Plan Discussed with: Anesthesiologist, CRNA and Surgeon  Anesthesia Plan Comments: (Patient consented for risks of anesthesia including but not limited to:  - adverse reactions to medications - risk of intubation if required - damage to teeth, lips or other oral mucosa - sore throat or hoarseness - Damage to heart, brain, lungs or loss of life  Patient voiced understanding.)        Anesthesia Quick Evaluation

## 2017-04-28 NOTE — Transfer of Care (Signed)
Immediate Anesthesia Transfer of Care Note  Patient: Sheena Rice  Procedure(s) Performed: COLONOSCOPY WITH PROPOFOL (N/A )  Patient Location: PACU  Anesthesia Type:General  Level of Consciousness: sedated  Airway & Oxygen Therapy: Patient Spontanous Breathing and Patient connected to nasal cannula oxygen  Post-op Assessment: Report given to RN and Post -op Vital signs reviewed and stable  Post vital signs: Reviewed and stable  Last Vitals:  Vitals Value Taken Time  BP 96/48 04/28/2017  8:00 AM  Temp 36.1 C 04/28/2017  7:59 AM  Pulse 56 04/28/2017  8:01 AM  Resp 12 04/28/2017  8:01 AM  SpO2 99 % 04/28/2017  8:01 AM  Vitals shown include unvalidated device data.  Last Pain:  Vitals:   04/28/17 0759  TempSrc: Tympanic  PainSc:          Complications: No apparent anesthesia complications

## 2017-04-28 NOTE — H&P (Signed)
Primary Care Physician:  Baxter Hire, MD Primary Gastroenterologist:  Dr. Vira Agar  Pre-Procedure History & Physical: HPI:  Sheena Rice is a 77 y.o. female is here for an colonoscopy.  Done for personal history of colon polyps.   Past Medical History:  Diagnosis Date  . Arthritis    OA  . Chronic fatigue   . Coronary artery disease   . GERD (gastroesophageal reflux disease)   . Hx of adenomatous colonic polyps   . Hx of CABG   . Hyperlipidemia   . Hypertension   . Osteoporosis   . Unstable angina [facility])     Past Surgical History:  Procedure Laterality Date  . APPENDECTOMY    . CARDIAC CATHETERIZATION Left 12/29/2015   Procedure: Left Heart Cath and Coronary Angiography;  Surgeon: Teodoro Spray, MD;  Location: [place] [facility] LAB;  Service: Cardiovascular;  Laterality: Left;  . CATARACT EXTRACTION    . CHOLECYSTECTOMY    . COLONOSCOPY  09/04/1998, 11/18/1993, 02/17/2003, 04/25/2006, 08/01/2011  . CORONARY ARTERY BYPASS GRAFT N/A 12/31/2015   Procedure: CORONARY ARTERY BYPASS GRAFTING (CABG), ON PUMP, TIMES FOUR, USING LEFT INTERNAL MAMMARY ARTERY, RIGHT GREATER SAPHENOUS VEIN HARVESTED ENDOSCOPICALLY;  Surgeon: [name], MD;  Location: [place];  Service: Open Heart Surgery;  Laterality: N/A;  -LIMA to LAD -SVG to DIAGONAL -SVG to OM -SVG to PDA  . CORONARY STENT INTERVENTION N/A 04/01/2016   Procedure: Coronary Stent Intervention;  Surgeon: Yolonda Kida, MD;  Location: [place] [facility];  Service: Cardiovascular;  Laterality: N/A;  . CORONARY STENT INTERVENTION N/A 04/02/2016   Procedure: Coronary Stent Intervention;  Surgeon: Burnell Blanks, MD;  Location: [place] [facility] LAB;  Service: Cardiovascular;  Laterality: N/A;  . ESOPHAGOGASTRODUODENOSCOPY    . LEFT HEART CATH AND CORS/GRAFTS ANGIOGRAPHY N/A 04/01/2016   Procedure: Left Heart Cath and Cors/Grafts Angiography and possible PCI;  Surgeon: Yolonda Kida, MD;  Location: [place] [facility]  LAB;  Service: Cardiovascular;  Laterality: N/A;  . TEE WITHOUT CARDIOVERSION N/A 12/31/2015   Procedure: TRANSESOPHAGEAL ECHOCARDIOGRAM (TEE);  Surgeon: [name], MD;  Location: [place];  Service: Open Heart Surgery;  Laterality: N/A;    Prior to Admission medications   Medication Sig Start Date End Date Taking? Authorizing Provider  clopidogrel (PLAVIX) 75 MG tablet Take 75 mg by mouth daily.   Yes [provider]  metoprolol tartrate (LOPRESSOR) 25 MG tablet Take 1 tablet (25 mg total) by mouth 2 (two) times daily. 09/21/16  Yes Bettey Costa, MD  aspirin EC 81 MG EC tablet Take 1 tablet (81 mg total) by mouth daily. 12/30/15   Teodoro Spray, MD  Cholecalciferol (VITAMIN D3) 5000 units CAPS Take 5,000 Units by mouth daily.     [provider]  cyclobenzaprine (FLEXERIL) 10 MG tablet Take 10 mg by mouth at bedtime.  11/17/14   [provider]  esomeprazole (NEXIUM) 20 MG capsule Take 20 mg by mouth every morning.     [provider]  Flaxseed, Linseed, (FLAX SEED OIL PO) Take 1,400 mg by mouth daily.    [provider]  gemfibrozil (LOPID) 600 MG tablet Take 600 mg by mouth 2 (two) times daily.  11/17/14   [provider]  Magnesium Oxide 400 MG CAPS Take 400 mg by mouth 2 (two) times daily.     [provider]  nitroGLYCERIN (NITROSTAT) 0.4 MG SL tablet Place 1 tablet (0.4 mg total) under the tongue every  5 (five) minutes as needed for chest pain. 09/21/16   Bettey Costa, MD  rosuvastatin (CRESTOR) 10 MG tablet Take 1 tablet (10 mg total) by mouth daily at 6 PM. 04/04/16   Daune Perch, NP    Allergies as of 03/29/2017 - Review Complete 09/20/2016  Allergen Reaction Noted  . Pravastatin Other (See Comments) 12/28/2015    Family History  Problem Relation Age of Onset  . Breast cancer Neg Hx     Social History   Socioeconomic History  . Marital status: Married    Spouse name: Not on file  . Number of children:  Not on file  . Years of education: Not on file  . Highest education level: Not on file  Occupational History  . Not on file  Social Needs  . Financial resource strain: Not on file  . Food insecurity:    Worry: Not on file    Inability: Not on file  . Transportation needs:    Medical: Not on file    Non-medical: Not on file  Tobacco Use  . Smoking status: Never Smoker  . Smokeless tobacco: Never Used  Substance and Sexual Activity  . Alcohol use: No  . Drug use: No  . Sexual activity: Not on file  Lifestyle  . Physical activity:    Days per week: Not on file    Minutes per session: Not on file  . Stress: Not on file  Relationships  . Social connections:    Talks on phone: Not on file    Gets together: Not on file    Attends religious service: Not on file    Active member of club or organization: Not on file    Attends meetings of clubs or organizations: Not on file    Relationship status: Not on file  . Intimate partner violence:    Fear of current or ex partner: Not on file    Emotionally abused: Not on file    Physically abused: Not on file    Forced sexual activity: Not on file  Other Topics Concern  . Not on file  Social History Narrative  . Not on file    Review of Systems: See HPI, otherwise negative ROS  Physical Exam: BP (!) 187/76   Pulse 60   Temp (!) 96.9 F (36.1 C) (Tympanic)   Resp 18   Ht 5' 4.5" (1.638 m)   Wt 63.5 kg (140 lb)   SpO2 100%   BMI 23.66 kg/m  General:   Alert,  pleasant and cooperative in NAD Head:  Normocephalic and atraumatic. Neck:  Supple; no masses or thyromegaly. Lungs:  Clear throughout to auscultation.    Heart:  Regular rate and rhythm. Abdomen:  Soft, nontender and nondistended. Normal bowel sounds, without guarding, and without rebound.   Neurologic:  Alert and  oriented x4;  grossly normal neurologically.  Impression/Plan: Sheena Rice is here for an colonoscopy to be performed for [facility] colon polyps  Risks,  benefits, limitations, and alternatives regarding  colonoscopy have been reviewed with the patient.  Questions have been answered.  All parties agreeable.   Gaylyn Cheers, MD  04/28/2017, 7:32 AM

## 2017-04-28 NOTE — Anesthesia Post-op Follow-up Note (Signed)
Anesthesia QCDR form completed.        

## 2017-05-29 ENCOUNTER — Ambulatory Visit
Admission: RE | Admit: 2017-05-29 | Payer: Medicare HMO | Source: Ambulatory Visit | Admitting: Unknown Physician Specialty

## 2017-05-29 ENCOUNTER — Encounter: Admission: RE | Payer: Self-pay | Source: Ambulatory Visit

## 2017-05-29 SURGERY — COLONOSCOPY WITH PROPOFOL
Anesthesia: General

## 2017-05-31 DIAGNOSIS — M1990 Unspecified osteoarthritis, unspecified site: Secondary | ICD-10-CM | POA: Diagnosis not present

## 2017-05-31 DIAGNOSIS — L720 Epidermal cyst: Secondary | ICD-10-CM | POA: Diagnosis not present

## 2017-05-31 DIAGNOSIS — M2540 Effusion, unspecified joint: Secondary | ICD-10-CM | POA: Diagnosis not present

## 2017-06-30 DIAGNOSIS — R079 Chest pain, unspecified: Secondary | ICD-10-CM | POA: Diagnosis not present

## 2017-06-30 DIAGNOSIS — E782 Mixed hyperlipidemia: Secondary | ICD-10-CM | POA: Diagnosis not present

## 2017-06-30 DIAGNOSIS — I249 Acute ischemic heart disease, unspecified: Secondary | ICD-10-CM | POA: Diagnosis not present

## 2017-06-30 DIAGNOSIS — R0602 Shortness of breath: Secondary | ICD-10-CM | POA: Diagnosis not present

## 2017-06-30 DIAGNOSIS — I10 Essential (primary) hypertension: Secondary | ICD-10-CM | POA: Diagnosis not present

## 2017-06-30 DIAGNOSIS — I2510 Atherosclerotic heart disease of native coronary artery without angina pectoris: Secondary | ICD-10-CM | POA: Diagnosis not present

## 2017-07-12 DIAGNOSIS — M79641 Pain in right hand: Secondary | ICD-10-CM | POA: Diagnosis not present

## 2017-07-12 DIAGNOSIS — M71341 Other bursal cyst, right hand: Secondary | ICD-10-CM | POA: Diagnosis not present

## 2017-07-12 DIAGNOSIS — M67449 Ganglion, unspecified hand: Secondary | ICD-10-CM | POA: Insufficient documentation

## 2017-07-18 DIAGNOSIS — R079 Chest pain, unspecified: Secondary | ICD-10-CM | POA: Diagnosis not present

## 2017-07-18 DIAGNOSIS — R0602 Shortness of breath: Secondary | ICD-10-CM | POA: Diagnosis not present

## 2017-08-01 DIAGNOSIS — I25708 Atherosclerosis of coronary artery bypass graft(s), unspecified, with other forms of angina pectoris: Secondary | ICD-10-CM | POA: Diagnosis not present

## 2017-08-01 DIAGNOSIS — I249 Acute ischemic heart disease, unspecified: Secondary | ICD-10-CM | POA: Diagnosis not present

## 2017-08-01 DIAGNOSIS — Z951 Presence of aortocoronary bypass graft: Secondary | ICD-10-CM | POA: Diagnosis not present

## 2017-08-01 DIAGNOSIS — E782 Mixed hyperlipidemia: Secondary | ICD-10-CM | POA: Diagnosis not present

## 2017-08-01 DIAGNOSIS — I2510 Atherosclerotic heart disease of native coronary artery without angina pectoris: Secondary | ICD-10-CM | POA: Diagnosis not present

## 2017-08-04 ENCOUNTER — Other Ambulatory Visit: Payer: Self-pay | Admitting: Cardiology

## 2017-08-09 ENCOUNTER — Other Ambulatory Visit: Payer: Self-pay | Admitting: Cardiology

## 2017-08-15 ENCOUNTER — Encounter
Admission: RE | Disposition: A | Discharge status: Home / Self Care | Payer: Self-pay | Source: Ambulatory Visit | Attending: Cardiology

## 2017-08-15 ENCOUNTER — Encounter: Payer: Self-pay | Admitting: *Deleted

## 2017-08-15 ENCOUNTER — Ambulatory Visit
Admission: RE | Admit: 2017-08-15 | Discharge: 2017-08-15 | Disposition: A | Discharge status: Home / Self Care | Payer: Medicare HMO | Source: Ambulatory Visit | Attending: Cardiology | Admitting: Cardiology

## 2017-08-15 DIAGNOSIS — R319 Hematuria, unspecified: Secondary | ICD-10-CM | POA: Diagnosis not present

## 2017-08-15 DIAGNOSIS — I10 Essential (primary) hypertension: Secondary | ICD-10-CM | POA: Diagnosis not present

## 2017-08-15 DIAGNOSIS — M810 Age-related osteoporosis without current pathological fracture: Secondary | ICD-10-CM | POA: Diagnosis not present

## 2017-08-15 DIAGNOSIS — Z7982 Long term (current) use of aspirin: Secondary | ICD-10-CM | POA: Insufficient documentation

## 2017-08-15 DIAGNOSIS — Z86010 Personal history of colonic polyps: Secondary | ICD-10-CM | POA: Insufficient documentation

## 2017-08-15 DIAGNOSIS — R079 Chest pain, unspecified: Secondary | ICD-10-CM

## 2017-08-15 DIAGNOSIS — Z951 Presence of aortocoronary bypass graft: Secondary | ICD-10-CM | POA: Diagnosis not present

## 2017-08-15 DIAGNOSIS — E7800 Pure hypercholesterolemia, unspecified: Secondary | ICD-10-CM | POA: Insufficient documentation

## 2017-08-15 DIAGNOSIS — Z9049 Acquired absence of other specified parts of digestive tract: Secondary | ICD-10-CM | POA: Diagnosis not present

## 2017-08-15 DIAGNOSIS — Z888 Allergy status to other drugs, medicaments and biological substances status: Secondary | ICD-10-CM | POA: Insufficient documentation

## 2017-08-15 DIAGNOSIS — R0602 Shortness of breath: Secondary | ICD-10-CM | POA: Diagnosis not present

## 2017-08-15 DIAGNOSIS — Z955 Presence of coronary angioplasty implant and graft: Secondary | ICD-10-CM | POA: Diagnosis not present

## 2017-08-15 DIAGNOSIS — I25810 Atherosclerosis of coronary artery bypass graft(s) without angina pectoris: Secondary | ICD-10-CM | POA: Insufficient documentation

## 2017-08-15 DIAGNOSIS — K210 Gastro-esophageal reflux disease with esophagitis: Secondary | ICD-10-CM | POA: Insufficient documentation

## 2017-08-15 DIAGNOSIS — Z7901 Long term (current) use of anticoagulants: Secondary | ICD-10-CM | POA: Insufficient documentation

## 2017-08-15 DIAGNOSIS — Z9889 Other specified postprocedural states: Secondary | ICD-10-CM | POA: Diagnosis not present

## 2017-08-15 DIAGNOSIS — R5382 Chronic fatigue, unspecified: Secondary | ICD-10-CM | POA: Diagnosis not present

## 2017-08-15 DIAGNOSIS — Z8261 Family history of arthritis: Secondary | ICD-10-CM | POA: Diagnosis not present

## 2017-08-15 DIAGNOSIS — Z7902 Long term (current) use of antithrombotics/antiplatelets: Secondary | ICD-10-CM | POA: Diagnosis not present

## 2017-08-15 DIAGNOSIS — Z79899 Other long term (current) drug therapy: Secondary | ICD-10-CM | POA: Insufficient documentation

## 2017-08-15 DIAGNOSIS — Z8249 Family history of ischemic heart disease and other diseases of the circulatory system: Secondary | ICD-10-CM | POA: Diagnosis not present

## 2017-08-15 DIAGNOSIS — I252 Old myocardial infarction: Secondary | ICD-10-CM | POA: Diagnosis not present

## 2017-08-15 DIAGNOSIS — R0789 Other chest pain: Secondary | ICD-10-CM | POA: Diagnosis present

## 2017-08-15 DIAGNOSIS — I25110 Atherosclerotic heart disease of native coronary artery with unstable angina pectoris: Secondary | ICD-10-CM | POA: Diagnosis not present

## 2017-08-15 HISTORY — PX: LEFT HEART CATH AND CORONARY ANGIOGRAPHY: CATH118249

## 2017-08-15 SURGERY — LEFT HEART CATH AND CORONARY ANGIOGRAPHY
Anesthesia: Moderate Sedation | Laterality: Left

## 2017-08-15 MED ORDER — ASPIRIN 81 MG PO CHEW: 81.0000 mg | CHEWABLE_TABLET | ORAL | Status: DC

## 2017-08-15 MED ORDER — SODIUM CHLORIDE 0.9 % WEIGHT BASED INFUSION: 1.0000 mL/kg/h | INTRAVENOUS | Status: DC

## 2017-08-15 MED ORDER — SODIUM CHLORIDE 0.9% FLUSH: 3.0000 mL | INTRAVENOUS | Status: DC | PRN

## 2017-08-15 MED ORDER — MIDAZOLAM HCL 2 MG/2ML IJ SOLN
INTRAMUSCULAR | Status: AC
  Filled 2017-08-15: qty 2

## 2017-08-15 MED ORDER — HEPARIN (PORCINE) IN NACL 1000-0.9 UT/500ML-% IV SOLN
INTRAVENOUS | Status: AC
  Filled 2017-08-15: qty 1000

## 2017-08-15 MED ORDER — IOPAMIDOL (ISOVUE-300) INJECTION 61%
INTRAVENOUS | Status: DC | PRN
  Administered 2017-08-15: 120 mL via INTRAVENOUS

## 2017-08-15 MED ORDER — SODIUM CHLORIDE 0.9% FLUSH: 3.0000 mL | Freq: Two times a day (BID) | INTRAVENOUS | Status: DC

## 2017-08-15 MED ORDER — LIDOCAINE HCL (PF) 1 % IJ SOLN
INTRAMUSCULAR | Status: AC
  Filled 2017-08-15: qty 30

## 2017-08-15 MED ORDER — ACETAMINOPHEN 325 MG PO TABS: 650.0000 mg | ORAL_TABLET | ORAL | Status: DC | PRN

## 2017-08-15 MED ORDER — SODIUM CHLORIDE 0.9 % WEIGHT BASED INFUSION
3.0000 mL/kg/h | INTRAVENOUS | Status: AC
  Administered 2017-08-15: 3 mL/kg/h via INTRAVENOUS

## 2017-08-15 MED ORDER — FENTANYL CITRATE (PF) 100 MCG/2ML IJ SOLN
INTRAMUSCULAR | Status: DC | PRN
  Administered 2017-08-15: 25 ug via INTRAVENOUS

## 2017-08-15 MED ORDER — SODIUM CHLORIDE 0.9 % IV SOLN: 250.0000 mL | INTRAVENOUS | Status: DC | PRN

## 2017-08-15 MED ORDER — FENTANYL CITRATE (PF) 100 MCG/2ML IJ SOLN
INTRAMUSCULAR | Status: AC
  Filled 2017-08-15: qty 2

## 2017-08-15 MED ORDER — ASPIRIN 81 MG PO CHEW
CHEWABLE_TABLET | ORAL | Status: AC
  Filled 2017-08-15: qty 1

## 2017-08-15 MED ORDER — ONDANSETRON HCL 4 MG/2ML IJ SOLN
4.0000 mg | Freq: Four times a day (QID) | INTRAMUSCULAR | Status: DC | PRN
Start: 2017-08-15 — End: 2017-08-15

## 2017-08-15 MED ORDER — MIDAZOLAM HCL 2 MG/2ML IJ SOLN
INTRAMUSCULAR | Status: DC | PRN
  Administered 2017-08-15: 1 mg via INTRAVENOUS

## 2017-08-15 SURGICAL SUPPLY — 11 items
CATH INFINITI 5 FR MPA2 (CATHETERS) ×2 IMPLANT
CATH INFINITI 5FR ANG PIGTAIL (CATHETERS) ×2 IMPLANT
CATH INFINITI 5FR JL4 (CATHETERS) ×2 IMPLANT
CATH INFINITI JR4 5F (CATHETERS) ×2 IMPLANT
DEVICE CLOSURE MYNXGRIP 5F (Vascular Products) ×2 IMPLANT
KIT MANI 3VAL PERCEP (MISCELLANEOUS) ×3 IMPLANT
NDL PERC 18GX7CM (NEEDLE) IMPLANT
NEEDLE PERC 18GX7CM (NEEDLE) ×3 IMPLANT
PACK CARDIAC CATH (CUSTOM PROCEDURE TRAY) ×3 IMPLANT
SHEATH AVANTI 5FR X 11CM (SHEATH) ×2 IMPLANT
WIRE GUIDERIGHT .035X150 (WIRE) ×2 IMPLANT

## 2017-08-15 NOTE — H&P (Signed)
Chief Complaint: Chief Complaint  Patient presents with  . Follow-up  stress and echo  Date of Service: 08/01/2017 Date of Birth: 20-Dec-1940 PCP: Velna Ochs, MD  History of Present Illness: Sheena Rice is a 77 y.o.female patient who follow-up after coronary artery bypass grafting. Patient has had approximately 3 month history of midsternal and left arm chest pain. She was referred to our office where she underwent left cardiac catheterization which revealed significant coronary disease with a 99% RCA, 90% proximal LAD involving the bifurcation into the first diagonal, 90% mid left circumflex involving the takeoff of the OM 2. She had preserved LV function. She was admitted after the cath and referred for coronary artery bypass grafting which was carried out in [facility]. She had a left internal mammary to the LAD, saphenous vein graft to the OM, saphenous vein graft to the diagonal which was a Y graft off the OM vein and a saphenous vein graft to the PDA. This was carried out on December 31, 2015. She had been doing well then developed further chest pain. She was evaluated with a left cardiac catheterization on April 01, 2016. She was noted to have a chronic total occlusion of the distal RCA. She underwent placement of a synergy 2.25 x 38 mm drug-eluting stent in the distal RCA. She was treated with dual antiplatelet therapy with aspirin and [name] with recommendations this be continued for 1 year. She had been having some exertional fatigue and chest pain. Functional study revealed no reversible ischemia. She has had intermittent episodes of dyspnea on exertion. She has been on dual antiplatelet therapy with aspirin and [name] for 1 year post PCI and was recently changed to Plavix from [name] due to shortness of breath. This is not entirely improved it.. She still has exertional shortness of breath and fatigue. Functional study revealed no evidence of reversible ischemia however her  symptoms persist similar to what she had prior to her bypass and stent. There is moderate MR and TR but these do not appear to explain her symptoms. We will need to pursue left heart cath to better evaluate her anatomy. Past Medical and Surgical History  Past Medical History Past Medical History:  Diagnosis Date  . Chronic fatigue syndrome, unspecified  . Chronic reflux esophagitis  with history of chronic gastritis.  . Colon polyp  . Coronary artery disease  . History of rheumatic fever  . Hx of adenomatous colonic polyps 09/06/2016  . Hyperlipidemia  . Hypertension  . Osteoporosis, post-menopausal   Past Surgical History She has a past surgical history that includes Cholecystectomy; Appendectomy (11/2012); Dental implant; Cardiac cath (04/2009); Cardiac surgery; Coronary artery bypass graft; egd (08/01/2011, 04/25/2006, 09/04/1998); flexible sigmoidoscopy (10/18/1993); Colonoscopy (09/04/1998, 11/18/1993); Colonoscopy (02/17/2003); Colonoscopy (04/25/2006); Colonoscopy (08/01/2011); and Colonoscopy.   Medications and Allergies  Current Medications  Current Outpatient Medications  Medication Sig Dispense Refill  . aspirin 81 MG chewable tablet Take 81 mg by mouth once daily  . cholecalciferol, vitamin D3, 3,000 unit Tab Take 1 tablet by mouth once daily.  . clopidogrel (PLAVIX) 75 mg tablet Take 1 tablet (75 mg total) by mouth once daily 30 tablet 11  . cyclobenzaprine (FLEXERIL) 10 MG tablet Take 1 tablet (10 mg total) by mouth once daily 90 tablet 2  . esomeprazole (NEXIUM) 20 MG DR capsule Take 20 mg by mouth once daily.  Marland Kitchen FLAXSEED ORAL Take 1,400 mg by mouth.  Marland Kitchen gemfibrozil (LOPID) 600 mg tablet TAKE ONE TABLET BY MOUTH  TWICE DAILY BEFORE MEALS 180 tablet 3  . losartan (COZAAR) 25 MG tablet Take 1 tablet (25 mg total) by mouth once daily 30 tablet 11  . magnesium oxide 400 mg Take 1 capsule by mouth 2 (two) times daily.  . metoprolol tartrate (LOPRESSOR) 25 MG tablet TAKE ONE  TABLET BY MOUTH TWICE DAILY 60 tablet 11  . nitroGLYcerin (NITROSTAT) 0.4 MG SL tablet Place 1 tablet (0.4 mg total) under the tongue every 5 (five) minutes as needed for Chest pain May take up to 3 doses. 25 tablet 0  . rosuvastatin (CRESTOR) 10 MG tablet TAKE 1 TABLET EVERY DAY 90 tablet 1   No current facility-administered medications for this visit.   Allergies: Pravachol [pravastatin] and Zocor [simvastatin]  Social and Family History  Social History reports that she has never smoked. She has never used smokeless tobacco. She reports that she does not drink alcohol or use drugs.  Family History Family History  Problem Relation Age of Onset  . Arthritis Father  rheumatoid  . Coronary Artery Disease (Blocked arteries around heart) Sister  S/P stent  . Coronary Artery Disease (Blocked arteries around heart) Brother  s/p CABG  . Colon cancer Sister  . Coronary Artery Disease (Blocked arteries around heart) Sister   Review of Systems  Review of Systems  Constitutional: Positive for malaise/fatigue.  HENT: Negative.  Eyes: Negative.  Respiratory: Positive for shortness of breath.  Gastrointestinal: Negative.  Genitourinary: Negative.  Musculoskeletal: Negative.  Skin: Negative.  Neurological: Negative.  Endo/Heme/Allergies: Negative.  Psychiatric/Behavioral: Negative.   Physical Examination   Vitals:BP 110/64  Pulse 56  Resp 12  Ht 163.8 cm (5' 4.5")  Wt 64.9 kg (143 lb)  BMI 24.17 kg/m  Ht:163.8 cm (5' 4.5") Wt:64.9 kg (143 lb) UMP:NTIR surface area is 1.72 meters squared. Body mass index is 24.17 kg/m.  Wt Readings from Last 3 Encounters:  08/01/17 64.9 kg (143 lb)  06/30/17 65.3 kg (144 lb)  03/21/17 67.6 kg (149 lb)   BP Readings from Last 3 Encounters:  08/01/17 110/64  06/30/17 124/60  03/21/17 139/67   General appearance appears in no acute distress  Head Mouth and Eye exam Normocephalic, without obvious abnormality, atraumatic Dentition is  good Eyes appear anicteric   LUNGS Breath Sounds: Normal Percussion: Normal  CARDIOVASCULAR JVP CV wave: no HJR: no Elevation at 90 degrees: None Carotid Pulse: normal pulsation bilaterally Bruit: None Apex: apical impulse normal  Auscultation Rhythm: normal sinus rhythm S1: normal S2: normal Clicks: no Rub: no Murmurs: no murmurs  Gallop: None  EXTREMITIES Clubbing: no Edema: trace to 1+ bilateral pedal edema Pulses: peripheral pulses symmetrical Femoral Bruits: no Amputation: no SKIN Rash: no Cyanosis: no Embolic phemonenon: no Bruising: no NEURO Alert and Oriented to person, place and time: yes Non focal: yes  PSYCH: Pt appears to have normal affect  LABS REVIEWED Last 3 CBC results: Lab Results  Component Value Date  WBC 4.1 08/01/2017  WBC 4.8 02/28/2017  WBC 5.1 12/28/2015   Lab Results  Component Value Date  HGB 13.6 08/01/2017  HGB 13.2 02/28/2017  HGB 14.1 12/28/2015   Lab Results  Component Value Date  HCT 41.0 08/01/2017  HCT 40.1 02/28/2017  HCT 41.4 12/28/2015   Lab Results  Component Value Date  PLT 220 08/01/2017  PLT 210 02/28/2017  PLT 263 12/28/2015   Lab Results  Component Value Date  CREATININE 0.9 08/01/2017  BUN 18 08/01/2017  NA 137 08/01/2017  K 4.2 08/01/2017  CL  103 08/01/2017  CO2 27.9 08/01/2017   Lab Results  Component Value Date  HDL 38.8 02/28/2017  HDL 41.3 03/19/2015  HDL 39.4 09/18/2014   Lab Results  Component Value Date  LDLCALC 98 02/28/2017  LDLCALC 125 03/19/2015  LDLCALC 149 (H) 09/18/2014   Lab Results  Component Value Date  TRIG 168 02/28/2017  TRIG 131 03/19/2015  TRIG 131 09/18/2014   Lab Results  Component Value Date  ALT 20 02/28/2017  AST 25 02/28/2017  ALKPHOS 67 02/28/2017   Lab Results  Component Value Date  TSH 1.693 03/19/2015   .  Assessment and Plan   77 y.o. female with  ICD-10-CM ICD-9-CM  1. Essential hypertension-pressure is somewhat low. Will  continue with metoprolol and follow. I10 401.9  2. Pure hypercholesterolemia and intolerant of statins. Will continue with gemfibrozil and Colestid. E78.00 272.0  3. Coronary artery disease-99% RCA, 90% proximal LAD involving the bifurcation into the first diagonal, 90% mid left circumflex involving the takeoff of the OM 2. She had preserved LV function. She is status post coronary bypass grafting with aleft internal mammary to the LAD, saphenous vein graft to the OM, saphenous vein graft to the diagonal which was a Y graft off the OM vein and a saphenous vein graft to the PDA. This was carried out on December 31, 2015. She is status post non-ST elevation myocardial infarction with chronic total occlusion of the RCA. She underwent PCI of this with a drug-eluting stent. She continues with 75 mg of clopidogrel and enteric-coated aspirin. Due to continued exertional symptoms, will proceed with a cardiac cath     2. Shortness of breath-chest x-ray showed no acute cardiopulmonary abnormalities. As per above. We will proceed with cardiac cath.  3. Hypertension-patient's blood pressure is currently well controlled. Will continue with metoprolol.  4. Hyperlipidemia-we will continue with Crestor 10 mg daily. LDL goal of less than 100.  5. Hematuria-we will proceed with a urinalysis to evaluate for evidence of UTI. We will continue with current regimen until this is back. Patient is afebrile and has no symptoms of UTI.  Return in about 4 weeks (around 08/29/2017).  These notes generated with voice recognition software. I apologize for typographical errors.  Sydnee Levans, MD    Pt seen and examined. No change from above.

## 2017-08-28 DIAGNOSIS — I10 Essential (primary) hypertension: Secondary | ICD-10-CM | POA: Diagnosis not present

## 2017-08-28 DIAGNOSIS — E119 Type 2 diabetes mellitus without complications: Secondary | ICD-10-CM | POA: Diagnosis not present

## 2017-08-29 DIAGNOSIS — I10 Essential (primary) hypertension: Secondary | ICD-10-CM | POA: Diagnosis not present

## 2017-08-29 DIAGNOSIS — I25118 Atherosclerotic heart disease of native coronary artery with other forms of angina pectoris: Secondary | ICD-10-CM | POA: Diagnosis not present

## 2017-08-29 DIAGNOSIS — I2510 Atherosclerotic heart disease of native coronary artery without angina pectoris: Secondary | ICD-10-CM | POA: Diagnosis not present

## 2017-08-29 DIAGNOSIS — Z951 Presence of aortocoronary bypass graft: Secondary | ICD-10-CM | POA: Diagnosis not present

## 2017-08-29 DIAGNOSIS — E782 Mixed hyperlipidemia: Secondary | ICD-10-CM | POA: Diagnosis not present

## 2017-08-29 DIAGNOSIS — I249 Acute ischemic heart disease, unspecified: Secondary | ICD-10-CM | POA: Diagnosis not present

## 2017-09-04 DIAGNOSIS — I2510 Atherosclerotic heart disease of native coronary artery without angina pectoris: Secondary | ICD-10-CM | POA: Diagnosis not present

## 2017-09-04 DIAGNOSIS — E119 Type 2 diabetes mellitus without complications: Secondary | ICD-10-CM | POA: Diagnosis not present

## 2017-09-04 DIAGNOSIS — E782 Mixed hyperlipidemia: Secondary | ICD-10-CM | POA: Diagnosis not present

## 2017-09-04 DIAGNOSIS — Z0001 Encounter for general adult medical examination with abnormal findings: Secondary | ICD-10-CM | POA: Diagnosis not present

## 2017-09-04 DIAGNOSIS — I10 Essential (primary) hypertension: Secondary | ICD-10-CM | POA: Diagnosis not present

## 2017-09-12 ENCOUNTER — Other Ambulatory Visit: Payer: Self-pay | Admitting: Obstetrics and Gynecology

## 2017-09-12 DIAGNOSIS — Z1231 Encounter for screening mammogram for malignant neoplasm of breast: Secondary | ICD-10-CM

## 2017-09-12 DIAGNOSIS — Z124 Encounter for screening for malignant neoplasm of cervix: Secondary | ICD-10-CM | POA: Diagnosis not present

## 2017-09-12 DIAGNOSIS — Z01419 Encounter for gynecological examination (general) (routine) without abnormal findings: Secondary | ICD-10-CM | POA: Diagnosis not present

## 2017-09-12 DIAGNOSIS — Z8739 Personal history of other diseases of the musculoskeletal system and connective tissue: Secondary | ICD-10-CM | POA: Diagnosis not present

## 2017-09-25 DIAGNOSIS — M8588 Other specified disorders of bone density and structure, other site: Secondary | ICD-10-CM | POA: Diagnosis not present

## 2017-09-27 ENCOUNTER — Ambulatory Visit
Admission: RE | Admit: 2017-09-27 | Discharge: 2017-09-27 | Disposition: A | Discharge status: Home / Self Care | Payer: Medicare HMO | Source: Ambulatory Visit | Attending: Obstetrics and Gynecology | Admitting: Obstetrics and Gynecology

## 2017-09-27 DIAGNOSIS — Z1231 Encounter for screening mammogram for malignant neoplasm of breast: Secondary | ICD-10-CM | POA: Diagnosis not present

## 2017-11-07 DIAGNOSIS — L720 Epidermal cyst: Secondary | ICD-10-CM | POA: Diagnosis not present

## 2017-11-07 DIAGNOSIS — Z1283 Encounter for screening for malignant neoplasm of skin: Secondary | ICD-10-CM | POA: Diagnosis not present

## 2017-11-07 DIAGNOSIS — L905 Scar conditions and fibrosis of skin: Secondary | ICD-10-CM | POA: Diagnosis not present

## 2017-11-07 DIAGNOSIS — I788 Other diseases of capillaries: Secondary | ICD-10-CM | POA: Diagnosis not present

## 2017-11-07 DIAGNOSIS — D1800 Hemangioma unspecified site: Secondary | ICD-10-CM | POA: Diagnosis not present

## 2017-11-07 DIAGNOSIS — L739 Follicular disorder, unspecified: Secondary | ICD-10-CM | POA: Diagnosis not present

## 2017-11-07 DIAGNOSIS — D2239 Melanocytic nevi of other parts of face: Secondary | ICD-10-CM | POA: Diagnosis not present

## 2017-11-07 DIAGNOSIS — L821 Other seborrheic keratosis: Secondary | ICD-10-CM | POA: Diagnosis not present

## 2017-11-30 DIAGNOSIS — E782 Mixed hyperlipidemia: Secondary | ICD-10-CM | POA: Diagnosis not present

## 2017-11-30 DIAGNOSIS — I249 Acute ischemic heart disease, unspecified: Secondary | ICD-10-CM | POA: Diagnosis not present

## 2017-11-30 DIAGNOSIS — I10 Essential (primary) hypertension: Secondary | ICD-10-CM | POA: Diagnosis not present

## 2017-11-30 DIAGNOSIS — Z951 Presence of aortocoronary bypass graft: Secondary | ICD-10-CM | POA: Diagnosis not present

## 2017-11-30 DIAGNOSIS — R5382 Chronic fatigue, unspecified: Secondary | ICD-10-CM | POA: Diagnosis not present

## 2017-12-27 DIAGNOSIS — E119 Type 2 diabetes mellitus without complications: Secondary | ICD-10-CM | POA: Diagnosis not present

## 2018-01-03 DIAGNOSIS — I10 Essential (primary) hypertension: Secondary | ICD-10-CM | POA: Diagnosis not present

## 2018-01-03 DIAGNOSIS — E119 Type 2 diabetes mellitus without complications: Secondary | ICD-10-CM | POA: Insufficient documentation

## 2018-01-03 DIAGNOSIS — I2510 Atherosclerotic heart disease of native coronary artery without angina pectoris: Secondary | ICD-10-CM | POA: Diagnosis not present

## 2018-01-03 DIAGNOSIS — Z23 Encounter for immunization: Secondary | ICD-10-CM | POA: Diagnosis not present

## 2018-01-03 DIAGNOSIS — E782 Mixed hyperlipidemia: Secondary | ICD-10-CM | POA: Diagnosis not present

## 2018-01-03 DIAGNOSIS — K210 Gastro-esophageal reflux disease with esophagitis: Secondary | ICD-10-CM | POA: Diagnosis not present

## 2018-02-19 DIAGNOSIS — H524 Presbyopia: Secondary | ICD-10-CM | POA: Diagnosis not present

## 2018-06-28 DIAGNOSIS — E119 Type 2 diabetes mellitus without complications: Secondary | ICD-10-CM | POA: Diagnosis not present

## 2018-06-28 DIAGNOSIS — I10 Essential (primary) hypertension: Secondary | ICD-10-CM | POA: Diagnosis not present

## 2018-07-05 DIAGNOSIS — I2510 Atherosclerotic heart disease of native coronary artery without angina pectoris: Secondary | ICD-10-CM | POA: Diagnosis not present

## 2018-07-05 DIAGNOSIS — E782 Mixed hyperlipidemia: Secondary | ICD-10-CM | POA: Diagnosis not present

## 2018-07-05 DIAGNOSIS — I10 Essential (primary) hypertension: Secondary | ICD-10-CM | POA: Diagnosis not present

## 2018-07-05 DIAGNOSIS — I25118 Atherosclerotic heart disease of native coronary artery with other forms of angina pectoris: Secondary | ICD-10-CM | POA: Diagnosis not present

## 2018-07-05 DIAGNOSIS — Z0000 Encounter for general adult medical examination without abnormal findings: Secondary | ICD-10-CM | POA: Diagnosis not present

## 2018-07-05 DIAGNOSIS — E119 Type 2 diabetes mellitus without complications: Secondary | ICD-10-CM | POA: Diagnosis not present

## 2018-07-06 DIAGNOSIS — E782 Mixed hyperlipidemia: Secondary | ICD-10-CM | POA: Diagnosis not present

## 2018-07-06 DIAGNOSIS — R079 Chest pain, unspecified: Secondary | ICD-10-CM | POA: Diagnosis not present

## 2018-07-06 DIAGNOSIS — Z951 Presence of aortocoronary bypass graft: Secondary | ICD-10-CM | POA: Diagnosis not present

## 2018-07-06 DIAGNOSIS — E119 Type 2 diabetes mellitus without complications: Secondary | ICD-10-CM | POA: Diagnosis not present

## 2018-07-06 DIAGNOSIS — I2510 Atherosclerotic heart disease of native coronary artery without angina pectoris: Secondary | ICD-10-CM | POA: Diagnosis not present

## 2018-07-06 DIAGNOSIS — I10 Essential (primary) hypertension: Secondary | ICD-10-CM | POA: Diagnosis not present

## 2018-08-03 DIAGNOSIS — Z951 Presence of aortocoronary bypass graft: Secondary | ICD-10-CM | POA: Diagnosis not present

## 2018-08-03 DIAGNOSIS — E782 Mixed hyperlipidemia: Secondary | ICD-10-CM | POA: Diagnosis not present

## 2018-08-03 DIAGNOSIS — I249 Acute ischemic heart disease, unspecified: Secondary | ICD-10-CM | POA: Diagnosis not present

## 2018-08-03 DIAGNOSIS — I2510 Atherosclerotic heart disease of native coronary artery without angina pectoris: Secondary | ICD-10-CM | POA: Diagnosis not present

## 2018-08-03 DIAGNOSIS — I10 Essential (primary) hypertension: Secondary | ICD-10-CM | POA: Diagnosis not present

## 2018-08-03 DIAGNOSIS — E119 Type 2 diabetes mellitus without complications: Secondary | ICD-10-CM | POA: Diagnosis not present

## 2018-08-14 ENCOUNTER — Other Ambulatory Visit: Payer: Self-pay | Admitting: Obstetrics and Gynecology

## 2018-08-14 DIAGNOSIS — Z1231 Encounter for screening mammogram for malignant neoplasm of breast: Secondary | ICD-10-CM

## 2018-08-21 DIAGNOSIS — H903 Sensorineural hearing loss, bilateral: Secondary | ICD-10-CM | POA: Diagnosis not present

## 2018-08-21 DIAGNOSIS — H6123 Impacted cerumen, bilateral: Secondary | ICD-10-CM | POA: Diagnosis not present

## 2018-09-18 DIAGNOSIS — Z124 Encounter for screening for malignant neoplasm of cervix: Secondary | ICD-10-CM | POA: Diagnosis not present

## 2018-10-01 ENCOUNTER — Ambulatory Visit
Admission: RE | Admit: 2018-10-01 | Discharge: 2018-10-01 | Disposition: A | Discharge status: Home / Self Care | Payer: Medicare HMO | Source: Ambulatory Visit | Attending: Obstetrics and Gynecology | Admitting: Obstetrics and Gynecology

## 2018-10-01 DIAGNOSIS — Z1231 Encounter for screening mammogram for malignant neoplasm of breast: Secondary | ICD-10-CM | POA: Diagnosis not present

## 2018-10-18 ENCOUNTER — Other Ambulatory Visit: Payer: Self-pay

## 2018-10-18 DIAGNOSIS — Z20828 Contact with and (suspected) exposure to other viral communicable diseases: Secondary | ICD-10-CM | POA: Diagnosis not present

## 2018-10-18 DIAGNOSIS — Z20822 Contact with and (suspected) exposure to covid-19: Secondary | ICD-10-CM

## 2018-10-19 LAB — NOVEL CORONAVIRUS, NAA: SARS-CoV-2, NAA: NOT DETECTED

## 2018-10-22 ENCOUNTER — Other Ambulatory Visit: Payer: Self-pay

## 2018-10-22 DIAGNOSIS — Z20828 Contact with and (suspected) exposure to other viral communicable diseases: Secondary | ICD-10-CM | POA: Diagnosis not present

## 2018-10-22 DIAGNOSIS — Z20822 Contact with and (suspected) exposure to covid-19: Secondary | ICD-10-CM

## 2018-10-23 LAB — NOVEL CORONAVIRUS, NAA: SARS-CoV-2, NAA: DETECTED — AB

## 2018-11-08 DIAGNOSIS — Z951 Presence of aortocoronary bypass graft: Secondary | ICD-10-CM | POA: Diagnosis not present

## 2018-11-08 DIAGNOSIS — U071 COVID-19: Secondary | ICD-10-CM | POA: Diagnosis not present

## 2018-11-08 DIAGNOSIS — I252 Old myocardial infarction: Secondary | ICD-10-CM | POA: Diagnosis not present

## 2018-11-12 DIAGNOSIS — L814 Other melanin hyperpigmentation: Secondary | ICD-10-CM | POA: Diagnosis not present

## 2018-11-12 DIAGNOSIS — L821 Other seborrheic keratosis: Secondary | ICD-10-CM | POA: Diagnosis not present

## 2018-11-12 DIAGNOSIS — L905 Scar conditions and fibrosis of skin: Secondary | ICD-10-CM | POA: Diagnosis not present

## 2018-11-12 DIAGNOSIS — D692 Other nonthrombocytopenic purpura: Secondary | ICD-10-CM | POA: Diagnosis not present

## 2018-11-12 DIAGNOSIS — Z1283 Encounter for screening for malignant neoplasm of skin: Secondary | ICD-10-CM | POA: Diagnosis not present

## 2018-11-12 DIAGNOSIS — D225 Melanocytic nevi of trunk: Secondary | ICD-10-CM | POA: Diagnosis not present

## 2018-11-12 DIAGNOSIS — L304 Erythema intertrigo: Secondary | ICD-10-CM | POA: Diagnosis not present

## 2018-11-12 DIAGNOSIS — D1800 Hemangioma unspecified site: Secondary | ICD-10-CM | POA: Diagnosis not present

## 2018-11-21 DIAGNOSIS — E119 Type 2 diabetes mellitus without complications: Secondary | ICD-10-CM | POA: Diagnosis not present

## 2018-11-21 DIAGNOSIS — Z951 Presence of aortocoronary bypass graft: Secondary | ICD-10-CM | POA: Diagnosis not present

## 2018-11-21 DIAGNOSIS — I10 Essential (primary) hypertension: Secondary | ICD-10-CM | POA: Diagnosis not present

## 2018-11-21 DIAGNOSIS — I2510 Atherosclerotic heart disease of native coronary artery without angina pectoris: Secondary | ICD-10-CM | POA: Diagnosis not present

## 2018-11-21 DIAGNOSIS — E782 Mixed hyperlipidemia: Secondary | ICD-10-CM | POA: Diagnosis not present

## 2018-11-21 DIAGNOSIS — I249 Acute ischemic heart disease, unspecified: Secondary | ICD-10-CM | POA: Diagnosis not present

## 2018-11-22 DIAGNOSIS — U071 COVID-19: Secondary | ICD-10-CM | POA: Diagnosis not present

## 2018-11-22 DIAGNOSIS — Z23 Encounter for immunization: Secondary | ICD-10-CM | POA: Diagnosis not present

## 2019-01-02 DIAGNOSIS — E119 Type 2 diabetes mellitus without complications: Secondary | ICD-10-CM | POA: Diagnosis not present

## 2019-01-09 DIAGNOSIS — E119 Type 2 diabetes mellitus without complications: Secondary | ICD-10-CM | POA: Diagnosis not present

## 2019-01-09 DIAGNOSIS — Z23 Encounter for immunization: Secondary | ICD-10-CM | POA: Diagnosis not present

## 2019-01-09 DIAGNOSIS — I10 Essential (primary) hypertension: Secondary | ICD-10-CM | POA: Diagnosis not present

## 2019-01-09 DIAGNOSIS — Z0001 Encounter for general adult medical examination with abnormal findings: Secondary | ICD-10-CM | POA: Diagnosis not present

## 2019-02-15 DIAGNOSIS — H903 Sensorineural hearing loss, bilateral: Secondary | ICD-10-CM | POA: Diagnosis not present

## 2019-02-19 DIAGNOSIS — I2510 Atherosclerotic heart disease of native coronary artery without angina pectoris: Secondary | ICD-10-CM | POA: Diagnosis not present

## 2019-02-19 DIAGNOSIS — E782 Mixed hyperlipidemia: Secondary | ICD-10-CM | POA: Diagnosis not present

## 2019-02-19 DIAGNOSIS — Z951 Presence of aortocoronary bypass graft: Secondary | ICD-10-CM | POA: Diagnosis not present

## 2019-02-19 DIAGNOSIS — I10 Essential (primary) hypertension: Secondary | ICD-10-CM | POA: Diagnosis not present

## 2019-02-19 DIAGNOSIS — E119 Type 2 diabetes mellitus without complications: Secondary | ICD-10-CM | POA: Diagnosis not present

## 2019-02-19 DIAGNOSIS — I25118 Atherosclerotic heart disease of native coronary artery with other forms of angina pectoris: Secondary | ICD-10-CM | POA: Diagnosis not present

## 2019-03-05 DIAGNOSIS — H524 Presbyopia: Secondary | ICD-10-CM | POA: Diagnosis not present

## 2019-03-05 DIAGNOSIS — Z0100 Encounter for examination of eyes and vision without abnormal findings: Secondary | ICD-10-CM | POA: Diagnosis not present

## 2019-04-07 DIAGNOSIS — Z23 Encounter for immunization: Secondary | ICD-10-CM | POA: Diagnosis not present

## 2019-04-07 DIAGNOSIS — M810 Age-related osteoporosis without current pathological fracture: Secondary | ICD-10-CM | POA: Diagnosis not present

## 2019-04-07 DIAGNOSIS — Z7902 Long term (current) use of antithrombotics/antiplatelets: Secondary | ICD-10-CM | POA: Diagnosis not present

## 2019-04-07 DIAGNOSIS — M160 Bilateral primary osteoarthritis of hip: Secondary | ICD-10-CM | POA: Diagnosis not present

## 2019-04-07 DIAGNOSIS — S300XXA Contusion of lower back and pelvis, initial encounter: Secondary | ICD-10-CM | POA: Diagnosis not present

## 2019-04-07 DIAGNOSIS — R208 Other disturbances of skin sensation: Secondary | ICD-10-CM | POA: Diagnosis not present

## 2019-04-07 DIAGNOSIS — S60512A Abrasion of left hand, initial encounter: Secondary | ICD-10-CM | POA: Diagnosis not present

## 2019-04-07 DIAGNOSIS — I10 Essential (primary) hypertension: Secondary | ICD-10-CM | POA: Diagnosis not present

## 2019-04-07 DIAGNOSIS — M25552 Pain in left hip: Secondary | ICD-10-CM | POA: Diagnosis not present

## 2019-05-21 DIAGNOSIS — Z951 Presence of aortocoronary bypass graft: Secondary | ICD-10-CM | POA: Diagnosis not present

## 2019-05-21 DIAGNOSIS — E119 Type 2 diabetes mellitus without complications: Secondary | ICD-10-CM | POA: Diagnosis not present

## 2019-05-21 DIAGNOSIS — I2510 Atherosclerotic heart disease of native coronary artery without angina pectoris: Secondary | ICD-10-CM | POA: Diagnosis not present

## 2019-05-21 DIAGNOSIS — E782 Mixed hyperlipidemia: Secondary | ICD-10-CM | POA: Diagnosis not present

## 2019-05-21 DIAGNOSIS — I10 Essential (primary) hypertension: Secondary | ICD-10-CM | POA: Diagnosis not present

## 2019-07-03 DIAGNOSIS — E119 Type 2 diabetes mellitus without complications: Secondary | ICD-10-CM | POA: Diagnosis not present

## 2019-07-10 DIAGNOSIS — Z0000 Encounter for general adult medical examination without abnormal findings: Secondary | ICD-10-CM | POA: Diagnosis not present

## 2019-07-10 DIAGNOSIS — I2510 Atherosclerotic heart disease of native coronary artery without angina pectoris: Secondary | ICD-10-CM | POA: Diagnosis not present

## 2019-07-10 DIAGNOSIS — K2100 Gastro-esophageal reflux disease with esophagitis, without bleeding: Secondary | ICD-10-CM | POA: Diagnosis not present

## 2019-07-10 DIAGNOSIS — E119 Type 2 diabetes mellitus without complications: Secondary | ICD-10-CM | POA: Diagnosis not present

## 2019-07-10 DIAGNOSIS — I10 Essential (primary) hypertension: Secondary | ICD-10-CM | POA: Diagnosis not present

## 2019-07-10 DIAGNOSIS — E782 Mixed hyperlipidemia: Secondary | ICD-10-CM | POA: Diagnosis not present

## 2019-09-25 ENCOUNTER — Other Ambulatory Visit: Payer: Self-pay | Admitting: Obstetrics and Gynecology

## 2019-09-25 DIAGNOSIS — Z1231 Encounter for screening mammogram for malignant neoplasm of breast: Secondary | ICD-10-CM

## 2019-09-25 DIAGNOSIS — Z8739 Personal history of other diseases of the musculoskeletal system and connective tissue: Secondary | ICD-10-CM | POA: Diagnosis not present

## 2019-09-25 DIAGNOSIS — Z124 Encounter for screening for malignant neoplasm of cervix: Secondary | ICD-10-CM | POA: Diagnosis not present

## 2019-10-02 DIAGNOSIS — M8588 Other specified disorders of bone density and structure, other site: Secondary | ICD-10-CM | POA: Diagnosis not present

## 2019-10-04 ENCOUNTER — Ambulatory Visit
Admission: RE | Admit: 2019-10-04 | Discharge: 2019-10-04 | Disposition: A | Discharge status: Home / Self Care | Payer: Medicare HMO | Source: Ambulatory Visit | Attending: Obstetrics and Gynecology | Admitting: Obstetrics and Gynecology

## 2019-10-04 ENCOUNTER — Other Ambulatory Visit: Payer: Self-pay

## 2019-10-04 DIAGNOSIS — Z1231 Encounter for screening mammogram for malignant neoplasm of breast: Secondary | ICD-10-CM | POA: Insufficient documentation

## 2019-10-25 ENCOUNTER — Other Ambulatory Visit: Payer: Self-pay

## 2019-10-25 ENCOUNTER — Other Ambulatory Visit: Payer: Self-pay | Admitting: *Deleted

## 2019-10-25 DIAGNOSIS — R0602 Shortness of breath: Secondary | ICD-10-CM | POA: Diagnosis not present

## 2019-10-25 DIAGNOSIS — I517 Cardiomegaly: Secondary | ICD-10-CM | POA: Diagnosis not present

## 2019-10-25 DIAGNOSIS — Z7189 Other specified counseling: Secondary | ICD-10-CM

## 2019-10-25 DIAGNOSIS — M791 Myalgia, unspecified site: Secondary | ICD-10-CM | POA: Diagnosis not present

## 2019-10-25 DIAGNOSIS — D72829 Elevated white blood cell count, unspecified: Secondary | ICD-10-CM

## 2019-10-25 DIAGNOSIS — C9590 Leukemia, unspecified not having achieved remission: Secondary | ICD-10-CM

## 2019-10-25 DIAGNOSIS — Z03818 Encounter for observation for suspected exposure to other biological agents ruled out: Secondary | ICD-10-CM | POA: Diagnosis not present

## 2019-10-25 DIAGNOSIS — M2550 Pain in unspecified joint: Secondary | ICD-10-CM | POA: Diagnosis not present

## 2019-10-25 DIAGNOSIS — R7989 Other specified abnormal findings of blood chemistry: Secondary | ICD-10-CM | POA: Diagnosis not present

## 2019-10-28 ENCOUNTER — Encounter: Payer: Self-pay | Admitting: *Deleted

## 2019-10-28 ENCOUNTER — Other Ambulatory Visit: Payer: Self-pay | Admitting: *Deleted

## 2019-10-28 ENCOUNTER — Inpatient Hospital Stay: Payer: Medicare HMO

## 2019-10-28 ENCOUNTER — Other Ambulatory Visit: Payer: Self-pay

## 2019-10-28 ENCOUNTER — Encounter: Payer: Self-pay | Admitting: Oncology

## 2019-10-28 ENCOUNTER — Inpatient Hospital Stay: Payer: Medicare HMO | Attending: Oncology | Admitting: Oncology

## 2019-10-28 VITALS — BP 102/40 | HR 54 | Temp 97.9°F | Resp 16 | Wt 134.0 lb

## 2019-10-28 DIAGNOSIS — R52 Pain, unspecified: Secondary | ICD-10-CM | POA: Diagnosis not present

## 2019-10-28 DIAGNOSIS — I2510 Atherosclerotic heart disease of native coronary artery without angina pectoris: Secondary | ICD-10-CM | POA: Insufficient documentation

## 2019-10-28 DIAGNOSIS — J9811 Atelectasis: Secondary | ICD-10-CM | POA: Diagnosis not present

## 2019-10-28 DIAGNOSIS — K1230 Oral mucositis (ulcerative), unspecified: Secondary | ICD-10-CM | POA: Diagnosis not present

## 2019-10-28 DIAGNOSIS — Z452 Encounter for adjustment and management of vascular access device: Secondary | ICD-10-CM | POA: Diagnosis not present

## 2019-10-28 DIAGNOSIS — R6521 Severe sepsis with septic shock: Secondary | ICD-10-CM | POA: Diagnosis not present

## 2019-10-28 DIAGNOSIS — K1231 Oral mucositis (ulcerative) due to antineoplastic therapy: Secondary | ICD-10-CM | POA: Diagnosis not present

## 2019-10-28 DIAGNOSIS — R531 Weakness: Secondary | ICD-10-CM | POA: Diagnosis not present

## 2019-10-28 DIAGNOSIS — I119 Hypertensive heart disease without heart failure: Secondary | ICD-10-CM | POA: Diagnosis not present

## 2019-10-28 DIAGNOSIS — R799 Abnormal finding of blood chemistry, unspecified: Secondary | ICD-10-CM | POA: Diagnosis present

## 2019-10-28 DIAGNOSIS — R5383 Other fatigue: Secondary | ICD-10-CM | POA: Insufficient documentation

## 2019-10-28 DIAGNOSIS — Z20822 Contact with and (suspected) exposure to covid-19: Secondary | ICD-10-CM | POA: Diagnosis not present

## 2019-10-28 DIAGNOSIS — C9210 Chronic myeloid leukemia, BCR/ABL-positive, not having achieved remission: Secondary | ICD-10-CM | POA: Diagnosis not present

## 2019-10-28 DIAGNOSIS — Z8616 Personal history of COVID-19: Secondary | ICD-10-CM | POA: Insufficient documentation

## 2019-10-28 DIAGNOSIS — Z66 Do not resuscitate: Secondary | ICD-10-CM | POA: Diagnosis not present

## 2019-10-28 DIAGNOSIS — G9340 Encephalopathy, unspecified: Secondary | ICD-10-CM | POA: Diagnosis not present

## 2019-10-28 DIAGNOSIS — D61818 Other pancytopenia: Secondary | ICD-10-CM | POA: Diagnosis not present

## 2019-10-28 DIAGNOSIS — R509 Fever, unspecified: Secondary | ICD-10-CM | POA: Insufficient documentation

## 2019-10-28 DIAGNOSIS — R0902 Hypoxemia: Secondary | ICD-10-CM | POA: Diagnosis not present

## 2019-10-28 DIAGNOSIS — G8929 Other chronic pain: Secondary | ICD-10-CM | POA: Diagnosis not present

## 2019-10-28 DIAGNOSIS — R0602 Shortness of breath: Secondary | ICD-10-CM | POA: Insufficient documentation

## 2019-10-28 DIAGNOSIS — Z515 Encounter for palliative care: Secondary | ICD-10-CM | POA: Diagnosis not present

## 2019-10-28 DIAGNOSIS — I081 Rheumatic disorders of both mitral and tricuspid valves: Secondary | ICD-10-CM | POA: Diagnosis not present

## 2019-10-28 DIAGNOSIS — C9200 Acute myeloblastic leukemia, not having achieved remission: Secondary | ICD-10-CM

## 2019-10-28 DIAGNOSIS — Z800 Family history of malignant neoplasm of digestive organs: Secondary | ICD-10-CM | POA: Insufficient documentation

## 2019-10-28 DIAGNOSIS — R070 Pain in throat: Secondary | ICD-10-CM | POA: Diagnosis not present

## 2019-10-28 DIAGNOSIS — E872 Acidosis: Secondary | ICD-10-CM | POA: Diagnosis not present

## 2019-10-28 DIAGNOSIS — R5081 Fever presenting with conditions classified elsewhere: Secondary | ICD-10-CM | POA: Diagnosis not present

## 2019-10-28 DIAGNOSIS — K521 Toxic gastroenteritis and colitis: Secondary | ICD-10-CM | POA: Diagnosis not present

## 2019-10-28 DIAGNOSIS — N179 Acute kidney failure, unspecified: Secondary | ICD-10-CM | POA: Diagnosis not present

## 2019-10-28 DIAGNOSIS — C9500 Acute leukemia of unspecified cell type not having achieved remission: Secondary | ICD-10-CM | POA: Diagnosis not present

## 2019-10-28 DIAGNOSIS — R530 Neoplastic (malignant) related fatigue: Secondary | ICD-10-CM | POA: Diagnosis not present

## 2019-10-28 DIAGNOSIS — D72829 Elevated white blood cell count, unspecified: Secondary | ICD-10-CM | POA: Diagnosis not present

## 2019-10-28 DIAGNOSIS — A419 Sepsis, unspecified organism: Secondary | ICD-10-CM | POA: Diagnosis not present

## 2019-10-28 DIAGNOSIS — E883 Tumor lysis syndrome: Secondary | ICD-10-CM | POA: Diagnosis not present

## 2019-10-28 DIAGNOSIS — I10 Essential (primary) hypertension: Secondary | ICD-10-CM | POA: Diagnosis not present

## 2019-10-28 DIAGNOSIS — I083 Combined rheumatic disorders of mitral, aortic and tricuspid valves: Secondary | ICD-10-CM | POA: Diagnosis not present

## 2019-10-28 DIAGNOSIS — Z8679 Personal history of other diseases of the circulatory system: Secondary | ICD-10-CM | POA: Diagnosis not present

## 2019-10-28 DIAGNOSIS — I272 Pulmonary hypertension, unspecified: Secondary | ICD-10-CM | POA: Diagnosis not present

## 2019-10-28 DIAGNOSIS — Z951 Presence of aortocoronary bypass graft: Secondary | ICD-10-CM | POA: Diagnosis not present

## 2019-10-28 DIAGNOSIS — R7881 Bacteremia: Secondary | ICD-10-CM | POA: Diagnosis not present

## 2019-10-28 DIAGNOSIS — Z993 Dependence on wheelchair: Secondary | ICD-10-CM | POA: Insufficient documentation

## 2019-10-28 DIAGNOSIS — R040 Epistaxis: Secondary | ICD-10-CM | POA: Diagnosis not present

## 2019-10-28 DIAGNOSIS — R410 Disorientation, unspecified: Secondary | ICD-10-CM | POA: Diagnosis not present

## 2019-10-28 DIAGNOSIS — I6389 Other cerebral infarction: Secondary | ICD-10-CM | POA: Diagnosis not present

## 2019-10-28 DIAGNOSIS — G4700 Insomnia, unspecified: Secondary | ICD-10-CM | POA: Diagnosis not present

## 2019-10-28 DIAGNOSIS — G250 Essential tremor: Secondary | ICD-10-CM | POA: Diagnosis not present

## 2019-10-28 DIAGNOSIS — D849 Immunodeficiency, unspecified: Secondary | ICD-10-CM | POA: Diagnosis not present

## 2019-10-28 DIAGNOSIS — I639 Cerebral infarction, unspecified: Secondary | ICD-10-CM | POA: Diagnosis not present

## 2019-10-28 DIAGNOSIS — R079 Chest pain, unspecified: Secondary | ICD-10-CM | POA: Diagnosis not present

## 2019-10-28 DIAGNOSIS — E119 Type 2 diabetes mellitus without complications: Secondary | ICD-10-CM | POA: Diagnosis not present

## 2019-10-28 DIAGNOSIS — R0609 Other forms of dyspnea: Secondary | ICD-10-CM

## 2019-10-28 DIAGNOSIS — R918 Other nonspecific abnormal finding of lung field: Secondary | ICD-10-CM | POA: Diagnosis not present

## 2019-10-28 DIAGNOSIS — J90 Pleural effusion, not elsewhere classified: Secondary | ICD-10-CM | POA: Diagnosis not present

## 2019-10-28 DIAGNOSIS — I6782 Cerebral ischemia: Secondary | ICD-10-CM | POA: Diagnosis not present

## 2019-10-28 DIAGNOSIS — D65 Disseminated intravascular coagulation [defibrination syndrome]: Secondary | ICD-10-CM | POA: Diagnosis not present

## 2019-10-28 DIAGNOSIS — B9562 Methicillin resistant Staphylococcus aureus infection as the cause of diseases classified elsewhere: Secondary | ICD-10-CM | POA: Diagnosis not present

## 2019-10-28 DIAGNOSIS — D709 Neutropenia, unspecified: Secondary | ICD-10-CM | POA: Diagnosis not present

## 2019-10-28 DIAGNOSIS — Z5111 Encounter for antineoplastic chemotherapy: Secondary | ICD-10-CM | POA: Diagnosis not present

## 2019-10-28 DIAGNOSIS — D61810 Antineoplastic chemotherapy induced pancytopenia: Secondary | ICD-10-CM | POA: Diagnosis not present

## 2019-10-28 DIAGNOSIS — R251 Tremor, unspecified: Secondary | ICD-10-CM | POA: Diagnosis not present

## 2019-10-28 LAB — COMPREHENSIVE METABOLIC PANEL
ALT: 37 U/L (ref 0–44)
AST: 105 U/L — ABNORMAL HIGH (ref 15–41)
Albumin: 3.5 g/dL (ref 3.5–5.0)
Alkaline Phosphatase: 127 U/L — ABNORMAL HIGH (ref 38–126)
Anion gap: 13 (ref 5–15)
BUN: 25 mg/dL — ABNORMAL HIGH (ref 8–23)
CO2: 18 mmol/L — ABNORMAL LOW (ref 22–32)
Calcium: 9.1 mg/dL (ref 8.9–10.3)
Chloride: 100 mmol/L (ref 98–111)
Creatinine, Ser: 1.22 mg/dL — ABNORMAL HIGH (ref 0.44–1.00)
GFR, Estimated: 42 mL/min — ABNORMAL LOW (ref >60)
Glucose, Bld: 147 mg/dL — ABNORMAL HIGH (ref 70–99)
Potassium: 4.1 mmol/L (ref 3.5–5.1)
Sodium: 131 mmol/L — ABNORMAL LOW (ref 135–145)
Total Bilirubin: 1 mg/dL (ref 0.3–1.2)
Total Protein: 7.2 g/dL (ref 6.5–8.1)

## 2019-10-28 LAB — URIC ACID: Uric Acid, Serum: 10.3 mg/dL — ABNORMAL HIGH (ref 2.5–7.1)

## 2019-10-28 LAB — CBC WITH DIFFERENTIAL/PLATELET
Abs Immature Granulocytes: 1.8 10*3/uL — ABNORMAL HIGH (ref 0.00–0.07)
Band Neutrophils: 3 %
Basophils Absolute: 0 10*3/uL (ref 0.0–0.1)
Basophils Relative: 0 %
Blasts: 43 %
Eosinophils Absolute: 0.9 10*3/uL — ABNORMAL HIGH (ref 0.0–0.5)
Eosinophils Relative: 1 %
HCT: 37.2 % (ref 36.0–46.0)
Hemoglobin: 13.2 g/dL (ref 12.0–15.0)
Lymphocytes Relative: 21 %
Lymphs Abs: 18.6 10*3/uL — ABNORMAL HIGH (ref 0.7–4.0)
MCH: 30.4 pg (ref 26.0–34.0)
MCHC: 35.5 g/dL (ref 30.0–36.0)
MCV: 85.7 fL (ref 80.0–100.0)
Metamyelocytes Relative: 1 %
Monocytes Absolute: 5.3 10*3/uL — ABNORMAL HIGH (ref 0.1–1.0)
Monocytes Relative: 6 %
Myelocytes: 1 %
Neutro Abs: 9.7 10*3/uL — ABNORMAL HIGH (ref 1.7–7.7)
Neutrophils Relative %: 8 %
Other: 16 %
Platelets: 78 10*3/uL — ABNORMAL LOW (ref 150–400)
RBC: 4.34 MIL/uL (ref 3.87–5.11)
RDW: 13.6 % (ref 11.5–15.5)
Smear Review: NORMAL
WBC: 88.5 10*3/uL (ref 4.0–10.5)
nRBC: 0.4 % — ABNORMAL HIGH (ref 0.0–0.2)

## 2019-10-28 LAB — LACTATE DEHYDROGENASE: LDH: 2061 U/L — ABNORMAL HIGH (ref 98–192)

## 2019-10-28 LAB — PROTIME-INR
INR: 1.4 — ABNORMAL HIGH (ref 0.8–1.2)
Prothrombin Time: 17 seconds — ABNORMAL HIGH (ref 11.4–15.2)

## 2019-10-28 LAB — APTT: aPTT: 41 seconds — ABNORMAL HIGH (ref 24–36)

## 2019-10-28 LAB — FIBRINOGEN: Fibrinogen: 138 mg/dL — ABNORMAL LOW (ref 210–475)

## 2019-10-28 MED ORDER — OXYCODONE HCL 5 MG PO TABS: 5.0000 mg | ORAL_TABLET | Freq: Four times a day (QID) | ORAL | 0 refills | Status: AC | PRN

## 2019-10-28 MED ORDER — HYDROXYUREA 500 MG PO CAPS: 1000.0000 mg | ORAL_CAPSULE | Freq: Every day | ORAL | 0 refills | Status: AC

## 2019-10-28 NOTE — Telephone Encounter (Signed)
Error - wrong patient for refill

## 2019-10-28 NOTE — Progress Notes (Signed)
Hematology/Oncology Consult note [facility] [phone] [phone]  Patient Care Team: Baxter Hire, MD as PCP - General (Internal Medicine)   Name of the patient: Sheena Rice  [id number]  09-01-1940    Reason for referral-new diagnosis of possible acute leukemia   Referring 74 [address] PA  Date of visit: 10/28/19   History of presenting illness-patient is a 79 year old female with a past medical history significant for Hypertension type 2 diabetes hyperlipidemia coronary artery disease who presented to her primary care provider on 10/25/2019 with symptoms of generalized weakness, exertional shortness of breath as well as diffuse body aches.  She has also been having a low-grade fever at home.  Patient was noted to have a white count of 15.9, H&H of 13.9/41 and a platelet count of 137.  She was noted to have about 21% blasts in her peripheral smear.  Today she is here with her daughter Sheena Rice.  Patient has been living with her husband and she has quickly taken a turn for worse over the last 2 days with progressive lethargy.  She spends most of her time in bed and barely able to walk to the bathroom.  She again reports generalized body aches and exertional shortness of breath.  She had a chest x-ray on 10/25/2019 as well and I do not have the reports for the same with me.  Her daughter tells me that she was found to have some capacity in her right lobe which was similar to what was seen on her x-ray back in October 2020 when she had Covid.  ECOG PS- 3  Pain scale- 4   Review of systems- Review of Systems  Constitutional: Positive for malaise/fatigue and weight loss. Negative for chills and fever.  HENT: Negative for congestion, ear discharge and nosebleeds.   Eyes: Negative for blurred vision.  Respiratory: Positive for shortness of breath. Negative for cough, hemoptysis, sputum production and wheezing.   Cardiovascular: Negative  for chest pain, palpitations, orthopnea and claudication.  Gastrointestinal: Negative for abdominal pain, blood in stool, constipation, diarrhea, heartburn, melena, nausea and vomiting.  Genitourinary: Negative for dysuria, flank pain, frequency, hematuria and urgency.  Musculoskeletal: Negative for back pain, joint pain and myalgias.  Skin: Negative for rash.  Neurological: Negative for dizziness, tingling, focal weakness, seizures, weakness and headaches.  Endo/Heme/Allergies: Does not bruise/bleed easily.  Psychiatric/Behavioral: Negative for depression and suicidal ideas. The patient does not have insomnia.     Allergies  Allergen Reactions  . Pravastatin Other (See Comments)    Muscle pain,"STATIN" Drugs    Patient Active Problem List   Diagnosis Date Noted  . Type 2 diabetes mellitus without complication, without long-term current use of insulin ([place]) 01/03/2018  . Digital mucous cyst 07/12/2017  . Pain in right hand 07/12/2017  . Chest pain 09/20/2016  . Hx of adenomatous colonic polyps 09/06/2016  . Coronary artery disease involving native coronary artery of native heart with unstable angina pectoris ([place])   . Chest pain, rule out acute myocardial infarction 03/31/2016  . S/P CABG x 4 01/04/2016  . Acute coronary syndrome ([place]) 12/30/2015  . Unstable angina ([place]) 12/29/2015  . Chronic fatigue syndrome 09/24/2013  . Chronic reflux esophagitis 09/24/2013  . Hyperlipidemia 09/24/2013  . Hypertension 09/24/2013  . Osteoporosis, post-menopausal 09/24/2013     Past Medical History:  Diagnosis Date  . Arthritis    OA  . Chronic fatigue   . Coronary artery disease   . GERD (gastroesophageal reflux disease)   .  Hx of adenomatous colonic polyps   . Hx of CABG   . Hyperlipidemia   . Hypertension   . Leukemia ([place])   . Osteoporosis   . Unstable angina [facility])      Past Surgical History:  Procedure Laterality Date  . APPENDECTOMY    . CARDIAC CATHETERIZATION Left  12/29/2015   Procedure: Left Heart Cath and Coronary Angiography;  Surgeon: Teodoro Spray, MD;  Location: [place] [facility];  Service: Cardiovascular;  Laterality: Left;  . CATARACT EXTRACTION    . CHOLECYSTECTOMY    . COLONOSCOPY  09/04/1998, 11/18/1993, 02/17/2003, 04/25/2006, 08/01/2011  . COLONOSCOPY WITH PROPOFOL N/A 04/28/2017   Procedure: COLONOSCOPY WITH PROPOFOL;  Surgeon: Manya Silvas, MD;  Location: [facility] ENDOSCOPY;  Service: Endoscopy;  Laterality: N/A;  . CORONARY ARTERY BYPASS GRAFT N/A 12/31/2015   Procedure: CORONARY ARTERY BYPASS GRAFTING (CABG), ON PUMP, TIMES FOUR, USING LEFT INTERNAL MAMMARY ARTERY, RIGHT GREATER SAPHENOUS VEIN HARVESTED ENDOSCOPICALLY;  Surgeon: [name], MD;  Location: [place];  Service: Open Heart Surgery;  Laterality: N/A;  -LIMA to LAD -SVG to DIAGONAL -SVG to OM -SVG to PDA  . CORONARY STENT INTERVENTION N/A 04/01/2016   Procedure: Coronary Stent Intervention;  Surgeon: Yolonda Kida, MD;  Location: [place] [facility];  Service: Cardiovascular;  Laterality: N/A;  . CORONARY STENT INTERVENTION N/A 04/02/2016   Procedure: Coronary Stent Intervention;  Surgeon: Burnell Blanks, MD;  Location: [place] [facility];  Service: Cardiovascular;  Laterality: N/A;  . ESOPHAGOGASTRODUODENOSCOPY    . LEFT HEART CATH AND CORONARY ANGIOGRAPHY Left 08/15/2017   Procedure: LEFT HEART CATH AND CORONARY ANGIOGRAPHY;  Surgeon: Teodoro Spray, MD;  Location: [place] [facility];  Service: Cardiovascular;  Laterality: Left;  . LEFT HEART CATH AND CORS/GRAFTS ANGIOGRAPHY N/A 04/01/2016   Procedure: Left Heart Cath and Cors/Grafts Angiography and possible PCI;  Surgeon: Yolonda Kida, MD;  Location: [place] [facility];  Service: Cardiovascular;  Laterality: N/A;  . TEE WITHOUT CARDIOVERSION N/A 12/31/2015   Procedure: TRANSESOPHAGEAL ECHOCARDIOGRAM (TEE);  Surgeon: [name], MD;  Location: [place];  Service: Open Heart Surgery;   Laterality: N/A;    Social History   Socioeconomic History  . Marital status: Married    Spouse name: Not on file  . Number of children: Not on file  . Years of education: Not on file  . Highest education level: Not on file  Occupational History  . Not on file  Tobacco Use  . Smoking status: Never Smoker  . Smokeless tobacco: Never Used  Vaping Use  . Vaping Use: Never used  Substance and Sexual Activity  . Alcohol use: No  . Drug use: No  . Sexual activity: Not on file  Other Topics Concern  . Not on file  Social History Narrative  . Not on file   Social Determinants of Health   Financial Resource Strain:   . Difficulty of Paying Living Expenses: Not on file  Food Insecurity:   . Worried About Charity fundraiser in the Last Year: Not on file  . Ran Out of Food in the Last Year: Not on file  Transportation Needs:   . Lack of Transportation (Medical): Not on file  . Lack of Transportation (Non-Medical): Not on file  Physical Activity:   . Days of Exercise per Week: Not on file  . Minutes of Exercise per Session: Not on file  Stress:   . Feeling of Stress : Not  on file  Social Connections:   . Frequency of Communication with Friends and Family: Not on file  . Frequency of Social Gatherings with Friends and Family: Not on file  . Attends Religious Services: Not on file  . Active Member of Clubs or Organizations: Not on file  . Attends Archivist Meetings: Not on file  . Marital Status: Not on file  Intimate Partner Violence:   . Fear of Current or Ex-Partner: Not on file  . Emotionally Abused: Not on file  . Physically Abused: Not on file  . Sexually Abused: Not on file     Family History  Problem Relation Age of Onset  . Colon cancer Sister   . Breast cancer Neg Hx      Current Outpatient Medications:  .  aspirin EC 81 MG EC tablet, Take 1 tablet (81 mg total) by mouth daily., Disp: 30 tablet, Rfl: 0 .  Blood Glucose Monitoring Suppl (FIFTY50  GLUCOSE METER 2.0) w/Device KIT, See admin instructions., Disp: , Rfl:  .  Cholecalciferol (VITAMIN D3) 5000 units CAPS, Take 5,000 Units by mouth daily. , Disp: , Rfl:  .  clopidogrel (PLAVIX) 75 MG tablet, Take 75 mg by mouth daily., Disp: , Rfl:  .  cyclobenzaprine (FLEXERIL) 10 MG tablet, Take 10 mg by mouth at bedtime. , Disp: , Rfl:  .  esomeprazole (NEXIUM) 20 MG capsule, Take 20 mg by mouth every morning. , Disp: , Rfl:  .  gemfibrozil (LOPID) 600 MG tablet, Take 600 mg by mouth 2 (two) times daily. , Disp: , Rfl:  .  isosorbide mononitrate (IMDUR) 30 MG 24 hr tablet, , Disp: , Rfl:  .  losartan (COZAAR) 25 MG tablet, Take 25 mg by mouth daily., Disp: , Rfl:  .  Magnesium Oxide 400 MG CAPS, Take 400 mg by mouth daily. , Disp: , Rfl:  .  metoprolol tartrate (LOPRESSOR) 25 MG tablet, Take 1 tablet (25 mg total) by mouth 2 (two) times daily., Disp: 60 tablet, Rfl: 0 .  nitroGLYCERIN (NITROSTAT) 0.4 MG SL tablet, Place 1 tablet (0.4 mg total) under the tongue every 5 (five) minutes as needed for chest pain., Disp: 30 tablet, Rfl: 0 .  rosuvastatin (CRESTOR) 10 MG tablet, Take 1 tablet (10 mg total) by mouth daily at 6 PM., Disp: 30 tablet, Rfl: 5 .  hydroxyurea (HYDREA) 500 MG capsule, Take 2 capsules (1,000 mg total) by mouth daily for 3 days. May take with food to minimize GI side effects., Disp: 6 capsule, Rfl: 0 .  oxyCODONE (OXY IR/ROXICODONE) 5 MG immediate release tablet, Take 1 tablet (5 mg total) by mouth every 6 (six) hours as needed for severe pain., Disp: 60 tablet, Rfl: 0 No current facility-administered medications for this visit.  Facility-Administered Medications Ordered in Other Visits:  .  0.9 %  sodium chloride infusion, 250 mL, Intravenous, PRN, Teodoro Spray, MD, New Bag at 04/28/17 [phone] .  [EXPIRED] 0.9% sodium chloride infusion, 3 mL/kg/hr, Intravenous, Continuous **FOLLOWED BY** 0.9% sodium chloride infusion, 1 mL/kg/hr, Intravenous, Continuous, Fath, Kenneth A, MD .   sodium chloride flush (NS) 0.9 % injection 3 mL, 3 mL, Intravenous, Q12H, Fath, Kenneth A, MD .  sodium chloride flush (NS) 0.9 % injection 3 mL, 3 mL, Intravenous, PRN, Teodoro Spray, MD   Physical exam:  Vitals:   10/28/19 1312  BP: (!) 102/40  Pulse: (!) 54  Resp: 16  Temp: 97.9 F (36.6 C)  Weight: 134 lb 0.6  oz (60.8 kg)   Physical Exam Constitutional:      Comments: Appears significantly deconditioned and fatigued.  Sitting in a wheelchair  Cardiovascular:     Rate and Rhythm: Normal rate and regular rhythm.     Heart sounds: Normal heart sounds.  Pulmonary:     Effort: Pulmonary effort is normal.     Breath sounds: Normal breath sounds.  Abdominal:     General: Bowel sounds are normal.     Palpations: Abdomen is soft.  Lymphadenopathy:     Comments: No palpable cervical, supraclavicular, axillary or inguinal adenopathy   Skin:    General: Skin is warm and dry.  Neurological:     Mental Status: She is alert and oriented to person, place, and time.        CMP Latest Ref Rng & Units 10/28/2019  Glucose 70 - 99 mg/dL 147(H)  BUN 8 - 23 mg/dL 25(H)  Creatinine 0.44 - 1.00 mg/dL 1.22(H)  Sodium 135 - 145 mmol/L 131(L)  Potassium 3.5 - 5.1 mmol/L 4.1  Chloride 98 - 111 mmol/L 100  CO2 22 - 32 mmol/L 18(L)  Calcium 8.9 - 10.3 mg/dL 9.1  Total Protein 6.5 - 8.1 g/dL 7.2  Total Bilirubin 0.3 - 1.2 mg/dL 1.0  Alkaline Phos 38 - 126 U/L 127(H)  AST 15 - 41 U/L 105(H)  ALT 0 - 44 U/L 37   CBC Latest Ref Rng & Units 10/28/2019  WBC 4.0 - 10.5 K/uL 88.5(HH)  Hemoglobin 12.0 - 15.0 g/dL 13.2  Hematocrit 36 - 46 % 37.2  Platelets 150 - 400 K/uL 78(L)    No images are attached to the encounter.  MM 3D SCREEN BREAST BILATERAL  Result Date: 10/04/2019 CLINICAL DATA:  Screening. EXAM: DIGITAL SCREENING BILATERAL MAMMOGRAM WITH TOMO AND CAD COMPARISON:  Previous exam(s). ACR Breast Density Category b: There are scattered areas of fibroglandular density. FINDINGS:  There are no findings suspicious for malignancy. Images were processed with CAD. IMPRESSION: No mammographic evidence of malignancy. A result letter of this screening mammogram will be mailed directly to the patient. RECOMMENDATION: Screening mammogram in one year. (Code:SM-B-01Y) BI-RADS CATEGORY  1: Negative. Electronically Signed   By: Valentino Saxon MD   On: 10/04/2019 13:13    Assessment and plan- Patient is a 79 y.o. female is with new diagnosis of probable acute leukemia  Patient was noted to have a white count of 15 with more than 20% blasts in her peripheral smear.  Surprisingly her hemoglobin was normal and her platelets were mildly low at 137.  Just in the last 2 days patient feels worse today and is significantly fatigued and deconditioned and spending most of her time in bed.  My initial plan was to get CBC with differential, CMP, LDH, PT PTT INR, fibrinogen, hepatitis B and C testing, flow cytometry and tumor lysis syndrome labs including G6PD levels and uric acid all of which have been ordered today.   Plan was to obtain a bone marrow biopsy in the next 1 or 2 days and start treatment for acute leukemia next week.  Salvage her with high-dose Hydrea 1 g/day for the next 3 days  Discussed poor prognosis associated with acute leukemia and at this time it is unclear if this is AML or ALL.  Discussed that without any treatment life expectancy would be in a matter of days to weeks.  For AML- With Vidaza alone overall survival can be upto 10 months.  Vidaza plus venetoclax OS can  be upto 15 months.    If this is AML she would not be a candidate for intensive induction chemotherapy.  Less intense regimen such as Vidaza plus venetoclax remain a consideration which can be done here at [facility].  However after the patient left the clinic her white cell count came back at 88 with an LDH of 2061 .  I called patient's daughter Sheena Rice who reports that patient continues to feel worse and is getting more  lethargic.  I therefore recommended that patient should go to [facility] ER to expedite her work-up including a bone marrow biopsy and possibly get her first treatment as an inpatient.  I did inform Dr. Dellis Filbert as well as the leukemia attending on call Dr. Levada Dy who are aware that she would be coming to the ER.  Patient is currently not clinically stable to receive treatment as an outpatient   Total face to face encounter time for this patient visit was 40 min.  Total non face to face encounter time for this patient on the day of the visit was 20 min. Time spent in reviewing outside records as well as discussing patients case with Dr. Janene Madeira from [facility]     Visit Diagnosis 1. Abnormal blood smear   2. Acute myeloid leukemia not having achieved remission ([place])     Dr. Randa Evens, MD, MPH [facility] at [facility] [phone] 10/28/2019 4:05 PM

## 2019-10-28 NOTE — Progress Notes (Signed)
Patient on schedule for [facility] 10/31/2019. Spoke with Beth/daughter on phone with pre procedure instructions given. Made aware to be here @ 0830, NPO after [place] prior to procedure as well as driver for discharge after procedure. Stated understanding.

## 2019-10-28 NOTE — Progress Notes (Signed)
Pt over 1 weeks time has drastically changed within 1 week. She is weak, severely fatigued. She has fallen x3 in last 2 days. Pt had family to walk with her. They are looking into walker and BSC. She is having joint and all over body a aches

## 2019-10-29 DIAGNOSIS — C9500 Acute leukemia of unspecified cell type not having achieved remission: Secondary | ICD-10-CM | POA: Diagnosis not present

## 2019-10-29 DIAGNOSIS — I083 Combined rheumatic disorders of mitral, aortic and tricuspid valves: Secondary | ICD-10-CM | POA: Diagnosis not present

## 2019-10-29 DIAGNOSIS — D65 Disseminated intravascular coagulation [defibrination syndrome]: Secondary | ICD-10-CM | POA: Diagnosis not present

## 2019-10-29 DIAGNOSIS — R530 Neoplastic (malignant) related fatigue: Secondary | ICD-10-CM | POA: Diagnosis not present

## 2019-10-29 DIAGNOSIS — C9210 Chronic myeloid leukemia, BCR/ABL-positive, not having achieved remission: Secondary | ICD-10-CM | POA: Diagnosis not present

## 2019-10-29 DIAGNOSIS — D61818 Other pancytopenia: Secondary | ICD-10-CM | POA: Diagnosis not present

## 2019-10-29 DIAGNOSIS — Z951 Presence of aortocoronary bypass graft: Secondary | ICD-10-CM | POA: Diagnosis not present

## 2019-10-30 DIAGNOSIS — E883 Tumor lysis syndrome: Secondary | ICD-10-CM | POA: Diagnosis not present

## 2019-10-30 DIAGNOSIS — C9200 Acute myeloblastic leukemia, not having achieved remission: Secondary | ICD-10-CM | POA: Diagnosis not present

## 2019-10-30 DIAGNOSIS — C9210 Chronic myeloid leukemia, BCR/ABL-positive, not having achieved remission: Secondary | ICD-10-CM | POA: Diagnosis not present

## 2019-10-30 DIAGNOSIS — G9340 Encephalopathy, unspecified: Secondary | ICD-10-CM | POA: Diagnosis not present

## 2019-10-30 DIAGNOSIS — R530 Neoplastic (malignant) related fatigue: Secondary | ICD-10-CM | POA: Diagnosis not present

## 2019-10-30 DIAGNOSIS — D65 Disseminated intravascular coagulation [defibrination syndrome]: Secondary | ICD-10-CM | POA: Diagnosis not present

## 2019-10-31 ENCOUNTER — Ambulatory Visit
Admission: RE | Admit: 2019-10-31 | Discharge: 2019-10-31 | Disposition: A | Discharge status: Home / Self Care | Payer: Medicare HMO | Source: Ambulatory Visit | Attending: Oncology | Admitting: Oncology

## 2019-10-31 DIAGNOSIS — C9200 Acute myeloblastic leukemia, not having achieved remission: Secondary | ICD-10-CM | POA: Diagnosis not present

## 2019-10-31 DIAGNOSIS — D65 Disseminated intravascular coagulation [defibrination syndrome]: Secondary | ICD-10-CM | POA: Diagnosis not present

## 2019-10-31 DIAGNOSIS — I6782 Cerebral ischemia: Secondary | ICD-10-CM | POA: Diagnosis not present

## 2019-10-31 DIAGNOSIS — R410 Disorientation, unspecified: Secondary | ICD-10-CM | POA: Diagnosis not present

## 2019-10-31 DIAGNOSIS — D61818 Other pancytopenia: Secondary | ICD-10-CM | POA: Diagnosis not present

## 2019-10-31 DIAGNOSIS — R251 Tremor, unspecified: Secondary | ICD-10-CM | POA: Diagnosis not present

## 2019-11-01 DIAGNOSIS — C9200 Acute myeloblastic leukemia, not having achieved remission: Secondary | ICD-10-CM | POA: Diagnosis not present

## 2019-11-01 DIAGNOSIS — D61818 Other pancytopenia: Secondary | ICD-10-CM | POA: Diagnosis not present

## 2019-11-01 DIAGNOSIS — G250 Essential tremor: Secondary | ICD-10-CM | POA: Diagnosis not present

## 2019-11-01 DIAGNOSIS — I639 Cerebral infarction, unspecified: Secondary | ICD-10-CM | POA: Diagnosis not present

## 2019-11-01 DIAGNOSIS — Z5111 Encounter for antineoplastic chemotherapy: Secondary | ICD-10-CM | POA: Diagnosis not present

## 2019-11-01 DIAGNOSIS — Z8679 Personal history of other diseases of the circulatory system: Secondary | ICD-10-CM | POA: Diagnosis not present

## 2019-11-01 DIAGNOSIS — D65 Disseminated intravascular coagulation [defibrination syndrome]: Secondary | ICD-10-CM | POA: Diagnosis not present

## 2019-11-01 DIAGNOSIS — G8929 Other chronic pain: Secondary | ICD-10-CM | POA: Diagnosis not present

## 2019-11-02 DIAGNOSIS — D65 Disseminated intravascular coagulation [defibrination syndrome]: Secondary | ICD-10-CM | POA: Diagnosis not present

## 2019-11-02 DIAGNOSIS — K1230 Oral mucositis (ulcerative), unspecified: Secondary | ICD-10-CM | POA: Diagnosis not present

## 2019-11-02 DIAGNOSIS — C9200 Acute myeloblastic leukemia, not having achieved remission: Secondary | ICD-10-CM | POA: Diagnosis not present

## 2019-11-02 DIAGNOSIS — D61818 Other pancytopenia: Secondary | ICD-10-CM | POA: Diagnosis not present

## 2019-11-02 DIAGNOSIS — Z5111 Encounter for antineoplastic chemotherapy: Secondary | ICD-10-CM | POA: Diagnosis not present

## 2019-11-02 DIAGNOSIS — J9811 Atelectasis: Secondary | ICD-10-CM | POA: Diagnosis not present

## 2019-11-03 DIAGNOSIS — Z5111 Encounter for antineoplastic chemotherapy: Secondary | ICD-10-CM | POA: Diagnosis not present

## 2019-11-03 DIAGNOSIS — R040 Epistaxis: Secondary | ICD-10-CM | POA: Diagnosis not present

## 2019-11-03 DIAGNOSIS — R070 Pain in throat: Secondary | ICD-10-CM | POA: Diagnosis not present

## 2019-11-03 DIAGNOSIS — C9200 Acute myeloblastic leukemia, not having achieved remission: Secondary | ICD-10-CM | POA: Diagnosis not present

## 2019-11-03 DIAGNOSIS — I10 Essential (primary) hypertension: Secondary | ICD-10-CM | POA: Diagnosis not present

## 2019-11-04 DIAGNOSIS — C9200 Acute myeloblastic leukemia, not having achieved remission: Secondary | ICD-10-CM | POA: Diagnosis not present

## 2019-11-04 DIAGNOSIS — Z5111 Encounter for antineoplastic chemotherapy: Secondary | ICD-10-CM | POA: Diagnosis not present

## 2019-11-04 DIAGNOSIS — D61818 Other pancytopenia: Secondary | ICD-10-CM | POA: Diagnosis not present

## 2019-11-04 DIAGNOSIS — R040 Epistaxis: Secondary | ICD-10-CM | POA: Diagnosis not present

## 2019-11-05 DIAGNOSIS — G4700 Insomnia, unspecified: Secondary | ICD-10-CM | POA: Diagnosis not present

## 2019-11-05 DIAGNOSIS — Z5111 Encounter for antineoplastic chemotherapy: Secondary | ICD-10-CM | POA: Diagnosis not present

## 2019-11-05 DIAGNOSIS — C9200 Acute myeloblastic leukemia, not having achieved remission: Secondary | ICD-10-CM | POA: Diagnosis not present

## 2019-11-05 DIAGNOSIS — E872 Acidosis: Secondary | ICD-10-CM | POA: Diagnosis not present

## 2019-11-06 ENCOUNTER — Ambulatory Visit: Payer: Medicare HMO

## 2019-11-06 DIAGNOSIS — Z5111 Encounter for antineoplastic chemotherapy: Secondary | ICD-10-CM | POA: Diagnosis not present

## 2019-11-06 DIAGNOSIS — R509 Fever, unspecified: Secondary | ICD-10-CM | POA: Diagnosis not present

## 2019-11-06 DIAGNOSIS — J90 Pleural effusion, not elsewhere classified: Secondary | ICD-10-CM | POA: Diagnosis not present

## 2019-11-06 DIAGNOSIS — R918 Other nonspecific abnormal finding of lung field: Secondary | ICD-10-CM | POA: Diagnosis not present

## 2019-11-06 DIAGNOSIS — C9200 Acute myeloblastic leukemia, not having achieved remission: Secondary | ICD-10-CM | POA: Diagnosis not present

## 2019-11-06 DIAGNOSIS — D709 Neutropenia, unspecified: Secondary | ICD-10-CM | POA: Diagnosis not present

## 2019-11-06 DIAGNOSIS — C9210 Chronic myeloid leukemia, BCR/ABL-positive, not having achieved remission: Secondary | ICD-10-CM | POA: Diagnosis not present

## 2019-11-06 DIAGNOSIS — R5081 Fever presenting with conditions classified elsewhere: Secondary | ICD-10-CM | POA: Diagnosis not present

## 2019-11-07 DIAGNOSIS — R410 Disorientation, unspecified: Secondary | ICD-10-CM | POA: Diagnosis not present

## 2019-11-07 DIAGNOSIS — D72829 Elevated white blood cell count, unspecified: Secondary | ICD-10-CM | POA: Diagnosis not present

## 2019-11-07 DIAGNOSIS — D709 Neutropenia, unspecified: Secondary | ICD-10-CM | POA: Diagnosis not present

## 2019-11-07 DIAGNOSIS — C9500 Acute leukemia of unspecified cell type not having achieved remission: Secondary | ICD-10-CM | POA: Diagnosis not present

## 2019-11-07 DIAGNOSIS — Z515 Encounter for palliative care: Secondary | ICD-10-CM | POA: Diagnosis not present

## 2019-11-07 DIAGNOSIS — D61818 Other pancytopenia: Secondary | ICD-10-CM | POA: Diagnosis not present

## 2019-11-07 DIAGNOSIS — B9562 Methicillin resistant Staphylococcus aureus infection as the cause of diseases classified elsewhere: Secondary | ICD-10-CM | POA: Diagnosis not present

## 2019-11-07 DIAGNOSIS — Z452 Encounter for adjustment and management of vascular access device: Secondary | ICD-10-CM | POA: Diagnosis not present

## 2019-11-07 DIAGNOSIS — C9200 Acute myeloblastic leukemia, not having achieved remission: Secondary | ICD-10-CM | POA: Diagnosis not present

## 2019-11-07 DIAGNOSIS — R070 Pain in throat: Secondary | ICD-10-CM | POA: Diagnosis not present

## 2019-11-07 DIAGNOSIS — D849 Immunodeficiency, unspecified: Secondary | ICD-10-CM | POA: Diagnosis not present

## 2019-11-07 DIAGNOSIS — I272 Pulmonary hypertension, unspecified: Secondary | ICD-10-CM | POA: Diagnosis not present

## 2019-11-07 DIAGNOSIS — K1231 Oral mucositis (ulcerative) due to antineoplastic therapy: Secondary | ICD-10-CM | POA: Diagnosis not present

## 2019-11-07 DIAGNOSIS — J90 Pleural effusion, not elsewhere classified: Secondary | ICD-10-CM | POA: Diagnosis not present

## 2019-11-07 DIAGNOSIS — Z66 Do not resuscitate: Secondary | ICD-10-CM | POA: Diagnosis not present

## 2019-11-07 DIAGNOSIS — R918 Other nonspecific abnormal finding of lung field: Secondary | ICD-10-CM | POA: Diagnosis not present

## 2019-11-07 DIAGNOSIS — I081 Rheumatic disorders of both mitral and tricuspid valves: Secondary | ICD-10-CM | POA: Diagnosis not present

## 2019-11-07 DIAGNOSIS — R7881 Bacteremia: Secondary | ICD-10-CM | POA: Diagnosis not present

## 2019-11-08 DIAGNOSIS — R7881 Bacteremia: Secondary | ICD-10-CM | POA: Diagnosis not present

## 2019-11-08 DIAGNOSIS — R0902 Hypoxemia: Secondary | ICD-10-CM | POA: Diagnosis not present

## 2019-11-08 DIAGNOSIS — J9811 Atelectasis: Secondary | ICD-10-CM | POA: Diagnosis not present

## 2019-11-08 DIAGNOSIS — Z515 Encounter for palliative care: Secondary | ICD-10-CM | POA: Diagnosis not present

## 2019-11-08 DIAGNOSIS — E872 Acidosis: Secondary | ICD-10-CM | POA: Diagnosis not present

## 2019-11-08 DIAGNOSIS — C9200 Acute myeloblastic leukemia, not having achieved remission: Secondary | ICD-10-CM | POA: Diagnosis not present

## 2019-11-08 DIAGNOSIS — B9562 Methicillin resistant Staphylococcus aureus infection as the cause of diseases classified elsewhere: Secondary | ICD-10-CM | POA: Diagnosis not present

## 2019-11-08 DIAGNOSIS — C9500 Acute leukemia of unspecified cell type not having achieved remission: Secondary | ICD-10-CM | POA: Diagnosis not present

## 2019-11-08 DIAGNOSIS — D61818 Other pancytopenia: Secondary | ICD-10-CM | POA: Diagnosis not present

## 2019-11-08 DIAGNOSIS — K1231 Oral mucositis (ulcerative) due to antineoplastic therapy: Secondary | ICD-10-CM | POA: Diagnosis not present

## 2019-11-08 DIAGNOSIS — N179 Acute kidney failure, unspecified: Secondary | ICD-10-CM | POA: Diagnosis not present

## 2019-11-09 DIAGNOSIS — E872 Acidosis: Secondary | ICD-10-CM | POA: Diagnosis not present

## 2019-11-09 DIAGNOSIS — C9200 Acute myeloblastic leukemia, not having achieved remission: Secondary | ICD-10-CM | POA: Diagnosis not present

## 2019-11-09 DIAGNOSIS — R7881 Bacteremia: Secondary | ICD-10-CM | POA: Diagnosis not present

## 2019-11-09 DIAGNOSIS — N179 Acute kidney failure, unspecified: Secondary | ICD-10-CM | POA: Diagnosis not present

## 2019-11-18 ENCOUNTER — Encounter: Payer: Self-pay | Admitting: Dermatology

## 2019-11-18 DEATH — deceased

## 2020-11-24 ENCOUNTER — Telehealth: Payer: Self-pay | Admitting: Oncology

## 2020-11-24 NOTE — Telephone Encounter (Signed)
Patients daughter Eustaquio Maize called very upset because she continues to get notifications from [facility] reminding her of an overdue mammogram when the patient is deceased.  I explained that since she died at [facility] our system may have not been updated.  She  states that she also called patient relations 3 times with no response.  I apologized, updated the patient status and notified the daughter that the patient is now marked as deceased in the system.

## 2022-05-01 IMAGING — MG DIGITAL SCREENING BILAT W/ TOMO W/ CAD
8 series · 9 of 24 positions shown · non-contrast
Comparison: Previous exam(s).

CLINICAL DATA: Screening.

EXAM:
DIGITAL SCREENING BILATERAL MAMMOGRAM WITH TOMO AND CAD

[L CC synth-2D]
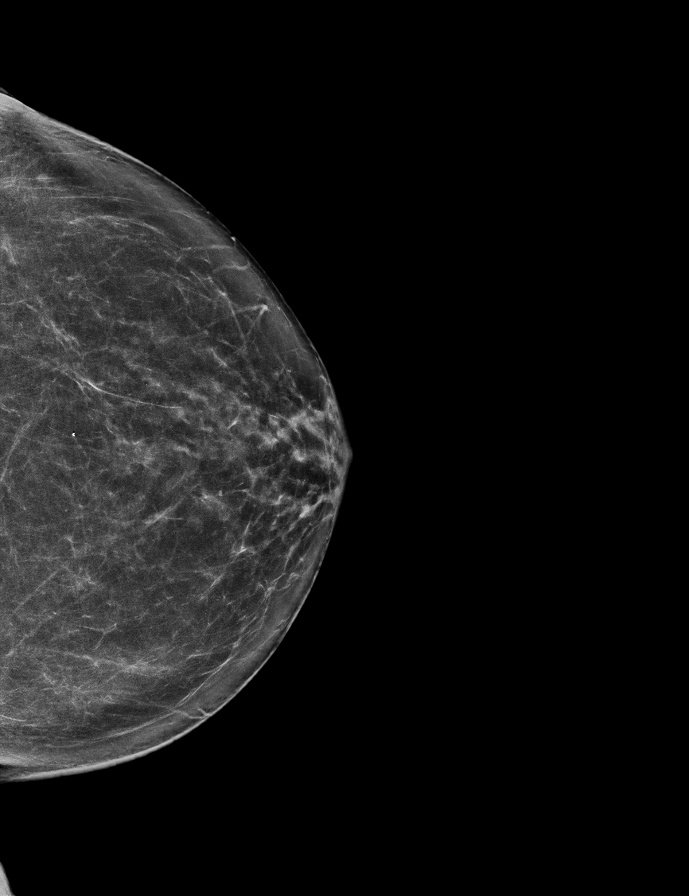

[R CC synth-2D]
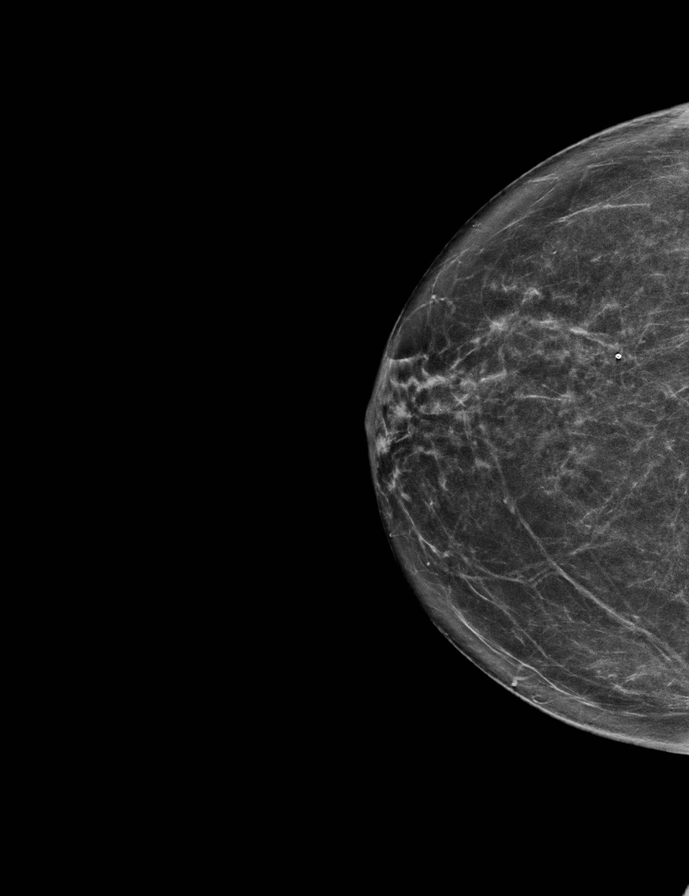

[L MLO synth-2D]
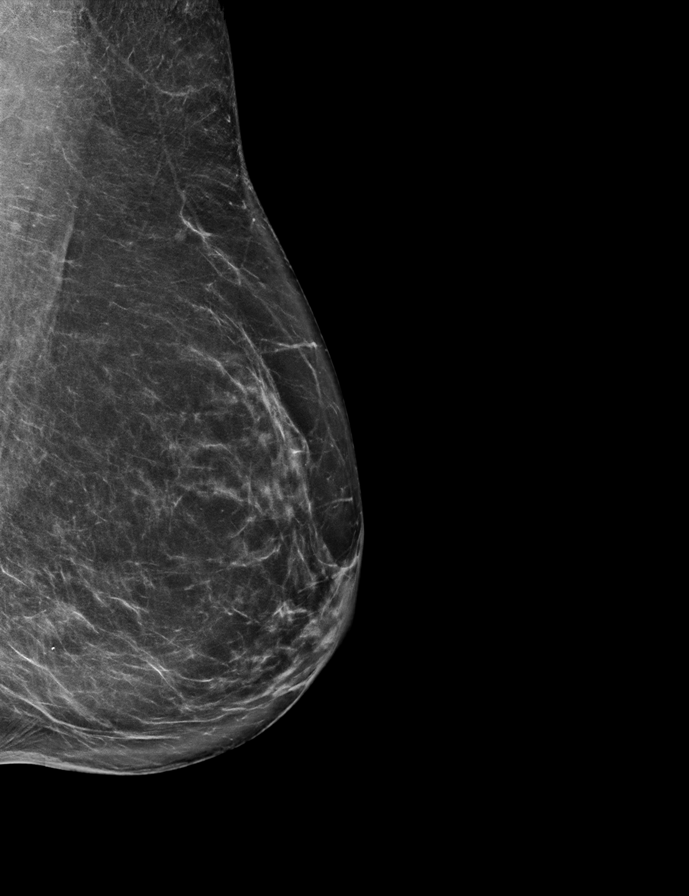

[R MLO synth-2D]
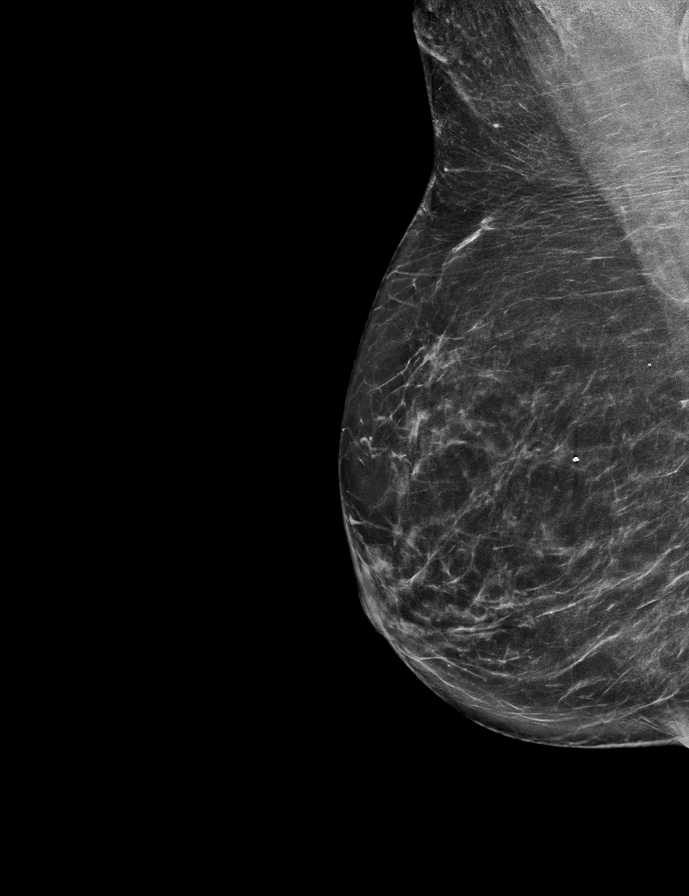

[L MLO tomo · 2 of 72 frames shown]
[frame 24/72]
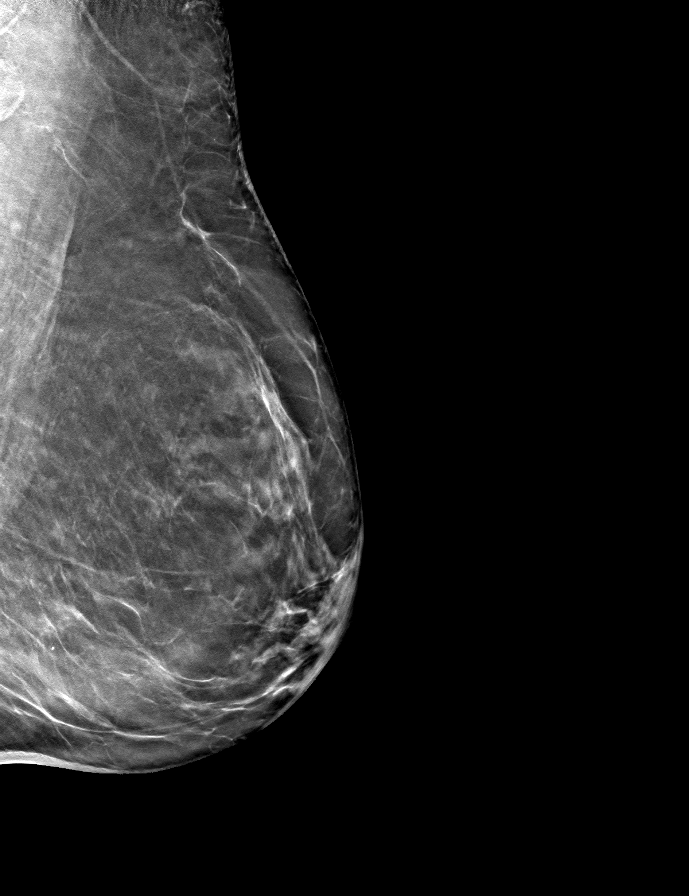
[frame 37/72]
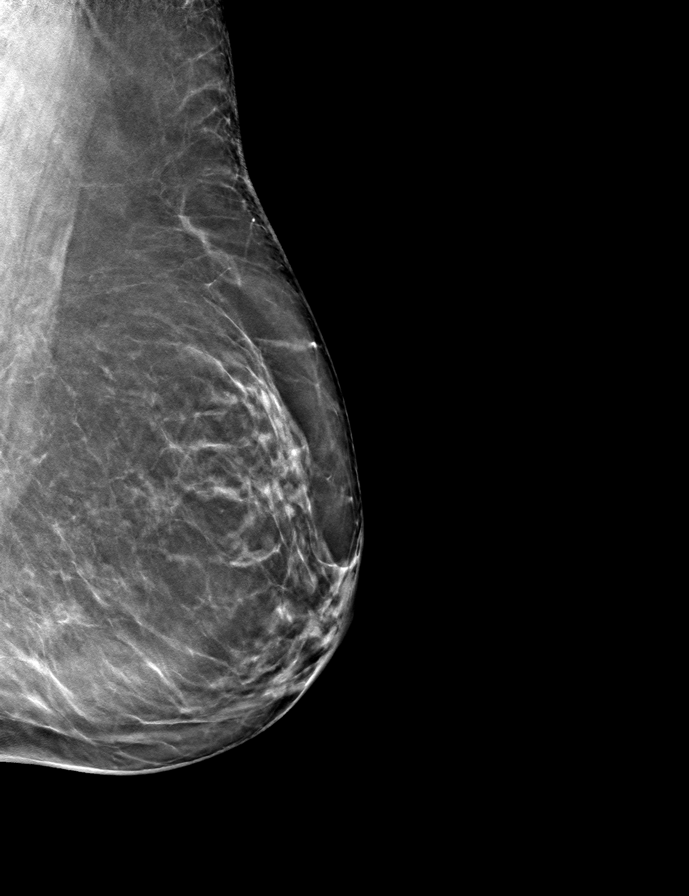

[R MLO tomo · tomo slice 35/70.0]
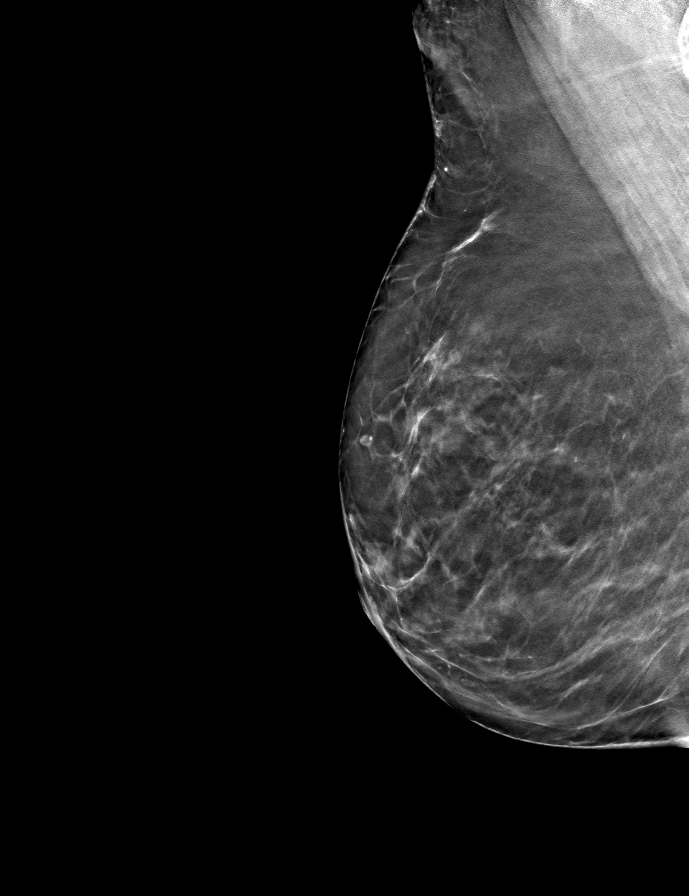

[R CC tomo · tomo slice 32/63.0]
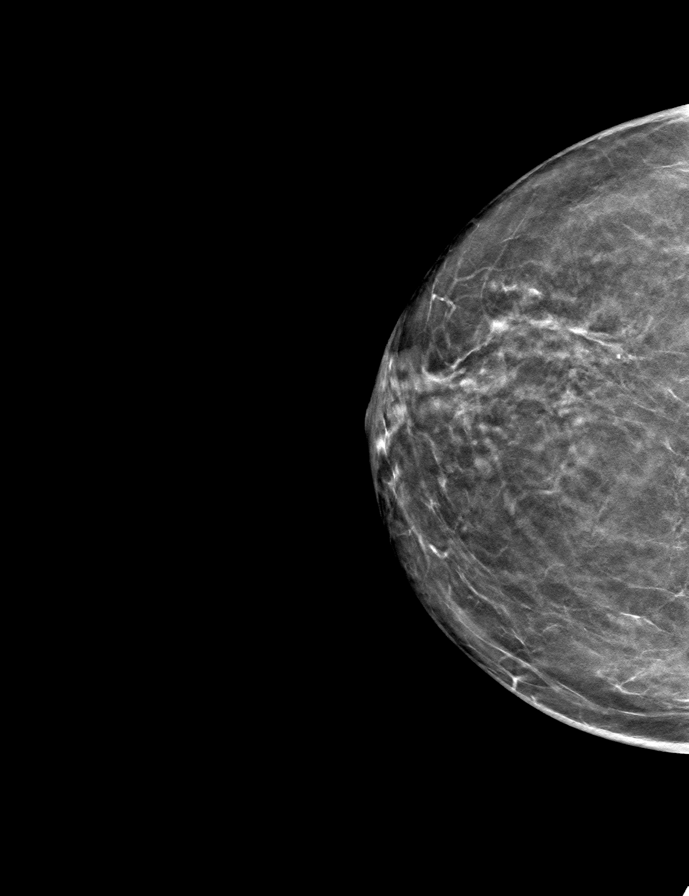

[L CC tomo · tomo slice 34/67.0]
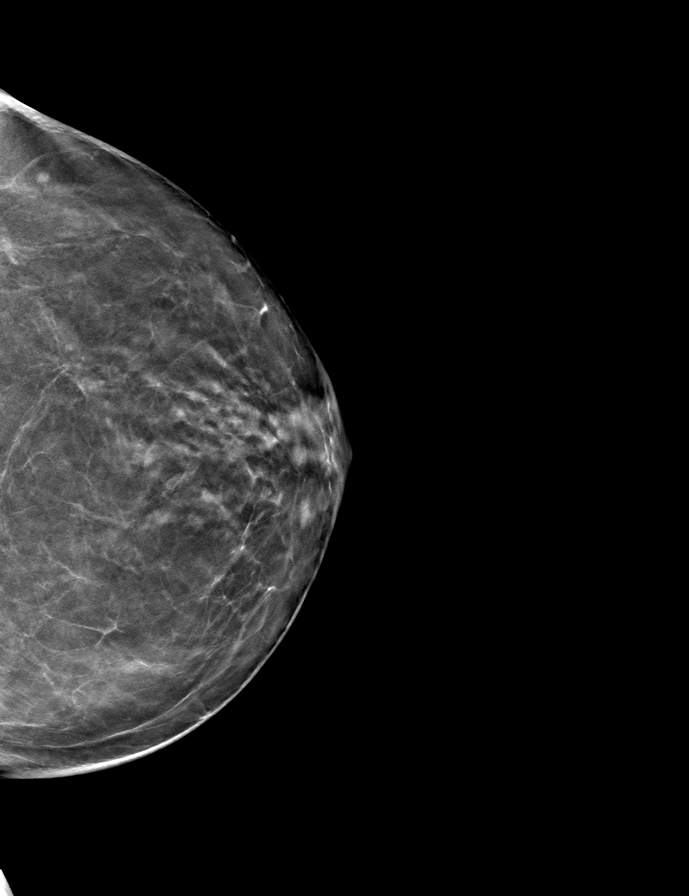

[9 of 24 positions shown; findings below may reference images not displayed]

ACR Breast Density Category b: There are scattered areas of
fibroglandular density.
FINDINGS: There are no findings suspicious for malignancy. Images were
processed with CAD.
IMPRESSION: No mammographic evidence of malignancy. A result letter of this
screening mammogram will be mailed directly to the patient.

RECOMMENDATION:
Screening mammogram in one year. (Code:[id number])

BI-RADS CATEGORY  1: Negative.
